# Patient Record
Sex: Female | Born: 1950 | Race: White | Hispanic: No | Marital: Single | Smoking: Never smoker
Health system: Southern US, Community
[De-identification: ages and names within clinical notes are randomized; demographics above are authoritative.]

## PROBLEM LIST (undated history)

## (undated) DIAGNOSIS — K219 Gastro-esophageal reflux disease without esophagitis: Secondary | ICD-10-CM

## (undated) DIAGNOSIS — D369 Benign neoplasm, unspecified site: Secondary | ICD-10-CM

## (undated) DIAGNOSIS — C8400 Mycosis fungoides, unspecified site: Secondary | ICD-10-CM

## (undated) DIAGNOSIS — M8580 Other specified disorders of bone density and structure, unspecified site: Secondary | ICD-10-CM

## (undated) DIAGNOSIS — I10 Essential (primary) hypertension: Secondary | ICD-10-CM

## (undated) DIAGNOSIS — Z8489 Family history of other specified conditions: Secondary | ICD-10-CM

## (undated) DIAGNOSIS — K5730 Diverticulosis of large intestine without perforation or abscess without bleeding: Secondary | ICD-10-CM

## (undated) DIAGNOSIS — C8590 Non-Hodgkin lymphoma, unspecified, unspecified site: Secondary | ICD-10-CM

## (undated) DIAGNOSIS — C801 Malignant (primary) neoplasm, unspecified: Secondary | ICD-10-CM

## (undated) DIAGNOSIS — Z8679 Personal history of other diseases of the circulatory system: Secondary | ICD-10-CM

## (undated) DIAGNOSIS — E785 Hyperlipidemia, unspecified: Secondary | ICD-10-CM

## (undated) DIAGNOSIS — C20 Malignant neoplasm of rectum: Secondary | ICD-10-CM

## (undated) HISTORY — DX: Benign neoplasm, unspecified site: D36.9

## (undated) HISTORY — PX: BREAST BIOPSY: SHX20

## (undated) HISTORY — DX: Personal history of other diseases of the circulatory system: Z86.79

## (undated) HISTORY — DX: Non-Hodgkin lymphoma, unspecified, unspecified site: C85.90

## (undated) HISTORY — DX: Mycosis fungoides, unspecified site: C84.00

## (undated) HISTORY — DX: Malignant neoplasm of rectum: C20

## (undated) HISTORY — DX: Essential (primary) hypertension: I10

## (undated) HISTORY — PX: FOOT SURGERY: SHX648

## (undated) HISTORY — DX: Other specified disorders of bone density and structure, unspecified site: M85.80

---

## 2005-02-09 ENCOUNTER — Emergency Department (HOSPITAL_COMMUNITY): Admission: EM | Admit: 2005-02-09 | Discharge: 2005-02-09 | Payer: Self-pay | Admitting: Family Medicine

## 2005-07-01 ENCOUNTER — Encounter (INDEPENDENT_AMBULATORY_CARE_PROVIDER_SITE_OTHER): Payer: Self-pay | Admitting: Specialist

## 2005-07-01 ENCOUNTER — Ambulatory Visit (HOSPITAL_COMMUNITY): Admission: RE | Admit: 2005-07-01 | Discharge: 2005-07-01 | Payer: Self-pay | Admitting: *Deleted

## 2005-11-13 ENCOUNTER — Emergency Department (HOSPITAL_COMMUNITY): Admission: EM | Admit: 2005-11-13 | Discharge: 2005-11-13 | Payer: Self-pay | Admitting: Emergency Medicine

## 2006-02-11 ENCOUNTER — Emergency Department (HOSPITAL_COMMUNITY): Admission: EM | Admit: 2006-02-11 | Discharge: 2006-02-11 | Payer: Self-pay | Admitting: Family Medicine

## 2006-09-21 ENCOUNTER — Emergency Department (HOSPITAL_COMMUNITY): Admission: EM | Admit: 2006-09-21 | Discharge: 2006-09-21 | Payer: Self-pay | Admitting: Family Medicine

## 2007-06-03 ENCOUNTER — Emergency Department (HOSPITAL_COMMUNITY): Admission: EM | Admit: 2007-06-03 | Discharge: 2007-06-03 | Payer: Self-pay | Admitting: Family Medicine

## 2008-06-23 ENCOUNTER — Ambulatory Visit (HOSPITAL_COMMUNITY): Admission: RE | Admit: 2008-06-23 | Discharge: 2008-06-23 | Payer: Self-pay | Admitting: *Deleted

## 2008-06-23 ENCOUNTER — Encounter (INDEPENDENT_AMBULATORY_CARE_PROVIDER_SITE_OTHER): Payer: Self-pay | Admitting: *Deleted

## 2009-06-13 ENCOUNTER — Emergency Department (HOSPITAL_COMMUNITY): Admission: EM | Admit: 2009-06-13 | Discharge: 2009-06-13 | Payer: Self-pay | Admitting: Emergency Medicine

## 2011-03-22 NOTE — Op Note (Signed)
Alexa [name], Alexa [name]              ACCOUNT NO.:  [id number]   MEDICAL RECORD NO.:  [id number]          PATIENT TYPE:  AMB   LOCATION:  ENDO                         FACILITY:  [facility]   PHYSICIAN:  Georgiana Spinner, M.D.    DATE OF BIRTH:  [birth year]   DATE OF PROCEDURE:  DATE OF DISCHARGE:                               OPERATIVE REPORT   PROCEDURE:  Colonoscopy with polypectomy and biopsy.   INDICATIONS:  Colon polyps.   ANESTHESIA:  Fentanyl 100 mcg, Versed 9 mg.   PROCEDURE:  With the patient mildly sedated in the left lateral  decubitus position the Pentax videoscopic colonoscope was inserted in  the rectum, passed under direct vision to the cecum identified by  ileocecal valve and appendiceal orifice both of which were photographed.  From this point the colonoscope was slowly withdrawn taking  circumferential views of colonic mucosa, stopping at the splenic flexure  where a polyp was seen, photographed and removed using snare cautery  technique setting of 20/150 blended current.  Tissue was retrieved by  suctioning it through the endoscope into a tissue trap.  We next stopped  in the rectum proximally where another polyp was seen.  It too was  photographed and it was removed using hot biopsy forceps technique with  the same setting.  The endoscope was withdrawn to the rectum which  appeared normal and  direct showed hemorrhoids on retroflexed view.  The  endoscope was straightened and withdrawn.  The patient's vital signs and  pulse oximeter remained stable.  The patient tolerated procedure well  without apparent complication.   FINDINGS:  1. Internal hemorrhoids.  2. Polyp of rectum proximally and splenic flexure.  3. Await biopsy report.  4. The patient will call me for results and follow-up with me as an      outpatient.           ______________________________  Georgiana Spinner, M.D.     GMO/MEDQ  D:  06/23/2008  T:  06/23/2008  Job:  [id number]

## 2011-03-25 NOTE — Op Note (Signed)
Alexa [name], Alexa [name]              ACCOUNT NO.:  [id number]   MEDICAL RECORD NO.:  [id number]          PATIENT TYPE:  AMB   LOCATION:  ENDO                         FACILITY:  [facility]   PHYSICIAN:  Georgiana Spinner, M.D.    DATE OF BIRTH:  08/03/1951   DATE OF PROCEDURE:  07/01/2005  DATE OF DISCHARGE:                                 OPERATIVE REPORT   PROCEDURE:  Colonoscopy with biopsy.   INDICATIONS:  Colon polyps.   ANESTHESIA:  Demerol 50, Versed 5 mg.   DESCRIPTION OF PROCEDURE:  With the patient mildly sedated in the left  lateral decubitus position, the Olympus videoscopic colonoscope was inserted  in the rectum and with pressure applied, we reached past the colonoscope  under direct vision to the cecum. The cecum was identified by the ileocecal  valve and appendiceal orifice both of which were photographed. In the cecum  was a polyp that was photographed that was linear and it was removed using  hot biopsy forceps technique in two pieces. The endoscope was then withdrawn  taking circumferential views of the remaining colonic mucosa stopping then  only in the rectum which appeared normal on direct and showed hemorrhoids on  retroflexed view. The endoscope was straightened and withdrawn. The  patient's vital signs and pulse oximeter remained stable. The patient  tolerated the procedure well without apparent complication.   FINDINGS:  Polyp of cecum. Internal hemorrhoids otherwise an unremarkable  examination.   PLAN:  Await biopsy report. The patient will call me for results and follow-  up with me as an outpatient.           ______________________________  Georgiana Spinner, M.D.     GMO/MEDQ  D:  07/01/2005  T:  07/01/2005  Job:  [id number]

## 2011-05-04 ENCOUNTER — Encounter: Payer: Self-pay | Admitting: Gastroenterology

## 2011-06-14 ENCOUNTER — Ambulatory Visit (INDEPENDENT_AMBULATORY_CARE_PROVIDER_SITE_OTHER): Payer: BC Managed Care – PPO | Admitting: Gastroenterology

## 2011-06-14 ENCOUNTER — Encounter: Payer: Self-pay | Admitting: Gastroenterology

## 2011-06-14 VITALS — BP 134/82 | HR 64 | Ht 64.0 in | Wt 149.0 lb

## 2011-06-14 DIAGNOSIS — Z86010 Personal history of colonic polyps: Secondary | ICD-10-CM

## 2011-06-14 NOTE — Progress Notes (Signed)
HPI: This is a  very pleasant, intelligent 60 year old woman who has had 2 previous colonoscopies. These were done by Dr. Virginia Rochester in 2006 he removed a cecal adenoma that was not given measurements on his colonoscopy report. He repeated colonoscopy 3 years later and removed one splenic flexure adenoma with snare cautery. This was also not given measurements on his colonoscopy report but the past biologist size it at less than 1/2 cm. Another small polyp was removed from her colon in 2009 and this was removed by hot biopsy forceps so I expect it was quite small. She was told to have repeat colonoscopy at a 3 year interval. She is here to discuss those recommendations since her previous gastroenterologist has moved from town.Alexa [name]     No FH of colon cancer.  No bowel changes.  Stable weight.      Review of systems: Pertinent positive and negative review of systems were noted in the above HPI section.  All other review of systems was otherwise negative.   Past Medical History  Diagnosis Date  . Adenomatous polyp   . Hypertension     Past Surgical History  Procedure Date  . Foot surgery     Right      reports that she has never smoked. She has never used smokeless tobacco. She reports that she drinks alcohol. She reports that she does not use illicit drugs.  family history is negative for Colon cancer.    Current Medications, Allergies were all reviewed with the patient via [facility] electronic medical record system.    Physical Exam: BP 134/82  Pulse 64  Ht 5\' 4"  (1.626 m)  Wt 149 lb (67.586 kg)  BMI 25.58 kg/m2 Constitutional: generally well-appearing Psychiatric: alert and oriented x3 Eyes: extraocular movements intact Mouth: oral pharynx moist, no lesions Neck: supple no lymphadenopathy Cardiovascular: heart regular rate and rhythm Lungs: clear to auscultation bilaterally Abdomen: soft, nontender, nondistended, no obvious ascites, no peritoneal signs, normal bowel  sounds Extremities: no lower extremity edema bilaterally Skin: no lesions on visible extremities    Assessment and plan: 60 y.o. female wiPersonal history of precancerous, adenomatous polyps  Current national except at polyp surveillance guidelines recommend repeat colonoscopy at 5 year interval for people with 2 or less subcentimeter adenomatous polyps. She falls into this category quite nicely and we discussed those guidelines. She is very bright, inquisitive, and does understand the rationale for the polyp surveillance guidelines and agrees for repeat colonoscopy at 5 year interval instead of 3. She does note at that assumes she is having no GI symptoms, currently that is the case but she will call if she has any new troubles.

## 2011-06-14 NOTE — Patient Instructions (Signed)
You need repeat colonoscopy August 2014 for pre-cancerous polyps (2009, 2006). A copy of this information will be made available to Dr. Ricki Miller. Please call with any new new GI changes or symptoms.

## 2011-09-19 ENCOUNTER — Institutional Professional Consult (permissible substitution): Payer: BC Managed Care – PPO | Admitting: Cardiology

## 2011-09-26 ENCOUNTER — Encounter: Payer: Self-pay | Admitting: Cardiology

## 2011-09-26 ENCOUNTER — Ambulatory Visit (INDEPENDENT_AMBULATORY_CARE_PROVIDER_SITE_OTHER): Payer: BC Managed Care – PPO | Admitting: Cardiology

## 2011-09-26 VITALS — BP 132/80 | HR 57 | Ht 63.5 in | Wt 151.0 lb

## 2011-09-26 DIAGNOSIS — R0789 Other chest pain: Secondary | ICD-10-CM

## 2011-09-26 DIAGNOSIS — R002 Palpitations: Secondary | ICD-10-CM | POA: Insufficient documentation

## 2011-09-26 NOTE — Assessment & Plan Note (Signed)
Symptoms are consistent with occasional PACs or PVCs. No sustained tachycardia. Given her normal stress test these are benign. I would recommend continuing on her low-dose of beta blocker. If she has worsening symptoms of palpitations we could always have her wear an event monitor. Otherwise I'll see her back as needed.

## 2011-09-26 NOTE — Patient Instructions (Signed)
Continue your current therapy.  Avoid caffeine and decongestants.    I will be happy to see you as needed for worsening symptoms.

## 2011-09-26 NOTE — Progress Notes (Signed)
[employer] Date of Birth: 1951/07/31 Medical Record #[id number]  History of Present Illness: Alexa [name] is seen to establish cardiac care. She is a 60 year old white female who in general has been in excellent health. She does have a history of mild hypertension. She also has some family history of coronary disease but both parents lived to be in their mid 31s. She reports a history of intermittent atypical chest pain. She had a stress Myoview study one year ago which was normal. She also has experienced symptoms of palpitations over the past 6 years. These occur less than once every 3 months. It may awaken her from sleep. She describes this as a missed or skipped beat. There is no tachycardia. She's had no dizziness or syncope. She was placed on a low-dose of beta blocker before and this did seem to help some. She avoids caffeine and decongestants.  Current Outpatient Prescriptions on File Prior to Visit  Medication Sig Dispense Refill  . metoprolol tartrate (LOPRESSOR) 25 MG tablet Take 25 mg by mouth daily.        . Multiple Vitamin (MULTIVITAMIN PO) Take 1 tablet by mouth daily.        . Omega-3 Fatty Acids (FISH OIL) 1000 MG CAPS Take 1 capsule by mouth daily.          No Known Allergies  Past Medical History  Diagnosis Date  . Adenomatous polyp   . Hypertension     Past Surgical History  Procedure Date  . Foot surgery     Right   . Colonoscopy      with biopsy    History  Smoking status  . Never Smoker   Smokeless tobacco  . Never Used    History  Alcohol Use  . Yes    rare    Family History  Problem Relation Age of Onset  . Colon cancer Neg Hx   . Anuerysm Father   . Heart disease Father   . Heart attack Mother   . Coronary artery disease Brother   . Hypertension Sister   . Hypertension Sister   . Cancer Brother     Liver  . Heart attack Brother     Review of Systems: As noted in history of present illness..  All other systems were reviewed and are  negative.  Physical Exam: BP 132/80  Pulse 57  Ht 5' 3.5" (1.613 m)  Wt 151 lb (68.493 kg)  BMI 26.33 kg/m2 The patient is alert and oriented x 3.  The mood and affect are normal.  The skin is warm and dry.  Color is normal.  The HEENT exam reveals that the sclera are nonicteric.  The mucous membranes are moist.  The carotids are 2+ without bruits.  There is no thyromegaly.  There is no JVD.  The lungs are clear.  The chest wall is non tender.  The heart exam reveals a regular rate with a normal S1 and S2.  There are no murmurs, gallops, or rubs.  The PMI is not displaced.   Abdominal exam reveals good bowel sounds.  There is no guarding or rebound.  There is no hepatosplenomegaly or tenderness.  There are no masses.  Exam of the legs reveal no clubbing, cyanosis, or edema.  The legs are without rashes.  The distal pulses are intact.  Cranial nerves II - XII are intact.  Motor and sensory functions are intact.  The gait is normal.  LABORATORY DATA: Recent CBC and chemistry panel  were normal. Total cholesterol 196.  Assessment / Plan:

## 2011-09-26 NOTE — Assessment & Plan Note (Signed)
Normal stress Cardiolite study 1 year ago. Her cardiovascular risk is really quite low with only risk factor being mild hypertension which is well-controlled. She really has no family history of premature coronary disease.

## 2012-02-29 ENCOUNTER — Telehealth: Payer: Self-pay | Admitting: Gastroenterology

## 2012-02-29 NOTE — Telephone Encounter (Signed)
For the last 2 months the pt has had a change in bowel movements. Her past 2 colon's she had adenomatous polyps.  They have become small in size and she feels like she can't empty her bowel completely. Her bowels are normally everyday and normal caliber.  Now they are not everyday and she is very gassy.  She has an appointment next Friday and wants to know if she can use Miralax daily until her appt.  I advised her that she could use the miralax and I would send to Dr Christella Hartigan to ok

## 2012-03-01 NOTE — Telephone Encounter (Signed)
That is ok, thanks

## 2012-03-07 HISTORY — PX: COLONOSCOPY: SHX174

## 2012-03-09 ENCOUNTER — Ambulatory Visit (INDEPENDENT_AMBULATORY_CARE_PROVIDER_SITE_OTHER): Payer: BC Managed Care – PPO | Admitting: Gastroenterology

## 2012-03-09 ENCOUNTER — Encounter: Payer: Self-pay | Admitting: Gastroenterology

## 2012-03-09 VITALS — BP 140/72 | HR 60 | Ht 64.0 in | Wt 153.0 lb

## 2012-03-09 DIAGNOSIS — R194 Change in bowel habit: Secondary | ICD-10-CM

## 2012-03-09 DIAGNOSIS — R198 Other specified symptoms and signs involving the digestive system and abdomen: Secondary | ICD-10-CM

## 2012-03-09 MED ORDER — BISACODYL 5 MG PO TBEC: DELAYED_RELEASE_TABLET | ORAL | Status: DC

## 2012-03-09 MED ORDER — POLYETHYLENE GLYCOL 3350 17 GM/SCOOP PO POWD: 17.0000 g | Freq: Every day | ORAL | Status: AC

## 2012-03-09 MED ORDER — METOCLOPRAMIDE HCL 10 MG PO TABS: ORAL_TABLET | ORAL | Status: DC

## 2012-03-09 NOTE — Patient Instructions (Addendum)
You have been scheduled for a colonoscopy. Please follow written instructions given to you at your visit today.  Please pick up your prep ingredients at the pharmacy within the next 1-3 days.

## 2012-03-09 NOTE — Progress Notes (Signed)
Review of pertinent gastrointestinal problems: 1.  adenomatous colon polyps;colonoscopy   Dr. Virginia Rochester in 2006 he removed a cecal adenoma that was not given measurements on his colonoscopy report. He repeated colonoscopy 3 years later and removed one splenic flexure adenoma with snare cautery. This was also not given measurements on his colonoscopy report but the pathologist sized it at less than 1/2 cm. Another small polyp was removed from her colon in 2009 and this was removed by hot biopsy forceps so I expect it was quite small. She was told to have repeat colonoscopy at a 3 years  however in 2011 we discussed this and decided to put her at a 5 year interval based on current, national excepted Polyp surveillance guidelines.    HPI: This is a very pleasant, intelligent 61 year old woman whom I last saw a year or 2 ago.    2 months ago pretty signiifcant, acute change.  She went for a message and during foot message her reflexologist told her she had a blockage and the next day she had looser stools than usual.  Had hemorrhoidal burning.  Stools began to change character, more narrow stool, felt unable to completely evacuate.  She is a bit more back to usual but still has definite CHANGE in her bowel.  She tried align for a week and magnesium trial, but no change in bowel movements. Has felt a bit crampy in abdomen.  No overt GI bleeding.  No change in weights.    No real changed in exercise or diet.    Past Medical History  Diagnosis Date  . Adenomatous polyp   . Hypertension     Past Surgical History  Procedure Date  . Foot surgery     Right   . Colonoscopy      with biopsy    Current Outpatient Prescriptions  Medication Sig Dispense Refill  . metoprolol tartrate (LOPRESSOR) 25 MG tablet Take 25 mg by mouth daily.        . Multiple Vitamin (MULTIVITAMIN PO) Take 1 tablet by mouth daily.        . Omega-3 Fatty Acids (FISH OIL) 1000 MG CAPS Take 1 capsule by mouth daily.           Allergies as of 03/09/2012  . (No Known Allergies)    Family History  Problem Relation Age of Onset  . Colon cancer Neg Hx   . Anuerysm Father   . Heart disease Father   . Heart attack Mother   . Coronary artery disease Brother   . Hypertension Sister   . Hypertension Sister   . Cancer Brother     Liver  . Heart attack Brother     History   Social History  . Marital Status: Single    Spouse Name: N/A    Number of Children: N/A  . Years of Education: N/A   Occupational History  . Not on file.   Social History Main Topics  . Smoking status: Never Smoker   . Smokeless tobacco: Never Used  . Alcohol Use: Yes     rare  . Drug Use: No  . Sexually Active: Not on file   Other Topics Concern  . Not on file   Social History Narrative  . No narrative on file      Physical Exam: BP 140/72  Pulse 60  Ht 5\' 4"  (1.626 m)  Wt 153 lb (69.4 kg)  BMI 26.26 kg/m2 Constitutional: generally well-appearing Psychiatric: alert and oriented x3  Abdomen: soft, nontender, nondistended, no obvious ascites, no peritoneal signs, normal bowel sounds     Assessment and plan: 61 y.o. female with  change in her bowel habits  we should proceed with colonoscopy at her soonest convenience. She has a history of precancerous colon polyps and now a definite change in her bowel habits. Her last colonoscopy was 35 years ago.

## 2012-03-12 ENCOUNTER — Encounter: Payer: Self-pay | Admitting: Gastroenterology

## 2012-03-15 ENCOUNTER — Encounter: Payer: Self-pay | Admitting: Gastroenterology

## 2012-03-15 NOTE — Telephone Encounter (Signed)
Error

## 2012-03-22 ENCOUNTER — Telehealth: Payer: Self-pay | Admitting: Gastroenterology

## 2012-03-22 NOTE — Telephone Encounter (Signed)
The pt is calling because she has heart burn type soreness around her breast bone. She has had this before, no other symptoms or fever.   She was advised to keep her appointment for her colon tomorrow.  I will forward to Dr Christella Hartigan for review.

## 2012-03-22 NOTE — Telephone Encounter (Signed)
I agree, should proceed with colonsocopy.  Take PPI or H2 blocker (otc if she doesn't have one).

## 2012-03-23 ENCOUNTER — Encounter: Payer: Self-pay | Admitting: Gastroenterology

## 2012-03-23 ENCOUNTER — Ambulatory Visit (AMBULATORY_SURGERY_CENTER): Payer: BC Managed Care – PPO | Admitting: Gastroenterology

## 2012-03-23 VITALS — HR 62 | Temp 99.0°F | Resp 18 | Ht 64.0 in | Wt 153.0 lb

## 2012-03-23 DIAGNOSIS — R198 Other specified symptoms and signs involving the digestive system and abdomen: Secondary | ICD-10-CM

## 2012-03-23 DIAGNOSIS — D126 Benign neoplasm of colon, unspecified: Secondary | ICD-10-CM

## 2012-03-23 DIAGNOSIS — R194 Change in bowel habit: Secondary | ICD-10-CM

## 2012-03-23 DIAGNOSIS — Z86010 Personal history of colonic polyps: Secondary | ICD-10-CM

## 2012-03-23 MED ORDER — SODIUM CHLORIDE 0.9 % IV SOLN: 500.0000 mL | INTRAVENOUS | Status: DC

## 2012-03-23 NOTE — Op Note (Signed)
[facility] [address] [place], [place]  [address]  COLONOSCOPY PROCEDURE REPORT  PATIENT:  [name], [name]  MR#:  [id number] BIRTHDATE:  09-11-1951, 61 yrs. old  GENDER:  female ENDOSCOPIST:  Rachael Fee, MD REF. BY: PROCEDURE DATE:  03/23/2012 PROCEDURE:  Colonoscopy with snare polypectomy ASA CLASS:  Class II INDICATIONS:  Dr. Virginia Rochester in 2006 he removed a cecal adenoma. He repeated colonoscopy 3 years later and removed one splenic flexure adenoma with snare cautery,  pathologist sized it at less than 1/2 cm. Another small polyp removed from her colon in 2009 by hot biopsy forceps. She was told to have repeat colonoscopy at a 3 years however in 2011 we discussed this and decided to put her at a 5 year interval MEDICATIONS:   Fentanyl 100 mcg IV, These medications were titrated to patient response per physician's verbal order, Versed 10 mg IV  DESCRIPTION OF PROCEDURE:   After the risks benefits and alternatives of the procedure were thoroughly explained, informed consent was obtained.  Digital rectal exam was performed and revealed no rectal masses.   The LB [id number]-[id number] endoscope was introduced through the anus and advanced to the cecum, which was identified by both the appendix and ileocecal valve, without limitations.  The quality of the prep was good..  The instrument was then slowly withdrawn as the colon was fully examined. <<PROCEDUREIMAGES>> FINDINGS:  A diminutive polyp was found in the transverse colon. This was removed with cold snare, sent to pathology (jar 1) (see image4).  Mild diverticulosis was found in the sigmoid to descending colon segments (see image1).  This was otherwise a normal examination of the colon (see image2, image3, and image5). Retroflexed views in the rectum revealed no abnormalities. COMPLICATIONS:  None  ENDOSCOPIC IMPRESSION: 1) Diminutive polyp in the transverse colon, removed and sent to pathology 2) Mild  diverticulosis in the sigmoid to descending colon segments 3) Otherwise normal examination  RECOMMENDATIONS: 1) Given your personal history of adenomatous (pre-cancerous) polyps, you will need a repeat colonoscopy in 5 years even if the polyp removed today is not pre-cancerous 2) You will receive a letter within 1-2 weeks with the results of your biopsy as well as final recommendations. Please call my office if you have not received a letter after 3 weeks.  ______________________________ Rachael Fee, MD  n. eSIGNED:   Rachael Fee at 03/23/2012 03:30 PM  [place], [place], [id number]

## 2012-03-23 NOTE — Patient Instructions (Signed)
Discharge instructions given with verbal understanding. Handouts on polyps, and diverticulosis given. Resume previous medications.YOU HAD AN ENDOSCOPIC PROCEDURE TODAY AT THE [facility]: Refer to the procedure report that was given to you for any specific questions about what was found during the examination.  If the procedure report does not answer your questions, please call your gastroenterologist to clarify.  If you requested that your care partner not be given the details of your procedure findings, then the procedure report has been included in a sealed envelope for you to review at your convenience later.  YOU SHOULD EXPECT: Some feelings of bloating in the abdomen. Passage of more gas than usual.  Walking can help get rid of the air that was put into your GI tract during the procedure and reduce the bloating. If you had a lower endoscopy (such as a colonoscopy or flexible sigmoidoscopy) you may notice spotting of blood in your stool or on the toilet paper. If you underwent a bowel prep for your procedure, then you may not have a normal bowel movement for a few days.  DIET: Your first meal following the procedure should be a light meal and then it is ok to progress to your normal diet.  A half-sandwich or bowl of soup is an example of a good first meal.  Heavy or fried foods are harder to digest and may make you feel nauseous or bloated.  Likewise meals heavy in dairy and vegetables can cause extra gas to form and this can also increase the bloating.  Drink plenty of fluids but you should avoid alcoholic beverages for 24 hours.  ACTIVITY: Your care partner should take you home directly after the procedure.  You should plan to take it easy, moving slowly for the rest of the day.  You can resume normal activity the day after the procedure however you should NOT DRIVE or use heavy machinery for 24 hours (because of the sedation medicines used during the test).    SYMPTOMS TO REPORT  IMMEDIATELY: A gastroenterologist can be reached at any hour.  During normal business hours, 8:30 AM to 5:00 PM Monday through Friday, call [phone].  After hours and on weekends, please call the GI answering service at [phone] who will take a message and have the physician on call contact you.   Following lower endoscopy (colonoscopy or flexible sigmoidoscopy):  Excessive amounts of blood in the stool  Significant tenderness or worsening of abdominal pains  Swelling of the abdomen that is new, acute  Fever of 100F or higher  FOLLOW UP: If any biopsies were taken you will be contacted by phone or by letter within the next 1-3 weeks.  Call your gastroenterologist if you have not heard about the biopsies in 3 weeks.  Our staff will call the home number listed on your records the next business day following your procedure to check on you and address any questions or concerns that you may have at that time regarding the information given to you following your procedure. This is a courtesy call and so if there is no answer at the home number and we have not heard from you through the emergency physician on call, we will assume that you have returned to your regular daily activities without incident.  SIGNATURES/CONFIDENTIALITY: You and/or your care partner have signed paperwork which will be entered into your electronic medical record.  These signatures attest to the fact that that the information above on your After Visit Summary  has been reviewed and is understood.  Full responsibility of the confidentiality of this discharge information lies with you and/or your care-partner.  

## 2012-03-26 ENCOUNTER — Telehealth: Payer: Self-pay | Admitting: *Deleted

## 2012-03-26 NOTE — Telephone Encounter (Signed)
  Follow up Call-  Call back number 03/23/2012  Post procedure Call Back phone  # [phone]  Permission to leave phone message Yes     Patient questions:  Message left to call us if necessary.

## 2012-04-03 ENCOUNTER — Encounter: Payer: Self-pay | Admitting: Gastroenterology

## 2012-05-08 ENCOUNTER — Ambulatory Visit (INDEPENDENT_AMBULATORY_CARE_PROVIDER_SITE_OTHER): Payer: BC Managed Care – PPO | Admitting: Nurse Practitioner

## 2012-05-08 ENCOUNTER — Encounter: Payer: Self-pay | Admitting: Nurse Practitioner

## 2012-05-08 ENCOUNTER — Telehealth: Payer: Self-pay | Admitting: Gastroenterology

## 2012-05-08 VITALS — BP 150/78 | HR 60 | Ht 64.0 in | Wt 148.2 lb

## 2012-05-08 DIAGNOSIS — R1310 Dysphagia, unspecified: Secondary | ICD-10-CM

## 2012-05-08 NOTE — Progress Notes (Signed)
Alexa [name] [id number] 03-16-1951   HISTORY OR PRESENT ILLNESS :  Patient is a 61 year old female known to Dr. Christella Hartigan for a history of adenomatous colon polyps. She was last seen early May of this year for bowel habit change. Colonoscopy subsequent to that visit pertinent for a small polyp in the transverse colon and mild diverticulosis. Repeat colonoscopy recommended at 5 year mark. Colon pathology compatible with tubular adenoma.   Three weeks ago patient began feeling like something was getting caught in her esophagus when swallowing. In the last couple of days this has progressed to the point where she is having chest discomfort. Fluids go down okay . No heartburn. Breastbone is even tender for her to touch. No history of significant GERD symptoms. In the last month she has been taking ani-inflammatories. No recent antibiotics. She doesn't use inhalers.   Current Medications, Allergies, Past Medical History, Past Surgical History, Family History and Social History were reviewed in [employer] record.   PHYSICAL EXAMINATION : General:  Well developed  female in no acute distress Head: Normocephalic and atraumatic Eyes:  sclerae anicteric,conjunctive pink. Ears: Normal auditory acuity Neck: Supple, no masses.  Lungs: Clear throughout to auscultation Heart: Regular rate and rhythm; no murmurs heard Abdomen: Soft, nondistended, nontender. No masses or hepatomegaly noted. Normal bowel sounds Musculoskeletal: Symmetrical with no gross deformities. Localized tenderness over sternum at between breasts. Skin: No lesions on visible extremities Extremities: No edema or deformities noted Neurological: Oriented x 4, grossly nonfocal Cervical Nodes:  No significant cervical adenopathy Psychological:  Alert and cooperative. Normal mood and affect  ASSESSMENT AND PLAN :  Solid food dysphagia. Rule out esophagitis, dysmotility, stricture. Patient scared to eat solids. For  further evaluation she will have an upper endoscopy. The benefits, risks, and potential complications of EGD with possible biopsies and/or dilation were discussed with the patient and she agrees to proceed. She is going out of town this week. Fortunately Dr. Christella Hartigan has availability to do the EGD tomorrow. In meantime I gave her some samples of a PPI to start.

## 2012-05-08 NOTE — Telephone Encounter (Signed)
Pt is having difficulty swallowing and is leaving for vacation tomorrow and is worried because her mother had similar symptoms and had to go the ER and have a "procedure" done.  Pt was added to [employer] schedule for today at 330 pm

## 2012-05-08 NOTE — Patient Instructions (Addendum)
We have scheduled the Endoscopy with Dr. Christella Hartigan for tomorrow, 05-09-2012. Directions provided. We have given you samples of Prilosec PTC 20 mg, take 1 capsule this evening before bed and 1 in the AM before your clear liquid breakfast.

## 2012-05-09 ENCOUNTER — Ambulatory Visit (AMBULATORY_SURGERY_CENTER): Payer: BC Managed Care – PPO | Admitting: Gastroenterology

## 2012-05-09 ENCOUNTER — Encounter: Payer: Self-pay | Admitting: Gastroenterology

## 2012-05-09 VITALS — BP 123/69 | HR 73 | Temp 99.0°F | Resp 18 | Ht 64.0 in | Wt 148.0 lb

## 2012-05-09 DIAGNOSIS — R1310 Dysphagia, unspecified: Secondary | ICD-10-CM | POA: Insufficient documentation

## 2012-05-09 DIAGNOSIS — K219 Gastro-esophageal reflux disease without esophagitis: Secondary | ICD-10-CM

## 2012-05-09 MED ORDER — SODIUM CHLORIDE 0.9 % IV SOLN: 500.0000 mL | INTRAVENOUS | Status: DC

## 2012-05-09 NOTE — Progress Notes (Signed)
Pt. Has taken two doses of Prilosec since receiving samples yesterday in Dr. Larae Grooms office.   Pt. States that she has already noted signifigant relief.  Did not receive a presciption.

## 2012-05-09 NOTE — Progress Notes (Signed)
Patient did not experience any of the following events: a burn prior to discharge; a fall within the facility; wrong site/side/patient/procedure/implant event; or a hospital transfer or hospital admission upon discharge from the facility. (G8907) Patient did not have preoperative order for IV antibiotic SSI prophylaxis. (G8918)  

## 2012-05-09 NOTE — Patient Instructions (Addendum)
Discharge instructions given with verbal understanding. Handout on esophagitis given. Resume previous medications. YOU HAD AN ENDOSCOPIC PROCEDURE TODAY AT THE [facility]: Refer to the procedure report that was given to you for any specific questions about what was found during the examination.  If the procedure report does not answer your questions, please call your gastroenterologist to clarify.  If you requested that your care partner not be given the details of your procedure findings, then the procedure report has been included in a sealed envelope for you to review at your convenience later.  YOU SHOULD EXPECT: Some feelings of bloating in the abdomen. Passage of more gas than usual.  Walking can help get rid of the air that was put into your GI tract during the procedure and reduce the bloating. If you had a lower endoscopy (such as a colonoscopy or flexible sigmoidoscopy) you may notice spotting of blood in your stool or on the toilet paper. If you underwent a bowel prep for your procedure, then you may not have a normal bowel movement for a few days.  DIET: Your first meal following the procedure should be a light meal and then it is ok to progress to your normal diet.  A half-sandwich or bowl of soup is an example of a good first meal.  Heavy or fried foods are harder to digest and may make you feel nauseous or bloated.  Likewise meals heavy in dairy and vegetables can cause extra gas to form and this can also increase the bloating.  Drink plenty of fluids but you should avoid alcoholic beverages for 24 hours.  ACTIVITY: Your care partner should take you home directly after the procedure.  You should plan to take it easy, moving slowly for the rest of the day.  You can resume normal activity the day after the procedure however you should NOT DRIVE or use heavy machinery for 24 hours (because of the sedation medicines used during the test).    SYMPTOMS TO REPORT IMMEDIATELY: A  gastroenterologist can be reached at any hour.  During normal business hours, 8:30 AM to 5:00 PM Monday through Friday, call [phone].  After hours and on weekends, please call the GI answering service at [phone] who will take a message and have the physician on call contact you.   Following upper endoscopy (EGD)  Vomiting of blood or coffee ground material  New chest pain or pain under the shoulder blades  Painful or persistently difficult swallowing  New shortness of breath  Fever of 100F or higher  Black, tarry-looking stools  FOLLOW UP: If any biopsies were taken you will be contacted by phone or by letter within the next 1-3 weeks.  Call your gastroenterologist if you have not heard about the biopsies in 3 weeks.  Our staff will call the home number listed on your records the next business day following your procedure to check on you and address any questions or concerns that you may have at that time regarding the information given to you following your procedure. This is a courtesy call and so if there is no answer at the home number and we have not heard from you through the emergency physician on call, we will assume that you have returned to your regular daily activities without incident.  SIGNATURES/CONFIDENTIALITY: You and/or your care partner have signed paperwork which will be entered into your electronic medical record.  These signatures attest to the fact that that the information above on   your After Visit Summary has been reviewed and is understood.  Full responsibility of the confidentiality of this discharge information lies with you and/or your care-partner.  

## 2012-05-09 NOTE — Op Note (Signed)
[facility] [address] [place], [place]  [address]  ENDOSCOPY PROCEDURE REPORT  PATIENT:  [name], [name]  MR#:  [id number] BIRTHDATE:  Jan 10, 1951, 61 yrs. old  GENDER:  female ENDOSCOPIST:  Rachael Fee, MD PROCEDURE DATE:  05/09/2012 PROCEDURE:  EGD, diagnostic [phone] ASA CLASS:  Class II INDICATIONS:  dysphagia  MEDICATIONS:   Fentanyl 25 mcg IV, These medications were titrated to patient response per physician's verbal order, Versed 5 mg IV TOPICAL ANESTHETIC:  Cetacaine Spray  DESCRIPTION OF PROCEDURE:   After the risks benefits and alternatives of the procedure were thoroughly explained, informed consent was obtained.  The LB [id number] endoscope was introduced through the mouth and advanced to the second portion of the duodenum, without limitations.  The instrument was slowly withdrawn as the mucosa was fully examined. <<PROCEDUREIMAGES>> The upper, middle, and distal third of the esophagus were carefully inspected and no abnormalities were noted. The z-line was well seen at the GEJ. The endoscope was pushed into the fundus which was normal including a retroflexed view. The antrum,gastric body, first and second part of the duodenum were unremarkable (see image1, image2, image3, image4, image5, and image7). Retroflexed views revealed no abnormalities.    The scope was then withdrawn from the patient and the procedure completed. COMPLICATIONS:  None  ENDOSCOPIC IMPRESSION: 1) Normal EGD 2) No esophageal strictures, esophagitis  RECOMMENDATIONS: Since it seems to be helping your sypmtoms, you should continue once daily prilosec (best taken 20-30 min prior to breakfast meal). Please call to report on your syptoms in 3-4 weeks.  ______________________________ Rachael Fee, MD  n. eSIGNED:   Rachael Fee at 05/09/2012 03:36 PM  [name], Alexa, [phone]

## 2012-05-11 ENCOUNTER — Telehealth: Payer: Self-pay | Admitting: *Deleted

## 2012-05-11 NOTE — Telephone Encounter (Signed)
  Follow up Call-  Call back number 05/09/2012 03/23/2012  Post procedure Call Back phone  # [phone]  Permission to leave phone message Yes Yes     Patient questions:  Do you have a fever, pain , or abdominal swelling? no Pain Score  0 *  Have you tolerated food without any problems? yes  Have you been able to return to your normal activities? yes  Do you have any questions about your discharge instructions: Diet   no Medications  no Follow up visit  no  Do you have questions or concerns about your Care? no  Actions: * If pain score is 4 or above: No action needed, pain <4.   Pt asked a few questions about procedure and continuing prilosec since pt was out of town and had left her procedure report @ home ;therefore , reviewed report with pt.

## 2012-05-14 NOTE — Progress Notes (Signed)
I AGREE WITH ASSESSMENT AND PLANS 

## 2012-05-16 ENCOUNTER — Ambulatory Visit: Payer: BC Managed Care – PPO | Admitting: Gastroenterology

## 2012-06-04 ENCOUNTER — Other Ambulatory Visit: Payer: Self-pay | Admitting: Obstetrics & Gynecology

## 2012-06-04 DIAGNOSIS — Z1231 Encounter for screening mammogram for malignant neoplasm of breast: Secondary | ICD-10-CM

## 2012-06-21 ENCOUNTER — Ambulatory Visit (HOSPITAL_COMMUNITY)
Admission: RE | Admit: 2012-06-21 | Discharge: 2012-06-21 | Disposition: A | Discharge status: Home / Self Care | Payer: BC Managed Care – PPO | Source: Ambulatory Visit | Attending: Obstetrics & Gynecology | Admitting: Obstetrics & Gynecology

## 2012-06-21 DIAGNOSIS — Z1231 Encounter for screening mammogram for malignant neoplasm of breast: Secondary | ICD-10-CM | POA: Insufficient documentation

## 2012-06-25 ENCOUNTER — Telehealth (HOSPITAL_COMMUNITY): Payer: Self-pay | Admitting: *Deleted

## 2012-06-25 NOTE — ED Notes (Signed)
Pt. called on VM @ 1114 and said she was bit by cat Fri. night.  States she was here 3 yrs ago and asked if she got a tetanus shot then. Chart accessed and pt. did get a DT on 06/13/2009.  I called @ 1308 and left her a message that she did get one.  I asked her to call me back and leave me a message, so I know she got the information. She called back @ 1318 and said she got it. Vassie Moselle 06/25/2012

## 2013-05-24 ENCOUNTER — Other Ambulatory Visit: Payer: Self-pay | Admitting: Obstetrics & Gynecology

## 2013-05-24 DIAGNOSIS — Z1231 Encounter for screening mammogram for malignant neoplasm of breast: Secondary | ICD-10-CM

## 2013-06-25 ENCOUNTER — Ambulatory Visit (HOSPITAL_COMMUNITY)
Admission: RE | Admit: 2013-06-25 | Discharge: 2013-06-25 | Disposition: A | Discharge status: Home / Self Care | Payer: BC Managed Care – PPO | Source: Ambulatory Visit | Attending: Obstetrics & Gynecology | Admitting: Obstetrics & Gynecology

## 2013-06-25 DIAGNOSIS — Z1231 Encounter for screening mammogram for malignant neoplasm of breast: Secondary | ICD-10-CM

## 2013-08-06 ENCOUNTER — Telehealth: Payer: Self-pay | Admitting: Gastroenterology

## 2013-08-06 NOTE — Telephone Encounter (Signed)
Pt has been scheduled to see Dr Christella Hartigan tomorrow 345 pm she is aware

## 2013-08-07 ENCOUNTER — Ambulatory Visit: Payer: BC Managed Care – PPO | Admitting: Gastroenterology

## 2013-09-06 ENCOUNTER — Ambulatory Visit: Payer: Self-pay | Admitting: Obstetrics & Gynecology

## 2013-09-12 ENCOUNTER — Ambulatory Visit: Payer: Self-pay | Admitting: Obstetrics & Gynecology

## 2013-09-23 ENCOUNTER — Encounter: Payer: Self-pay | Admitting: Obstetrics & Gynecology

## 2013-09-24 ENCOUNTER — Encounter: Payer: Self-pay | Admitting: Obstetrics & Gynecology

## 2013-09-24 ENCOUNTER — Ambulatory Visit (INDEPENDENT_AMBULATORY_CARE_PROVIDER_SITE_OTHER): Payer: BC Managed Care – PPO | Admitting: Obstetrics & Gynecology

## 2013-09-24 VITALS — BP 124/78 | HR 60 | Resp 16 | Ht 63.0 in | Wt 145.4 lb

## 2013-09-24 DIAGNOSIS — R102 Pelvic and perineal pain: Secondary | ICD-10-CM

## 2013-09-24 DIAGNOSIS — Z01419 Encounter for gynecological examination (general) (routine) without abnormal findings: Secondary | ICD-10-CM

## 2013-09-24 DIAGNOSIS — Z0000 Encounter for general adult medical examination without abnormal findings: Secondary | ICD-10-CM

## 2013-09-24 DIAGNOSIS — N9489 Other specified conditions associated with female genital organs and menstrual cycle: Secondary | ICD-10-CM

## 2013-09-24 DIAGNOSIS — E2839 Other primary ovarian failure: Secondary | ICD-10-CM

## 2013-09-24 LAB — POCT URINALYSIS DIPSTICK
Bilirubin, UA: NEGATIVE
Glucose, UA: NEGATIVE
Ketones, UA: NEGATIVE
Nitrite, UA: NEGATIVE
Protein, UA: NEGATIVE
Urobilinogen, UA: NEGATIVE
pH, UA: 5

## 2013-09-24 LAB — HEMOGLOBIN, FINGERSTICK: Hemoglobin, fingerstick: 14.4 g/dL (ref 12.0–16.0)

## 2013-09-24 NOTE — Progress Notes (Signed)
62 y.o. G1P0 SingleCaucasianF here for annual exam.  Wants to switch from Dr. Ricki [name] for PCP.  Having some abdominal issues for about a year.  Bowel habits changed about a year ago.  Referred to Dr. Christella [name], GI, for colonoscopy.  One polyp was seen.  She felt she shrugged her off.  Endoscopy was done and that was normal.  Bowel movements are some better.  Feeling pressure down low in pelvis.  Doesn't feel like she is emptying completely when she has a bowel movement.  Size of stool is thinner.    No bleeding.  No vaginal discharge.     Patient's last menstrual period was 11/07/2002.          Sexually active: no  The current method of family planning is none.    Exercising: yes  yoga, cardio, and resistance Smoker:  no  Health Maintenance: Pap:  09/05/12 WNL/negative HR HPV History of abnormal Pap:  yes MMG:  06/25/13 normal Colonoscopy:  03/23/12 repeat in 5 years, Dr. Christella [name], F/U 5  yrs BMD:   none TDaP:  2011 Screening Labs: Dr. Ricki [name], Hb today: 14.4, Urine today: WBC-1+, RBC-trace   reports that she has never smoked. She has never used smokeless tobacco. She reports that she drinks alcohol. She reports that she does not use illicit drugs.  Past Medical History  Diagnosis Date  . Adenomatous polyp   . Hypertension     off medication    Past Surgical History  Procedure Laterality Date  . Foot surgery      Right   . Colonoscopy       with biopsy    Current Outpatient Prescriptions  Medication Sig Dispense Refill  . Multiple Vitamin (MULTIVITAMIN PO) Take 1 tablet by mouth daily.        . Omega-3 Fatty Acids (FISH OIL) 1000 MG CAPS Take 1 capsule by mouth daily.         No current facility-administered medications for this visit.    Family History  Problem Relation Age of Onset  . Colon cancer Neg Hx   . Anuerysm Father   . Heart disease Father   . Heart attack Mother   . Heart disease Mother   . Kidney disease Mother   . Coronary artery disease Brother   . Hypertension  Sister   . Hypertension Sister   . Cancer Brother     Liver/mets from melanoma  . Heart attack Brother   . Diabetes Father   . Kidney cancer Mother     contained tumor  . Ovarian cancer Paternal Grandmother   . Cancer Father     unknown type  . Hypertension Brother   . Hypertension Mother   . Hypertension Father     ROS:  Pertinent items are noted in HPI.  Otherwise, a comprehensive ROS was negative.  Exam:   BP 124/78  Pulse 60  Resp 16  Ht 5\' 3"  (1.6 m)  Wt 145 lb 6.4 oz (65.953 kg)  BMI 25.76 kg/m2  LMP 11/07/2002  Weight change: -5lb  Height: 5\' 3"  (160 cm)  Ht Readings from Last 3 Encounters:  09/24/13 5\' 3"  (1.6 m)  05/09/12 5\' 4"  (1.626 m)  05/08/12 5\' 4"  (1.626 m)    General appearance: alert, cooperative and appears stated age Head: Normocephalic, without obvious abnormality, atraumatic Neck: no adenopathy, supple, symmetrical, trachea midline and thyroid normal to inspection and palpation Lungs: clear to auscultation bilaterally Breasts: normal appearance, no masses or tenderness Heart:  regular rate and rhythm Abdomen: soft, non-tender; bowel sounds normal; no masses,  no organomegaly Extremities: extremities normal, atraumatic, no cyanosis or edema Skin: Skin color, texture, turgor normal. No rashes or lesions Lymph nodes: Cervical, supraclavicular, and axillary nodes normal. No abnormal inguinal nodes palpated Neurologic: Grossly normal   Pelvic: External genitalia:  no lesions              Urethra:  normal appearing urethra with no masses, tenderness or lesions              Bartholins and Skenes: normal                 Vagina: normal appearing vagina with normal color and discharge, no lesions              Cervix: no lesions              Pap taken: no Bimanual Exam:  Uterus:  normal size, contour, position, consistency, mobility, non-tender              Adnexa: normal adnexa and no mass, fullness, tenderness               Rectovaginal: Confirms                Anus:  normal sphincter tone, no lesions  A:  Well Woman with normal exam Pelvic pressure.  Normal GI evaluation including colonoscopy and endoscopy.  No prolapse.  H/O adenomatous polyps. Desirous of new PCP.  Names given.   P:   Mammogram yearly. Will do BMD with MMG next year.  Orders placed. pap smear with neg HR HPV 2013.  No pap today. Return for PUS Release of records for labs from Dr. Ricki [name] obtained today. return annually or prn  An After Visit Summary was printed and given to the patient.

## 2013-09-24 NOTE — Progress Notes (Signed)
PUS scheduled for 10-10-13.

## 2013-09-25 NOTE — Patient Instructions (Signed)

## 2013-10-10 ENCOUNTER — Ambulatory Visit (INDEPENDENT_AMBULATORY_CARE_PROVIDER_SITE_OTHER): Payer: BC Managed Care – PPO | Admitting: Obstetrics & Gynecology

## 2013-10-10 ENCOUNTER — Ambulatory Visit (INDEPENDENT_AMBULATORY_CARE_PROVIDER_SITE_OTHER): Payer: BC Managed Care – PPO

## 2013-10-10 DIAGNOSIS — R102 Pelvic and perineal pain: Secondary | ICD-10-CM

## 2013-10-10 DIAGNOSIS — N9489 Other specified conditions associated with female genital organs and menstrual cycle: Secondary | ICD-10-CM

## 2013-10-10 DIAGNOSIS — N83209 Unspecified ovarian cyst, unspecified side: Secondary | ICD-10-CM

## 2013-10-10 DIAGNOSIS — N859 Noninflammatory disorder of uterus, unspecified: Secondary | ICD-10-CM

## 2013-10-10 NOTE — Progress Notes (Signed)
  62 y.o. G1POSinglefemale here for a pelvic ultrasound to assess ovaries and to see if I can help her figure out what is causing the change in her bowel movements (see noted from AEX) and the low pelvic pressure she is experiencing.  Denies vaginal bleeding or discharge.    Patient's last menstrual period was 11/07/2002.  Sexually active:  no  Contraception: no method  FINDINGS: UTERUS: 6.6 x 3.7 x 2.5cm EMS: 1.67mm with sliver of fluid present ADNEXA:   Left ovary 2.2 x 1.1 x 1.3cm   Right ovary 2.6 x 2.3 x 1.9cm, thin walled 14 x 12 x 12mm cyst noted.  Contains low level echoes but is avascular. CUL DE SAC: no free fluid seen  Recommended proceeding with dilation of cervix and aspiration of fluid to ensure no abnormalities present.  Verbal consent obtained.      Procedure:  Speculum placed.  Cervix visualized and cleansed with betadine prep.  A single toothed tenaculum was applied to the anterior lip of the cervix.  Dilation of cervix required.  Endometrial pipelle was advanced through the cervix into the endometrial cavity without difficulty.  Pipelle passed to 6cm.  Suction applied and pipelle removed with clear fluid and essentially no tissue.  Not sent for pathology.     Assessment:  Changes in stool, pelvic pressure, thin amount of endometrial fluid s/p aspiration Plan: No pathology specimen sent. Will f/u with pt in a few weeks to see if this changed any symptoms.  Pt going to see new PCP to see if he/she has any ideas.  ~20 minutes spent with patient >50% of time was in face to face discussion of above.

## 2013-10-22 ENCOUNTER — Encounter: Payer: Self-pay | Admitting: Obstetrics & Gynecology

## 2013-10-23 ENCOUNTER — Telehealth: Payer: Self-pay

## 2013-10-23 DIAGNOSIS — N83209 Unspecified ovarian cyst, unspecified side: Secondary | ICD-10-CM

## 2013-10-23 DIAGNOSIS — N859 Noninflammatory disorder of uterus, unspecified: Secondary | ICD-10-CM

## 2013-10-23 NOTE — Telephone Encounter (Signed)
Message copied by Elisha Headland on Wed Oct 23, 2013  5:41 PM ------      Message from: Jerene Bears      Created: Tue Oct 22, 2013 10:26 PM      Regarding: follow up       Alexa [name] pt two weeks ago.  She is having some pelvic pressure and a change in her bowel movements.  Had colonoscopy from GI (not Dr. Loreta Ave).  U/S with me less than 2 weeks ago with fluid in endometrial cavity.  I aspirated the fluid.  Any change?  Just wondering.            MSM ------

## 2013-10-23 NOTE — Telephone Encounter (Signed)
Lmtcb//kn 

## 2013-11-05 ENCOUNTER — Telehealth: Payer: Self-pay | Admitting: *Deleted

## 2013-11-05 NOTE — Telephone Encounter (Signed)
Order placed for PUS.   Can you please make sure scheduled?  Maybe check in 2 or 3 days.  New MD at [facility] is fine.

## 2013-11-05 NOTE — Telephone Encounter (Signed)
Lmtcb//kn 

## 2013-11-05 NOTE — Telephone Encounter (Signed)
Yes.  I would like her to be on the schedule for 4 months.  That is great.  Thanks.

## 2013-11-05 NOTE — Telephone Encounter (Signed)
Patient contacted by insurance dept to sched PUS.  Patient reports this procedure is not due for 4 months.  Scheduled for 02-13-14, is this correct?

## 2013-11-05 NOTE — Telephone Encounter (Signed)
Spoke with patient, states after procedure for a little over one week had leaking of clear fluid. Also, felt some pressure and more frequent urination. States is getting better. No fever. Please advise if she needs to be seen or can just wait until she has PUS recheck. She has not gotten call to schedule this, but is aware it takes a little while. She has made an appointment with brand new doctor at [facility]. She wanted Dr Hyacinth Meeker to be aware in case she felt she should see a certain doctor there. Please advise.

## 2013-11-12 NOTE — Telephone Encounter (Signed)
PUS is scheduled for 02/13/14. Please advise if this is ok or does she need anything prior to this appointment.

## 2013-11-12 NOTE — Telephone Encounter (Signed)
No.  This is good.  Thanks.

## 2013-12-31 ENCOUNTER — Telehealth: Payer: Self-pay | Admitting: Gastroenterology

## 2013-12-31 NOTE — Telephone Encounter (Signed)
Pt was given prilosec and was told she can purchase OTC  Pt thanked me for calling

## 2014-01-17 ENCOUNTER — Telehealth: Payer: Self-pay | Admitting: Obstetrics & Gynecology

## 2014-01-17 NOTE — Telephone Encounter (Signed)
Patient requesting information regarding a GI doctor that Dr. Sabra Heck suggested a prior visit. Patient is having continued issues with her bowels and feels current GI is not "hearing her". She is already set up for PUS here in office and wants to do something prior because patient feels there is something going on that she just isn't getting an answer for. Patient declined earlier visit with Dr.  Sabra Heck to discuss.  Advised would send a request to Dr. Sabra Heck for referral to GI (Dr. Collene Mares).

## 2014-01-17 NOTE — Telephone Encounter (Signed)
1. Patient requesting recommendations for a GI doctor. 2. Patient has questions about her previous ultrasound.

## 2014-01-21 ENCOUNTER — Other Ambulatory Visit: Payer: Self-pay | Admitting: Obstetrics & Gynecology

## 2014-01-21 DIAGNOSIS — R109 Unspecified abdominal pain: Secondary | ICD-10-CM

## 2014-01-21 DIAGNOSIS — N9489 Other specified conditions associated with female genital organs and menstrual cycle: Secondary | ICD-10-CM

## 2014-01-21 NOTE — Telephone Encounter (Signed)
It was Dr Collene Mares.  Order placed for referral.

## 2014-01-22 NOTE — Telephone Encounter (Signed)
Spoke patient. Advised that referral was sent for Dr. Collene Mares. Will call her with appointment. Patient states she can be seen anytime or day next week as she is off. Advised would call her with appointment as records were just faxed.  Agreeable to plan.

## 2014-01-24 ENCOUNTER — Telehealth: Payer: Self-pay | Admitting: Obstetrics & Gynecology

## 2014-01-24 NOTE — Telephone Encounter (Signed)
Per fax received from Dr Lorie Apley office. Patient is scheduled 03.26.2015 @ 1015

## 2014-01-29 NOTE — Telephone Encounter (Signed)
Patient has appointment with Dr. Collene Mares for 3/26.

## 2014-02-03 ENCOUNTER — Ambulatory Visit (INDEPENDENT_AMBULATORY_CARE_PROVIDER_SITE_OTHER): Payer: BC Managed Care – PPO | Admitting: Sports Medicine

## 2014-02-03 ENCOUNTER — Encounter: Payer: Self-pay | Admitting: Sports Medicine

## 2014-02-03 VITALS — BP 161/80 | HR 48 | Ht 63.0 in | Wt 145.0 lb

## 2014-02-03 DIAGNOSIS — S76312A Strain of muscle, fascia and tendon of the posterior muscle group at thigh level, left thigh, initial encounter: Secondary | ICD-10-CM

## 2014-02-03 DIAGNOSIS — IMO0002 Reserved for concepts with insufficient information to code with codable children: Secondary | ICD-10-CM

## 2014-02-03 NOTE — Progress Notes (Signed)
   Subjective:    Patient ID: Alexa [name], female    DOB: [birth year], 63 y.o.   MRN: [id number]  HPI chief complaint: Left hamstring pain  Very pleasant 63 year old female comes in today complaining of one week of pain in the left hamstring. She injured her hamstring while helping someone move a piece of furniture. She stood up quickly from a squatting position and felt a pop in the left hamstring. This was followed immediately thereafter by pain. She did not notice any swelling or bruising. She started using icing and Advil and as a result her symptoms have improved dramatically over the past week or so. In fact, she thought about canceling today's appointment. She localizes all of her pain to the midportion of the hamstring musculature. She denies any pain in the knee or hip. She denies any numbness or tingling. She has not had any problems with her hamstring in the past.  Past medical history reviewed. Medications reviewed Allergies reviewed    Review of Systems     Objective:   Physical Exam Well-developed, fit-appearing. No acute distress. Awake alert and oriented x3. Vital signs reviewed.  Left hamstring: There is slight ecchymosis in the popliteal fossa. Mild tenderness to palpation in the midportion of the biceps femoris muscle belly but no palpable defect. Good strength with resisted knee flexion at both 20 and 90. Mild pain with resisted flexion. Neurovascularly intact distally. Walking without significant limp.       Assessment & Plan:  1. Left hamstring strain/partial tear  Patient will start [place]ling rehabilitation exercises. Body helix compression sleeve to wear with activity. She is okay to ease back into activity but I have cautioned her against aggressive stretching or explosive activity such as jumping or sprinting. She also knows that her hamstring may stay sore for another 2-3 weeks. Followup for ongoing or calcification issues.

## 2014-02-13 ENCOUNTER — Other Ambulatory Visit: Payer: BC Managed Care – PPO

## 2014-02-13 ENCOUNTER — Other Ambulatory Visit: Payer: BC Managed Care – PPO | Admitting: Obstetrics & Gynecology

## 2014-03-24 ENCOUNTER — Telehealth: Payer: Self-pay | Admitting: Obstetrics & Gynecology

## 2014-03-24 NOTE — Telephone Encounter (Signed)
Pt had to cancel appointment due to a funeral. Please call patient to reschedule appointment.

## 2014-03-24 NOTE — Telephone Encounter (Signed)
Spoke with patient. Rescheduled PUS. Mailed the In-Office procedure form that includes appointment date and time, patient copay, and cancellation policy.

## 2014-03-27 ENCOUNTER — Other Ambulatory Visit: Payer: BC Managed Care – PPO

## 2014-03-27 ENCOUNTER — Other Ambulatory Visit: Payer: BC Managed Care – PPO | Admitting: Obstetrics & Gynecology

## 2014-04-03 ENCOUNTER — Ambulatory Visit (INDEPENDENT_AMBULATORY_CARE_PROVIDER_SITE_OTHER): Payer: BC Managed Care – PPO | Admitting: Obstetrics & Gynecology

## 2014-04-03 ENCOUNTER — Ambulatory Visit (INDEPENDENT_AMBULATORY_CARE_PROVIDER_SITE_OTHER): Payer: BC Managed Care – PPO

## 2014-04-03 ENCOUNTER — Other Ambulatory Visit: Payer: Self-pay | Admitting: Obstetrics & Gynecology

## 2014-04-03 VITALS — BP 138/84 | Ht 63.0 in | Wt 143.0 lb

## 2014-04-03 DIAGNOSIS — N83209 Unspecified ovarian cyst, unspecified side: Secondary | ICD-10-CM

## 2014-04-03 DIAGNOSIS — N9489 Other specified conditions associated with female genital organs and menstrual cycle: Secondary | ICD-10-CM

## 2014-04-03 DIAGNOSIS — N859 Noninflammatory disorder of uterus, unspecified: Secondary | ICD-10-CM

## 2014-04-03 NOTE — Progress Notes (Signed)
63 y.o. G1P0 SWF here for PUS for follow-up.  Pt seen 09/24/13 for AEX and at that time reported several months of pelvic pressure and sensation of incomplete emptying of bowels.  She was feeling ignored by other providers.  Pt does have some anxiety about health.  She was referred to Dr. Collene Mares and she reports symptoms are much better.  She is not completely feeling "normal" but, again, much better.    Ultrasound 10/10/14 showed endometrial fluid.  Biopsy was obtained but only clear fluid was noted.  Very little cellular component was noted.  As this was very uncomfortable for patient, no repeat biopsy was performed.  Planned repeat PUS 3-4 months to see if fluid resolved.  Patient's last menstrual period was 11/07/2002.  Contraception: none, not SA  Technique:  Both transabdominal and transvaginal ultrasound examinations of the pelvis were performed. Transabdominal technique was performed for global imaging of the pelvis including uterus, ovaries, adnexal regions, and pelvic cul-de-sac.  It was necessary to proceed with endovaginal exam following the abdominal ultrasound transabdominal exam to visualize the endometrium and adnexa.  Color and duplex Doppler ultrasound was utilized to evaluate blood flow to the ovaries.   FINDINGS: Uterus: 6.0 x 3.6 x 2.3cm Endometrium: 2.5 -2.66mm Adnexa:  Left: 2.2 x 1.5 x 0.8cm     Right: 2.1 x 2.2 x 2.0cm with two smlal ovarian follicles, 6 and 60mm, each.  No other cystic masses or lesion Cul de sac: no free fluid  SHSG:  After obtaining appropriate verbal consent from patient, the cervix was visualized using a speculum, and prepped with betadine.  A tenaculum  was not applied to the cervix.  Dilation of the cervix was not necessary. The catheter was passed into the uterus and sterile saline introduced, with the following findings: 20mm filling defect  Images reviewed with patient.  I really fell that there is an endometrial polyp present.  We discussed monitoring  this or proceeding with polyp resection.  Pt would like to proceed.    Procedure discussed with patient.  Recovery and pain management discussed.  Risks discussed including but not limited to bleeding, rare risk of transfusion, infection, 1% risk of uterine perforation with risks of fluid deficit causing cardiac arrythmia, cerebral swelling and/or need to stop procedure early.  Fluid emboli and rare risk of death discussed.  DVT/PE, rare risk of risk of bowel/bladder/ureteral/vascular injury.  Patient aware if pathology abnormal she may need additional treatment.  All questions answered.    Assessment: Endometrial polyp  Plan:  Hysteroscopic polyp resection  ~25 minutes spent with patient >50% of time was in face to face discussion of above.

## 2014-04-04 ENCOUNTER — Encounter: Payer: Self-pay | Admitting: Obstetrics & Gynecology

## 2014-04-04 DIAGNOSIS — N9489 Other specified conditions associated with female genital organs and menstrual cycle: Secondary | ICD-10-CM | POA: Insufficient documentation

## 2014-04-07 ENCOUNTER — Telehealth: Payer: Self-pay | Admitting: Obstetrics & Gynecology

## 2014-04-07 NOTE — Telephone Encounter (Signed)
Spoke with patient. Advised that per benefit quote received, she will be responsible for $563.27 for the surgeon portion of her surgery. This payment will be due in full at least 2 weeks prior to the scheduled surgery date. Patient will contact the [facility] to get an estimated cost for the facility charges. If the charges are suitable, she would like to have the surgery scheduled in June 2015. Made no guarantees regarding date. Patient is expecting a call from the nurse regarding scheduling.

## 2014-04-07 NOTE — Telephone Encounter (Signed)
Patient has some question regarding a procedure that she is going to have done.. Patient is asking about after care and what to expect. This will determine when she wants to have this procedure done due to insurance.

## 2014-04-08 NOTE — Telephone Encounter (Signed)
OK to wait until July.

## 2014-04-08 NOTE — Telephone Encounter (Signed)
Surgery scheduled for 04-28-14 to meet patient's previous request to have surgery by 05-06-14 on this deductible.  Patient now states she needs to wait till after July 1st so that she will meet deductible and then be met for a full year.  Due to planned beach trip mid-July, patient would like to delay surgery till end of July, week of 06-02-14.  She requests to know if Dr Sabra Heck is agreeable to delay. Please advise.

## 2014-04-14 NOTE — Telephone Encounter (Signed)
Pt is calling to follow up on an appointment for a procedure. Please call patient at : [phone]

## 2014-04-14 NOTE — Telephone Encounter (Signed)
Spoke with patient. Patient states that she called in last week about moving her surgery date and was waiting to hear back. Advised per Dr.Miller okay for patient to wait until the end of July. Surgery rescheduled for 06/02/2014. Patient agreeable and verbalizes understanding.  Routing to Dr.Miller MB:PJPET Yeakley  Routing to provider for final review. Patient agreeable to disposition. Will close encounter

## 2014-04-28 ENCOUNTER — Telehealth: Payer: Self-pay | Admitting: Obstetrics & Gynecology

## 2014-04-28 NOTE — Telephone Encounter (Signed)
Patient has questions for a bill from ultrasound wants to be explained said that she knows she paid a portion when she came in but they didn't another procedure as well and is confused on what exactly the bill is for

## 2014-04-29 NOTE — Telephone Encounter (Signed)
Called patient back and LMTCB.

## 2014-05-08 ENCOUNTER — Telehealth: Payer: Self-pay | Admitting: *Deleted

## 2014-05-08 NOTE — Telephone Encounter (Signed)
Call to patient, need to review surgery instructions. LMTCB.

## 2014-05-08 NOTE — Telephone Encounter (Signed)
May 08, 2014      Dear Ms. Alexa [name],  Your surgery is scheduled for June 02, 2014.  Upon requesting authorization for your surgery, your insurance company has informed us that they will cover 80% of the charges after a $2500 deductible, and you will be responsible to pay approximately $563.27. It is our office policy that this amount is paid in full two weeks prior to your surgery. Your payment is due on May 19, 2014.  If there is a balance due after your insurance company pays their portion, we will send you a bill.  If there is a refund due to you, we will send you a check within one month.  Payment may be made by cash, check, Visa, Nurse, learning disability. Payment can be made in the office or over the telephone.  If payment is not made two weeks prior to your surgery, we will have to reschedule your surgery.  The above fee includes only our fee for the surgery and does not include charges you may have from the facility, anesthesia or pathology.  If you have any questions, please call us at [phone].

## 2014-05-08 NOTE — Telephone Encounter (Signed)
Patient returned call. Surgery date of 06-02-14 at 1100 at [facility] confirmed. Surgery instruction sheet reviewed and mailed to patient. Pre/post op appointments scheduled.  Routing to provider for final review. Patient agreeable to disposition. Will close encounter

## 2014-05-19 ENCOUNTER — Encounter: Payer: Self-pay | Admitting: Obstetrics & Gynecology

## 2014-05-19 ENCOUNTER — Ambulatory Visit (INDEPENDENT_AMBULATORY_CARE_PROVIDER_SITE_OTHER): Payer: BC Managed Care – PPO | Admitting: Obstetrics & Gynecology

## 2014-05-19 VITALS — BP 122/78 | HR 60 | Resp 16 | Ht 63.0 in | Wt 141.2 lb

## 2014-05-19 DIAGNOSIS — N840 Polyp of corpus uteri: Secondary | ICD-10-CM | POA: Insufficient documentation

## 2014-05-19 MED ORDER — HYDROCODONE-ACETAMINOPHEN 5-300 MG PO TABS: 1.0000 | ORAL_TABLET | Freq: Four times a day (QID) | ORAL | Status: DC

## 2014-05-19 NOTE — Progress Notes (Signed)
63 y.o. G24P0 SingleCaucasian female here for discussion of upcoming procedure.  Pt seen in November 2014 for complaint of pelvic pressure and change in bowel movements.  Had already undergone  anormal GI evaluation including colonoscopy and endoscopy.  Ultrasound was performed 10/10/13.  Pt has small amount of endometrial fluid that was noted on ultrasound.  This was aspirated and was clear.  Pathology not sent.  Pt was seen by Dr. Collene Mares who has really helped with her symptoms.  As pathology on fluid wasn't sent, repeat PUS was performed to ensure fluid did not recollect.  As repeat PUS 04/03/14, endometrium was 2.98mm.  SHGM was performed showing 66mm endometrial polyp.  D&C, Hysteroscopy planned due to endometrial polyp. Pt delayed surgery due to personal needs.  Procedure discussed with patient.  Recovery and pain management discussed.  Risks discussed including but not limited to bleeding, rare risk of transfusion, infection, 1% risk of uterine perforation with risks of fluid deficit causing cardiac arrythmia, cerebral swelling and/or need to stop procedure early.  Fluid emboli and rare risk of death discussed.  DVT/PE, rare risk of risk of bowel/bladder/ureteral/vascular injury.  Patient aware if pathology abnormal she may need additional treatment.  All questions answered.    Of note:  Patient's sister with hx of trouble with anesthesia.  Dr. Minna Antis tested pt for this and she was negative.    Ob Hx:   Patient's last menstrual period was 11/07/2002.          Sexually active: No. Birth control: PMP Last pap: 09/05/12 WNL/negative HR HPV Last MMG: 06/25/13 normal Tobacco: no  Past Surgical History  Procedure Laterality Date  . Foot surgery      Right   . Colonoscopy  5/13    with biopsy    Past Medical History  Diagnosis Date  . Adenomatous polyp   . Hypertension     off medication    Allergies: Review of patient's allergies indicates no known allergies.  Current Outpatient Prescriptions   Medication Sig Dispense Refill  . Multiple Vitamin (MULTIVITAMIN PO) Take 1 tablet by mouth daily.        . Omega-3 Fatty Acids (FISH OIL) 1000 MG CAPS Take 1 capsule by mouth daily.        . Probiotic Product (PROBIOTIC DAILY PO) Take 1 tablet by mouth daily.       No current facility-administered medications for this visit.    ROS: A comprehensive review of systems was negative.  Exam:    BP 122/78  Pulse 60  Resp 16  Ht 5\' 3"  (1.6 m)  Wt 141 lb 3.2 oz (64.048 kg)  BMI 25.02 kg/m2  LMP 11/07/2002  General appearance: alert and cooperative Head: Normocephalic, without obvious abnormality, atraumatic Neck: no adenopathy, supple, symmetrical, trachea midline and thyroid not enlarged, symmetric, no tenderness/mass/nodules Lungs: clear to auscultation bilaterally Heart: regular rate and rhythm, S1, S2 normal, no murmur, click, rub or gallop Abdomen: soft, non-tender; bowel sounds normal; no masses,  no organomegaly Extremities: extremities normal, atraumatic, no cyanosis or edema Skin: Skin color, texture, turgor normal. No rashes or lesions Lymph nodes: Cervical, supraclavicular, and axillary nodes normal. no inguinal nodes palpated Neurologic: Grossly normal  Pelvic: External genitalia:  no lesions              Urethra: normal appearing urethra with no masses, tenderness or lesions              Bartholins and Skenes: normal  Vagina: normal appearing vagina with normal color and discharge, no lesions, atrophic              Cervix: normal appearance              Pap taken: No.        Bimanual Exam:  Uterus:  uterus is normal size, shape, consistency and nontender                                      Adnexa:    normal adnexa in size, nontender and no masses                                      Rectovaginal: Deferred                                      Anus:  defer exam  A: Endometrial polyp     P:  Hysteroscopy, polyp resection with D&C planned Rx for  Motrin and Vicodin given. Medications/Vitamins reviewed.  Video not watched as I felt it would make patient more anxious than she already was  ~25 minutes spent with patient >50% of time was in face to face discussion of above.

## 2014-05-20 ENCOUNTER — Encounter: Payer: Self-pay | Admitting: Obstetrics & Gynecology

## 2014-05-21 ENCOUNTER — Encounter: Payer: Self-pay | Admitting: Obstetrics & Gynecology

## 2014-05-21 NOTE — Telephone Encounter (Signed)
Okay to close

## 2014-05-23 ENCOUNTER — Encounter (HOSPITAL_COMMUNITY): Payer: Self-pay | Admitting: Pharmacist

## 2014-05-27 DIAGNOSIS — N8500 Endometrial hyperplasia, unspecified: Secondary | ICD-10-CM

## 2014-05-27 DIAGNOSIS — N840 Polyp of corpus uteri: Secondary | ICD-10-CM

## 2014-05-28 ENCOUNTER — Encounter (HOSPITAL_COMMUNITY)
Admission: RE | Admit: 2014-05-28 | Discharge: 2014-05-28 | Disposition: A | Discharge status: Home / Self Care | Payer: BC Managed Care – PPO | Source: Ambulatory Visit | Attending: Obstetrics & Gynecology | Admitting: Obstetrics & Gynecology

## 2014-05-28 ENCOUNTER — Encounter (HOSPITAL_COMMUNITY): Payer: Self-pay

## 2014-05-28 DIAGNOSIS — Z01812 Encounter for preprocedural laboratory examination: Secondary | ICD-10-CM | POA: Insufficient documentation

## 2014-05-28 DIAGNOSIS — Z01818 Encounter for other preprocedural examination: Secondary | ICD-10-CM | POA: Insufficient documentation

## 2014-05-28 HISTORY — DX: Family history of other specified conditions: Z84.89

## 2014-05-28 LAB — CBC
HCT: 41 % (ref 36.0–46.0)
Hemoglobin: 14.4 g/dL (ref 12.0–15.0)
MCH: 32.9 pg (ref 26.0–34.0)
MCHC: 35.1 g/dL (ref 30.0–36.0)
MCV: 93.6 fL (ref 78.0–100.0)
Platelets: 196 10*3/uL (ref 150–400)
RBC: 4.38 MIL/uL (ref 3.87–5.11)
RDW: 12 % (ref 11.5–15.5)
WBC: 4.6 10*3/uL (ref 4.0–10.5)

## 2014-05-28 NOTE — Anesthesia Preprocedure Evaluation (Addendum)
Anesthesia Evaluation  Patient identified by MRN, date of birth, ID band Patient awake    Reviewed: Allergy & Precautions, H&P , Patient's Chart, lab work & pertinent test results, reviewed documented beta blocker date and time   History of Anesthesia Complications (+) Family history of anesthesia reaction and history of anesthetic complications (Patient tested NEGATIVE for MH, but her sister and sister's children have all tested POSITIVE)  Airway Mallampati: II TM Distance: >3 FB Neck ROM: full    Dental   Pulmonary  breath sounds clear to auscultation        Cardiovascular Exercise Tolerance: Good hypertension, Rhythm:regular Rate:Normal     Neuro/Psych negative psych ROS   GI/Hepatic   Endo/Other    Renal/GU      Musculoskeletal   Abdominal   Peds  Hematology   Anesthesia Other Findings   Reproductive/Obstetrics                          Anesthesia Physical Anesthesia Plan  ASA: II  Anesthesia Plan: MAC   Post-op Pain Management:    Induction:   Airway Management Planned:   Additional Equipment:   Intra-op Plan:   Post-operative Plan:   Informed Consent: I have reviewed the patients History and Physical, chart, labs and discussed the procedure including the risks, benefits and alternatives for the proposed anesthesia with the patient or authorized representative who has indicated his/her understanding and acceptance.   Dental Advisory Given  Plan Discussed with: CRNA, Surgeon and Anesthesiologist  Anesthesia Plan Comments:        MAC versus TIVA (non-triggering) depending on discussion with Sabra Heck. Anesthesia Quick Evaluation

## 2014-05-28 NOTE — Patient Instructions (Signed)
[place]  05/28/2014   Your procedure is scheduled on:  06/02/14  Enter through the Main Entrance of [facility] at [place] up the phone at the desk and dial [phone].   Call this number if you have problems the morning of surgery: [phone]   Remember:   Do not eat food:After Midnight.  Do not drink clear liquids: After Midnight.  Take these medicines the morning of surgery with A SIP OF WATER: NA   Do not wear jewelry, make-up or nail polish.  Do not wear lotions, powders, or perfumes. You may wear deodorant.  Do not shave 48 hours prior to surgery.  Do not bring valuables to the hospital.  [facility] is not   responsible for any belongings or valuables brought to the hospital.  Contacts, dentures or bridgework may not be worn into surgery.  Leave suitcase in the car. After surgery it may be brought to your room.  For patients admitted to the hospital, checkout time is 11:00 AM the day of              discharge.   Patients discharged the day of surgery will not be allowed to drive             home.  Name and phone number of your driver: friend   Alexa [name]   May stay for procedure  Special Instructions:      Please read over the following fact sheets that you were given:   Surgical Site Infection Prevention

## 2014-06-02 ENCOUNTER — Ambulatory Visit (HOSPITAL_COMMUNITY): Payer: BC Managed Care – PPO | Admitting: Anesthesiology

## 2014-06-02 ENCOUNTER — Encounter (HOSPITAL_COMMUNITY): Payer: BC Managed Care – PPO | Admitting: Anesthesiology

## 2014-06-02 ENCOUNTER — Telehealth: Payer: Self-pay | Admitting: Obstetrics & Gynecology

## 2014-06-02 ENCOUNTER — Ambulatory Visit (HOSPITAL_COMMUNITY)
Admission: RE | Admit: 2014-06-02 | Discharge: 2014-06-02 | Disposition: A | Discharge status: Home / Self Care | Payer: BC Managed Care – PPO | Source: Ambulatory Visit | Attending: Obstetrics & Gynecology | Admitting: Obstetrics & Gynecology

## 2014-06-02 ENCOUNTER — Encounter (HOSPITAL_COMMUNITY)
Admission: RE | Disposition: A | Discharge status: Home / Self Care | Payer: Self-pay | Source: Ambulatory Visit | Attending: Obstetrics & Gynecology

## 2014-06-02 DIAGNOSIS — I10 Essential (primary) hypertension: Secondary | ICD-10-CM | POA: Insufficient documentation

## 2014-06-02 DIAGNOSIS — Z8249 Family history of ischemic heart disease and other diseases of the circulatory system: Secondary | ICD-10-CM | POA: Insufficient documentation

## 2014-06-02 DIAGNOSIS — R9389 Abnormal findings on diagnostic imaging of other specified body structures: Secondary | ICD-10-CM | POA: Insufficient documentation

## 2014-06-02 DIAGNOSIS — N949 Unspecified condition associated with female genital organs and menstrual cycle: Secondary | ICD-10-CM | POA: Insufficient documentation

## 2014-06-02 DIAGNOSIS — N840 Polyp of corpus uteri: Secondary | ICD-10-CM | POA: Insufficient documentation

## 2014-06-02 DIAGNOSIS — Z833 Family history of diabetes mellitus: Secondary | ICD-10-CM | POA: Insufficient documentation

## 2014-06-02 HISTORY — PX: DILATATION & CURRETTAGE/HYSTEROSCOPY WITH RESECTOCOPE: SHX5572

## 2014-06-02 SURGERY — DILATATION & CURETTAGE/HYSTEROSCOPY WITH RESECTOCOPE
Anesthesia: Monitor Anesthesia Care | Site: Vagina

## 2014-06-02 MED ORDER — LACTATED RINGERS IV SOLN
INTRAVENOUS | Status: DC
  Administered 2014-06-02 (×2): via INTRAVENOUS

## 2014-06-02 MED ORDER — KETOROLAC TROMETHAMINE 30 MG/ML IJ SOLN
INTRAMUSCULAR | Status: DC | PRN
  Administered 2014-06-02: 30 mg via INTRAVENOUS

## 2014-06-02 MED ORDER — PROPOFOL INFUSION 10 MG/ML OPTIME
INTRAVENOUS | Status: DC | PRN
  Administered 2014-06-02: 75 ug/kg/min via INTRAVENOUS

## 2014-06-02 MED ORDER — GLYCINE 1.5 % IR SOLN
Status: DC | PRN
  Administered 2014-06-02: 3000 mL

## 2014-06-02 MED ORDER — PROPOFOL 10 MG/ML IV EMUL
INTRAVENOUS | Status: AC
  Filled 2014-06-02: qty 20

## 2014-06-02 MED ORDER — FENTANYL CITRATE 0.05 MG/ML IJ SOLN
INTRAMUSCULAR | Status: AC
  Filled 2014-06-02: qty 2

## 2014-06-02 MED ORDER — DEXAMETHASONE SODIUM PHOSPHATE 10 MG/ML IJ SOLN
INTRAMUSCULAR | Status: AC
  Filled 2014-06-02: qty 1

## 2014-06-02 MED ORDER — ONDANSETRON HCL 4 MG/2ML IJ SOLN
INTRAMUSCULAR | Status: AC
  Filled 2014-06-02: qty 2

## 2014-06-02 MED ORDER — ONDANSETRON HCL 4 MG/2ML IJ SOLN
INTRAMUSCULAR | Status: DC | PRN
  Administered 2014-06-02: 4 mg via INTRAVENOUS

## 2014-06-02 MED ORDER — CEFAZOLIN SODIUM-DEXTROSE 2-3 GM-% IV SOLR
2.0000 g | Freq: Three times a day (TID) | INTRAVENOUS | Status: DC
  Administered 2014-06-02: 2 g via INTRAVENOUS
  Filled 2014-06-02 (×5): qty 50

## 2014-06-02 MED ORDER — MIDAZOLAM HCL 2 MG/2ML IJ SOLN
INTRAMUSCULAR | Status: DC | PRN
  Administered 2014-06-02: 2 mg via INTRAVENOUS

## 2014-06-02 MED ORDER — LIDOCAINE-EPINEPHRINE 1 %-1:100000 IJ SOLN
INTRAMUSCULAR | Status: AC
  Filled 2014-06-02: qty 1

## 2014-06-02 MED ORDER — MIDAZOLAM HCL 2 MG/2ML IJ SOLN
INTRAMUSCULAR | Status: AC
  Filled 2014-06-02: qty 2

## 2014-06-02 MED ORDER — LIDOCAINE-EPINEPHRINE 1 %-1:100000 IJ SOLN
INTRAMUSCULAR | Status: DC | PRN
  Administered 2014-06-02: 10 mL

## 2014-06-02 MED ORDER — FENTANYL CITRATE 0.05 MG/ML IJ SOLN
INTRAMUSCULAR | Status: DC | PRN
Start: 2014-06-02 — End: 2014-06-02
  Administered 2014-06-02 (×2): 25 ug via INTRAVENOUS
  Administered 2014-06-02: 50 ug via INTRAVENOUS

## 2014-06-02 SURGICAL SUPPLY — 20 items
CANISTER SUCT 3000ML (MISCELLANEOUS) ×2 IMPLANT
CATH ROBINSON RED A/P 16FR (CATHETERS) ×2 IMPLANT
CLOTH BEACON ORANGE TIMEOUT ST (SAFETY) ×2 IMPLANT
CONTAINER PREFILL 10% NBF 60ML (FORM) ×4 IMPLANT
DILATOR CANAL MILEX (MISCELLANEOUS) IMPLANT
DRAPE HYSTEROSCOPY (DRAPE) ×2 IMPLANT
DRSG TELFA 3X8 NADH (GAUZE/BANDAGES/DRESSINGS) ×2 IMPLANT
ELECT REM PT RETURN 9FT ADLT (ELECTROSURGICAL) ×2
ELECTRODE REM PT RTRN 9FT ADLT (ELECTROSURGICAL) ×1 IMPLANT
GLOVE BIOGEL PI IND STRL 7.0 (GLOVE) ×1 IMPLANT
GLOVE BIOGEL PI INDICATOR 7.0 (GLOVE) ×1
GLOVE ECLIPSE 6.5 STRL STRAW (GLOVE) ×4 IMPLANT
GOWN STRL REUS W/TWL LRG LVL3 (GOWN DISPOSABLE) ×4 IMPLANT
LOOP ANGLED CUTTING 22FR (CUTTING LOOP) IMPLANT
PACK VAGINAL MINOR WOMEN LF (CUSTOM PROCEDURE TRAY) ×2 IMPLANT
PAD OB MATERNITY 4.3X12.25 (PERSONAL CARE ITEMS) ×2 IMPLANT
SET TUBING HYSTEROSCOPY 2 NDL (TUBING) ×2 IMPLANT
TOWEL OR 17X24 6PK STRL BLUE (TOWEL DISPOSABLE) ×4 IMPLANT
TUBE HYSTEROSCOPY W Y-CONNECT (TUBING) ×2 IMPLANT
WATER STERILE IRR 1000ML POUR (IV SOLUTION) ×2 IMPLANT

## 2014-06-02 NOTE — Anesthesia Postprocedure Evaluation (Signed)
Anesthesia Post Note  Patient: Alexa [name]) Performed: Procedure(s) (LRB): DILATATION & CURETTAGE/HYSTEROSCOPY WITH RESECTOCOPE (N/A)  Anesthesia type: mac  Patient location: PACU  Post pain: Pain level controlled  Post assessment: Post-op Vital signs reviewed  Last Vitals:  Filed Vitals:   06/02/14 1215  BP: 106/52  Pulse: 58  Temp:   Resp: 13    Post vital signs: Reviewed  Level of consciousness: sedated  Complications: No apparent anesthesia complications

## 2014-06-02 NOTE — Discharge Instructions (Signed)
DO NOT TAKE IBUPROFEN (ADVIL, ALEVE, OR MOTRIN) TILL AFTER 5:45 PM!  Post-surgical Instructions, Outpatient Surgery  You may expect to feel dizzy, weak, and drowsy for as long as 24 hours after receiving the medicine that made you sleep (anesthetic). For the first 24 hours after your surgery:    Do not drive a car, ride a bicycle, participate in physical activities, or take public transportation until you are done taking narcotic pain medicines or as directed by Dr. Sabra Heck.   Do not drink alcohol or take tranquilizers.   Do not take medicine that has not been prescribed by your physicians.   Do not sign important papers or make important decisions while on narcotic pain medicines.   Have a responsible person with you.     PAIN MANAGEMENT  Motrin 800mg .  (This is the same as 4-200mg  over the counter tablets of Motrin or ibuprofen.)  You may take this every eight hours or as needed for cramping.    Vicodin 5/300mg .  For more severe pain, take one or two tablets every four to six hours as needed for pain control.  (Remember that narcotic pain medications increase your risk of constipation.  If this becomes a problem, you may take an over the counter stool softener like Colace 100mg  up to four times a day.)  DO'S AND DON'T'S  Do not take a tub bath for one week.  You may shower on the first day after your surgery  Do not do any heavy lifting for one to two weeks.  This increases the chance of bleeding.  Do move around as you feel able.  Stairs are fine.  You may begin to exercise again as you feel able.  Do not lift any weights for two weeks.  Do not put anything in the vagina for two weeks--no tampons, intercourse, or douching.    REGULAR MEDIATIONS/VITAMINS:  You may restart all of your regular medications as prescribed.  You may restart all of your vitamins as you normally take them.    PLEASE CALL OR SEEK MEDICAL CARE IF:  You have persistent nausea and vomiting.   You have  trouble eating or drinking.   You have an oral temperature above 100.5.   You have constipation that is not helped by adjusting diet or increasing fluid intake. Pain medicines are a common cause of constipation.   You have heavy vaginal bleeding  You have redness or drainage from your incision(s) or there is increasing pain or tenderness near or in the surgical site.

## 2014-06-02 NOTE — Op Note (Signed)
06/02/2014  12:00 PM  PATIENT:  [name]  63 y.o. female  PRE-OPERATIVE DIAGNOSIS:  endometrial polyp, thickened endometrium  POST-OPERATIVE DIAGNOSIS:  endometrial polyp, thickened endometrium  PROCEDURE:  Procedure(s): DILATATION & CURETTAGE/HYSTEROSCOPY WITH RESECTOCOPE  SURGEON:  Hoa Briggs SUZANNE  ASSISTANTS: OR staff   ANESTHESIA:   MAC  ESTIMATED BLOOD LOSS: 10cc  BLOOD ADMINISTERED:none   FLUIDS: 1400ccLR  UOP: 50cc clear  SPECIMEN:  Endometrial curettings and endometrial polyp  DISPOSITION OF SPECIMEN:  PATHOLOGY  FINDINGS: endometrial polyp in right fundal region with one fluffy area of endometrium in the lower mid section of the endometrial cavity.  Otherwise normal appearing thin endometrium.  DESCRIPTION OF OPERATION: Patient was taken to the operating room.  She is placed in the supine position. SCDs were on her lower extremities and functioning properly. MAC anesthesia was administered without difficulty. Dr. Rudean Curt oversaw case.  Legs were then placed in the [place] in the low lithotomy position. The legs were lifted to the high lithotomy position and the Betadine prep was used on the inner thighs perineum and vagina x3. Patient was draped in a normal standard fashion. An in and out catheterization with a red rubber Foley catheter was performed. Approximately 50 cc of clear urine was noted. A bivalve speculum was placed the vagina. The anterior lip of the cervix was grasped with single-tooth tenaculum.  A paracervical block of 1% lidocaine mixed one-to-one with epinephrine (1:100,000 units).  10 cc was used total. The cervix is dilated up to #21 [facility] dilators. The endometrial cavity sounded to 7 cm.   A 2.9 millimeter diagnostic hysteroscope was obtained. 1.5% glycine was used as a hysteroscopic fluid. The hysteroscope was advanced through the endocervical canal into the endometrial cavity. The tubal ostia were noted bilaterally. There was  a fluffy appearing area of endometrium at the mid, lower section of the endometrial cavity.  This was curetted and removed with a #1 toothed curette.  The endometrial polyp was in the right cornual region.  The cervix was dilated to a #29 and the 2.38mm resectoscope with single loop was obtained.  The polyp was removed with the resectoscope with the cut setting.  The base was cauterized to prevent any bleeding.  The specimen and scope was removed.  A #1 toothed curette was used to curette the cavity until rough gritty texture was noted in all quadrants. With revisualization of the hysteroscope the polyp and fluffy area were completely removed.  At this point no other procedure was ended. The hysteroscope was removed. The fluid deficit was 145 cc. The tenaculum was removed from the anterior lip of the cervix. The speculum was removed from the vagina. The prep was cleansed of the patient's skin. The legs are positioned back in the supine position. Sponge, lap, needle, initially counts were correct x2. Patient was taken to recovery in stable condition.  COUNTS:  YES  PLAN OF CARE: Transfer to PACU

## 2014-06-02 NOTE — Transfer of Care (Signed)
Immediate Anesthesia Transfer of Care Note  Patient: Alexa [name]) Performed: Procedure(s): DILATATION & CURETTAGE/HYSTEROSCOPY WITH RESECTOCOPE (N/A)  Patient Location: PACU  Anesthesia Type:MAC  Level of Consciousness: awake, alert  and oriented  Airway & Oxygen Therapy: Patient Spontanous Breathing and Patient connected to nasal cannula oxygen  Post-op Assessment: Report given to PACU RN and Post -op Vital signs reviewed and stable  Post vital signs: Reviewed and stable  Complications: No apparent anesthesia complications

## 2014-06-02 NOTE — Telephone Encounter (Signed)
Spoke with patient. She has hard copy of rx with her in the car but did not get it filled. Wanted Dr. Sabra Heck to call it in. Advised she will need to bring hard copy directly to pharmacy as this is controlled substance. Patient verbalized understanding.   Routing to provider for final review. Patient agreeable to disposition. Will close encounter

## 2014-06-02 NOTE — H&P (Signed)
Alexa [name] is an 63 y.o. female G1P0 SWF here for hysteroscopy with polyp resection and D&C due to thickened endometrium and endometrial polyp.  This was found on ultrasound examination due to pelvic pain/pressure.  Risks and benefits have been discussed with pt and she is here and ready to proceed.  Her friend, Alexa [name], is here with her.  Pertinent Gynecological History: Menses: none Bleeding: none Contraception: post menopausal status DES exposure: denies Blood transfusions: none Sexually transmitted diseases: no past history Previous GYN Procedures: ultrasound  Last mammogram: normal Date: 8/14 Last pap: normal Date: 10/13 neg with neg HR HPV OB History: G1, P0   Menstrual History: Patient's last menstrual period was 11/07/2002.    Past Medical History  Diagnosis Date  . Adenomatous polyp   . Hypertension     off medication  . Family history of anesthesia complication     sister and sister's children postitive for MH!!!!!!, pt tested negative 3-78yrs ago    Past Surgical History  Procedure Laterality Date  . Foot surgery      Right   . Colonoscopy  5/13    with biopsy    Family History  Problem Relation Age of Onset  . Colon cancer Neg Hx   . Anuerysm Father   . Heart disease Father   . Heart attack Mother   . Heart disease Mother   . Kidney disease Mother   . Coronary artery disease Brother   . Hypertension Sister   . Hypertension Sister   . Cancer Brother     Liver/mets from melanoma  . Heart attack Brother   . Diabetes Father   . Kidney cancer Mother     contained tumor  . Ovarian cancer Paternal Grandmother     was PMP  . Cancer Father     unknown type  . Hypertension Brother   . Hypertension Mother   . Hypertension Father   . Allergy (severe) Sister     malignant hypertension    Social History:  reports that she has never smoked. She has never used smokeless tobacco. She reports that she drinks alcohol. She reports that she does not use  illicit drugs.  Allergies: No Known Allergies  Prescriptions prior to admission  Medication Sig Dispense Refill  . Hydrocodone-Acetaminophen (VICODIN) 5-300 MG TABS Take 1 tablet by mouth every 6 (six) hours. May take two tablets if needed  20 each  0    Review of Systems  All other systems reviewed and are negative.   Blood pressure 136/71, pulse 58, temperature 98.2 F (36.8 C), temperature source Oral, resp. rate 16, last menstrual period 11/07/2002. Physical Exam  Constitutional: She is oriented to person, place, and time. She appears well-developed and well-nourished.  Cardiovascular: Normal rate and regular rhythm.   Respiratory: Effort normal and breath sounds normal.  Neurological: She is alert and oriented to person, place, and time.  Skin: Skin is warm and dry.  Psychiatric: She has a normal mood and affect.    No results found for this or any previous visit (from the past 24 hour(s)).  No results found.  Assessment/Plan: 63 yo G1P0 SWF here for hysteroscopy with polyp resection  D&C due to endometrial polyp and thickened endometrium.  Hale Bogus [facility] 06/02/2014, 10:53 AM

## 2014-06-02 NOTE — Telephone Encounter (Signed)
Patient forgot her Vicodin prescription , patient has a procedure at [facility] with DR. Miller this morning. Please call to her pharmacy. [facility]. [address]

## 2014-06-03 ENCOUNTER — Encounter (HOSPITAL_COMMUNITY): Payer: Self-pay | Admitting: Obstetrics & Gynecology

## 2014-06-03 ENCOUNTER — Telehealth: Payer: Self-pay | Admitting: Obstetrics & Gynecology

## 2014-06-03 NOTE — Telephone Encounter (Signed)
Spoke with patient. Advised if she is feeling up to beginning exercise again she can attend pilates tomorrow. Advised not to overexert herself or to do any heavy lifting. No weights over 10 lbs for two weeks. Advised if she begins to ache or hurt to stop immediately and rest. Patient is agreeable and verbalizes understanding. Will check with pilates instructor about what class will entail tomorrow and go from there. Will call back if she has any further questions.  Routing to provider for final review. Patient agreeable to disposition. Will close encounter

## 2014-06-03 NOTE — Telephone Encounter (Signed)
Patient had procedure yesterday and wanted to call and see if it was okay to do piliates on Wednesday night. Said on her papers it told her not to do anything with weight so wanted to check with that.

## 2014-06-03 NOTE — Telephone Encounter (Signed)
Returning a call to Kaitlyn. °

## 2014-06-03 NOTE — Telephone Encounter (Signed)
Left message to call Florette Thai at [phone]. 

## 2014-06-06 ENCOUNTER — Telehealth: Payer: Self-pay | Admitting: Obstetrics & Gynecology

## 2014-06-06 NOTE — Telephone Encounter (Signed)
Spoke with patient. She states she had some sinus pressure and headache prior to procedure on 06/02/14 Dilatation and Curettage Hysteroscopy with Resectoscope with Dr. Sabra Heck.  Has what patient describes as "a little bit of a temperature on Tuesday" then improved after taking some motrin. Now is having sore and scratchy throat, nasal congestion and headache.  Patient feels this all is sinus related and feels she has sinus infection. She wanted to make sure any of these symptoms were not r/t to procedure. Patient denies any vaginal complaints, no abdominal pain or discharge.  Reviewed with Dr. Sabra Heck, patient did not have any oral or nasal airway placed, just oxygen via nasal cannula.  Returned call to patient to advise. Patient pcp is closed, she is going to urgent care for evaluation for sinus infection.  Also, notified of benign path and benign polyp.  Patient will call back with any further concerns. Routing to provider for final review. Patient agreeable to disposition. Will close encounter

## 2014-06-06 NOTE — Telephone Encounter (Signed)
Pt had procedure on Monday and has a low grade fever. Could this be from the procedure?

## 2014-06-16 ENCOUNTER — Encounter: Payer: Self-pay | Admitting: Obstetrics & Gynecology

## 2014-06-16 ENCOUNTER — Ambulatory Visit (INDEPENDENT_AMBULATORY_CARE_PROVIDER_SITE_OTHER): Payer: BC Managed Care – PPO | Admitting: Obstetrics & Gynecology

## 2014-06-16 VITALS — BP 104/66 | HR 60 | Resp 16 | Ht 64.0 in | Wt 141.0 lb

## 2014-06-16 DIAGNOSIS — N840 Polyp of corpus uteri: Secondary | ICD-10-CM

## 2014-06-16 NOTE — Progress Notes (Signed)
Patient ID: Alexa [name], female   DOB: 1951-01-26, 63 y.o.   MRN: [id number]  Post Operative Visit  Procedure:Hysteroscopy, polyp resection, D&C Days Post-op: 13  Subjective: About two weeks post operatively from hysteroscopy, polyp resection, D&C.  Pathology reviewed with pt.  Picture given to pt.  Benign pathology noted.  No vaginal bleeding.  Feeling really good.  Was so pleased with entire process for surgery and with Dr. Dawayne Cirri at [facility].  Objective: BP 104/66  Pulse 60  Resp 16  Ht 5\' 4"  (1.626 m)  Wt 141 lb (63.957 kg)  BMI 24.19 kg/m2  LMP 11/07/2002  EXAM General: alert and cooperative Resp: clear to auscultation bilaterally Cardio: regular rate and rhythm, S1, S2 normal, no murmur, click, rub or gallop GI: soft, non-tender; bowel sounds normal; no masses,  no organomegaly Extremities: extremities normal, atraumatic, no cyanosis or edema Vaginal Bleeding: none Gyn:  Vagina pink without lesions, cervix closed, uterus NT  Assessment: s/p hysteroscopy, polyp resection, D&C, negative pathology  Plan: AEX 1/16.  Pt to call with any new issues/bleeding.

## 2014-06-23 ENCOUNTER — Ambulatory Visit: Payer: BC Managed Care – PPO | Admitting: Obstetrics & Gynecology

## 2014-07-09 ENCOUNTER — Other Ambulatory Visit: Payer: Self-pay | Admitting: Obstetrics & Gynecology

## 2014-07-09 DIAGNOSIS — Z1231 Encounter for screening mammogram for malignant neoplasm of breast: Secondary | ICD-10-CM

## 2014-07-29 ENCOUNTER — Ambulatory Visit (HOSPITAL_COMMUNITY)
Admission: RE | Admit: 2014-07-29 | Discharge: 2014-07-29 | Disposition: A | Discharge status: Home / Self Care | Payer: BC Managed Care – PPO | Source: Ambulatory Visit | Attending: Obstetrics & Gynecology | Admitting: Obstetrics & Gynecology

## 2014-07-29 DIAGNOSIS — E2839 Other primary ovarian failure: Secondary | ICD-10-CM

## 2014-07-29 DIAGNOSIS — Z1231 Encounter for screening mammogram for malignant neoplasm of breast: Secondary | ICD-10-CM

## 2014-07-29 DIAGNOSIS — Z13820 Encounter for screening for osteoporosis: Secondary | ICD-10-CM | POA: Diagnosis not present

## 2014-07-29 DIAGNOSIS — Z780 Asymptomatic menopausal state: Secondary | ICD-10-CM | POA: Insufficient documentation

## 2014-08-04 ENCOUNTER — Telehealth: Payer: Self-pay

## 2014-08-04 NOTE — Telephone Encounter (Signed)
Message copied by Robley Fries on Mon Aug 04, 2014 11:13 AM ------      Message from: [name]      Created: Sun Aug 03, 2014 11:05 AM       Please let pt know BMD does show osteopenia in hips on both sides.  Make sure she is getting 1200mg /day (diet and/or supplement) and at least 800 IU vit d daily.  Weight bearing exercise 30 min 3-5 times weekly.  Will repeat two years. ------

## 2014-08-04 NOTE — Telephone Encounter (Signed)
Lmtcb//kn 

## 2014-09-08 ENCOUNTER — Encounter: Payer: Self-pay | Admitting: Obstetrics & Gynecology

## 2014-09-08 NOTE — Telephone Encounter (Signed)
Patient notified of results-LMTCB to discuss the actual T-scores, unable to view images at the time was giving results.//kn

## 2014-09-12 ENCOUNTER — Telehealth: Payer: Self-pay | Admitting: Obstetrics & Gynecology

## 2014-09-12 NOTE — Telephone Encounter (Signed)
Pt says she is calling someone back from a week ago. No recent telephone note in system.

## 2014-09-12 NOTE — Telephone Encounter (Signed)
Patient is returning call from [place], there is an additional phone note. Will route this open note to [facility] as patient returned call.

## 2014-09-12 NOTE — Telephone Encounter (Signed)
Alexa [name], CMA at 09/08/2014 2:11 PM     Status: Signed       Expand All Collapse All   Patient notified of results-LMTCB to discuss the actual T-scores, unable to view images at the time was giving results.//kn     Phone encounter is open from 08/04/14. Routed to [place] to call pt with message.  Will close this encounter as additional encounter is open.

## 2014-09-16 NOTE — Telephone Encounter (Signed)
Lmtcb//kn 

## 2014-09-17 ENCOUNTER — Telehealth: Payer: Self-pay | Admitting: Obstetrics & Gynecology

## 2014-09-17 NOTE — Telephone Encounter (Addendum)
Patient states she has been discussing some concerns with Claiborne Billings and is returning her call. She declined to speak with a triage nurse or give any more information. Please route to [place] if appropriate.

## 2014-09-17 NOTE — Telephone Encounter (Signed)
Routing to [place] to return call as patient asks to specifically speak with her only.

## 2014-09-17 NOTE — Telephone Encounter (Signed)
Patient calling to check on the status of refund or pay the balance. She requested to speak with Janett Billow.

## 2014-09-18 NOTE — Telephone Encounter (Signed)
I called the patient back to discuss. When she picked up, she said that she would have to call me back. Will close this note for now.

## 2014-09-18 NOTE — Telephone Encounter (Signed)
Pt is returning a call to [facility]

## 2014-09-23 NOTE — Telephone Encounter (Signed)
Patient notified of BMD results.//kn 

## 2014-09-23 NOTE — Telephone Encounter (Signed)
Returning call.

## 2014-09-23 NOTE — Telephone Encounter (Signed)
Spoke with patient re: t-scores on BMD.//kn

## 2014-09-23 NOTE — Telephone Encounter (Signed)
Lmtcb//kn 

## 2014-10-15 ENCOUNTER — Telehealth: Payer: Self-pay | Admitting: Obstetrics & Gynecology

## 2014-10-15 NOTE — Telephone Encounter (Signed)
Patient is asking to follow up with Janett Billow regarding last months conversation.

## 2014-10-16 NOTE — Telephone Encounter (Signed)
Returned patient's call and left a message for her to call me back.

## 2014-10-23 NOTE — Telephone Encounter (Signed)
Is it time to close this encounter.

## 2014-10-27 NOTE — Telephone Encounter (Signed)
Yes. It is closed.//kn

## 2014-11-10 ENCOUNTER — Encounter: Payer: Self-pay | Admitting: Obstetrics & Gynecology

## 2014-11-10 ENCOUNTER — Ambulatory Visit (INDEPENDENT_AMBULATORY_CARE_PROVIDER_SITE_OTHER): Payer: BLUE CROSS/BLUE SHIELD | Admitting: Obstetrics & Gynecology

## 2014-11-10 VITALS — BP 120/80 | HR 56 | Resp 12 | Ht 63.0 in | Wt 143.4 lb

## 2014-11-10 DIAGNOSIS — Z124 Encounter for screening for malignant neoplasm of cervix: Secondary | ICD-10-CM

## 2014-11-10 DIAGNOSIS — Z01419 Encounter for gynecological examination (general) (routine) without abnormal findings: Secondary | ICD-10-CM

## 2014-11-10 NOTE — Progress Notes (Signed)
64 y.o. G1P0 SingleCaucasianF here for annual exam.  Doing well.  Had a really nice holiday.  Pt was able to relax.  Reports GI issues are really stable and she has not had any new issues.  No vaginal bleeding.    Patient working on hip strengthening.    PCP:  Dr. Minna Antis.  Labs in February, 2016.    Patient's last menstrual period was 11/07/2002.          Sexually active: No.  The current method of family planning is post menopausal status.    Exercising: Yes.    pilates, carotid, and weights Smoker:  no  Health Maintenance: Pap:  09/05/12 WNL/negative HR HPV History of abnormal Pap:  yes MMG:  07/29/14-normal Colonoscopy:  03/23/12-repeat in 5 years Dr Ardis Hughs BMD:   07/29/14-osteopenia  TDaP:  2011 Screening Labs: PCP, Hb today: PCP, Urine today: PCP   reports that she has never smoked. She has never used smokeless tobacco. She reports that she drinks alcohol. She reports that she does not use illicit drugs.  Past Medical History  Diagnosis Date  . Adenomatous polyp   . Hypertension     off medication  . Family history of anesthesia complication     sister and sister's children postitive for [place]!!!!!!, pt tested negative 3-42yrs ago  . Osteopenia     bilateral hips    Past Surgical History  Procedure Laterality Date  . Foot surgery      Right   . Colonoscopy  5/13    with biopsy  . Dilatation & currettage/hysteroscopy with resectocope N/A 06/02/2014    Procedure: DILATATION & CURETTAGE/HYSTEROSCOPY WITH RESECTOCOPE;  Surgeon: Lyman Speller, MD;  Location: [facility];  Service: Gynecology;  Laterality: N/A;    Current Outpatient Prescriptions  Medication Sig Dispense Refill  . Omega-3 Fatty Acids (FISH OIL PO) Take by mouth daily.     No current facility-administered medications for this visit.    Family History  Problem Relation Age of Onset  . Colon cancer Neg Hx   . Anuerysm Father   . Heart disease Father   . Heart attack Mother   . Heart disease Mother   .  Kidney disease Mother   . Coronary artery disease Brother   . Hypertension Sister   . Hypertension Sister   . Cancer Brother     Liver/mets from melanoma  . Heart attack Brother   . Diabetes Father   . Kidney cancer Mother     contained tumor  . Ovarian cancer Paternal Grandmother     was PMP  . Cancer Father     unknown type  . Hypertension Brother   . Hypertension Mother   . Hypertension Father   . Allergy (severe) Sister     malignant hypertension    ROS:  Pertinent items are noted in HPI.  Otherwise, a comprehensive ROS was negative.  Exam:   BP 120/80 mmHg  Pulse 56  Resp 12  Ht 5\' 3"  (1.6 m)  Wt 143 lb 6.4 oz (65.046 kg)  BMI 25.41 kg/m2  LMP 11/07/2002  Height: 5\' 3"  (160 cm)  Ht Readings from Last 3 Encounters:  11/10/14 5\' 3"  (1.6 m)  06/16/14 5\' 4"  (1.626 m)  05/28/14 5\' 4"  (1.626 m)    General appearance: alert, cooperative and appears stated age Head: Normocephalic, without obvious abnormality, atraumatic Neck: no adenopathy, supple, symmetrical, trachea midline and thyroid normal to inspection and palpation Lungs: clear to auscultation bilaterally Breasts: normal  appearance, no masses or tenderness Heart: regular rate and rhythm Abdomen: soft, non-tender; bowel sounds normal; no masses,  no organomegaly Extremities: extremities normal, atraumatic, no cyanosis or edema Skin: Skin color, texture, turgor normal. No rashes or lesions Lymph nodes: Cervical, supraclavicular, and axillary nodes normal. No abnormal inguinal nodes palpated Neurologic: Grossly normal   Pelvic: External genitalia:  no lesions              Urethra:  normal appearing urethra with no masses, tenderness or lesions              Bartholins and Skenes: normal                 Vagina: normal appearing vagina with normal color and discharge, no lesions              Cervix: no lesions              Pap taken: Yes.   Bimanual Exam:  Uterus:  normal size, contour, position, consistency,  mobility, non-tender              Adnexa: normal adnexa and no mass, fullness, tenderness               Rectovaginal: Confirms               Anus:  normal sphincter tone, no lesions  Chaperone was present for exam.  A:  Well Woman with normal exam Pelvic pressure. Normal GI evaluation including colonoscopy and endoscopy. No prolapse.  H/O adenomatous polyps.  Due every 5 years.  P: Mammogram yearly. BMD due two years. pap smear with neg HR HPV 2013. Pap today. Release of records for labs from Dr. Minna Antis obtained today. return annually or prn

## 2014-11-13 LAB — IPS PAP TEST WITH REFLEX TO HPV

## 2015-06-08 ENCOUNTER — Ambulatory Visit: Payer: Self-pay | Admitting: Sports Medicine

## 2015-06-10 ENCOUNTER — Encounter: Payer: Self-pay | Admitting: Sports Medicine

## 2015-06-10 ENCOUNTER — Ambulatory Visit (INDEPENDENT_AMBULATORY_CARE_PROVIDER_SITE_OTHER): Payer: Managed Care, Other (non HMO) | Admitting: Sports Medicine

## 2015-06-10 VITALS — BP 136/85 | Ht 64.0 in | Wt 140.0 lb

## 2015-06-10 DIAGNOSIS — M7661 Achilles tendinitis, right leg: Secondary | ICD-10-CM | POA: Insufficient documentation

## 2015-06-10 MED ORDER — NITROGLYCERIN 0.2 MG/HR TD PT24: MEDICATED_PATCH | TRANSDERMAL | Status: DC

## 2015-06-10 NOTE — Patient Instructions (Signed)

## 2015-06-10 NOTE — Progress Notes (Signed)
  Alexa [name] - 64 y.o. female MRN [id number]  Date of birth: Jul 23, 1951  CC: No chief complaint on file.   SUBJECTIVE:   HPI Right achilles discomfort: - Minimally if at all sore when active. No significant pain.  - Began Run/walk regimen 3 months.  No explosive movements.  - The knot on her achilles may be getting bigger, but not quickly.  - No injuries in the past - Also does piliates and weight training - She is trying to stretch it with heel lifts.   - no OTC medications - Tried icing without much improvement.  - She also bought OTC fastfeet inserts, which have helped support her arch. She is planning on getting new shoes in the near future.   ROS:     14 point review of systems negative other than that in history of present illness.  HISTORY: Past Medical, Surgical, Social, and Family History Reviewed & Updated per EMR.    OBJECTIVE: BP 136/85 mmHg  Ht 5\' 4"  (1.626 m)  Wt 140 lb (63.504 kg)  BMI 24.02 kg/m2  LMP 11/07/2002  Physical Exam  Gen: Patient, in no acute distress, smiling. Respiratory: Full sentences, non labored breathing. Skin: No rash or other lesions.   Ankle: b/l Visible enlargement 2cm proximal to right achilles insertion Range of motion is minimally restricted with dorsiflexion.   Strength is 5/5 in all directions except eversion, which is 4/5 (chronic following remote right 5th MT surgery).  Stable lateral and medial ligaments. Talar dome nontender; No pain at base of 5th MT; No tenderness over cuboid; No tenderness over N spot or navicular prominence No tenderness on posterior aspects of lateral and medial malleolus No sign of peroneal tendon subluxations; Comfortable, non-altalgic gait.   Imaging: [place] image of the bilateral achilles in both long and short axis obtained. The right Achilles tendon appears as the expected fibrillar pattern measuring 0.40 cm in diameter at insertion into the calcaneus, but 0.80 cm in the mid substance 2 cm proximal to  the insertion. In short axis the right achilles nodule has several hypoechogenic foci suggestive of interstitial tearing. The left Achilles tendon appears as the expected fibrillar pattern measuring 0.51 cm at the thickest portion. In short axis the left achilles has no suggestion of a tear. There is minimal intrasubstance capillary flow with doppler. 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   MEDICATIONS, LABS & OTHER ORDERS: Previous Medications   OMEGA-3 FATTY ACIDS (FISH OIL PO)    Take by mouth daily.   Modified Medications   No medications on file   New Prescriptions   No medications on file   Discontinued Medications   No medications on file  No orders of the defined types were placed in this encounter.   ASSESSMENT & PLAN: See problem based charting & AVS for pt instructions.

## 2015-06-10 NOTE — Assessment & Plan Note (Signed)
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   - Discussed eccentric strengthening and given handout.  - Nitro protocol: 1/4 patch daily  - 5/16" heel lifts placed on insoles - f/u PRN for orthotics (she will be getting new shoes in the next few weeks). - f/u in 1 month

## 2015-06-26 ENCOUNTER — Ambulatory Visit (INDEPENDENT_AMBULATORY_CARE_PROVIDER_SITE_OTHER): Payer: Managed Care, Other (non HMO) | Admitting: Sports Medicine

## 2015-06-26 ENCOUNTER — Encounter: Payer: Self-pay | Admitting: Sports Medicine

## 2015-06-26 VITALS — BP 126/70 | HR 46 | Ht 64.0 in | Wt 140.0 lb

## 2015-06-26 DIAGNOSIS — M7661 Achilles tendinitis, right leg: Secondary | ICD-10-CM | POA: Diagnosis not present

## 2015-06-26 NOTE — Progress Notes (Signed)
   Subjective:    Patient ID: Alexa [name], female    DOB: 01/14/1951, 64 y.o.   MRN: [id number]  HPI   Patient comes in today for follow-up on right Achilles tendinopathy. Overall she is doing better. She has found her heel lifts to be comfortable. The Achilles nodule is decreasing in size. She does admit that she has not been very compliant with her home exercises. Nonetheless she has been able to run. She has purchased a new pair of running shoes.    Review of Systems     Objective:   Physical Exam  Well-developed, well-nourished. No acute distress  Right heel: There is still a palpable nodule along the distal Achilles tendon but it has decreased in size. Slightly tender to palpation. No significant soft tissue swelling. Neurovascular intact distally. Walking without a limp.  Limited MSK ultrasound of the right Achilles was performed. Achilles nodule was measured and compared to the previous scan. It has decreased in size from 0.8 cm to 0.69 cm.      Assessment & Plan:  Improving right Achilles tendinopathy  Continue with current treatment including heel lifts and home exercises. I did inspect her new running shoes. They are a well-designed cushioned running shoe. I've recommended that she try the Superfeet orthotics with the heel lift in her new shoes for the next week or so. If she finds them to be comfortable then she will return to the office in one week for custom orthotics. She will then follow-up 4 weeks after that for a recheck of her Achilles tendinopathy.

## 2015-07-03 ENCOUNTER — Ambulatory Visit (INDEPENDENT_AMBULATORY_CARE_PROVIDER_SITE_OTHER): Payer: Managed Care, Other (non HMO) | Admitting: Sports Medicine

## 2015-07-03 ENCOUNTER — Encounter: Payer: Self-pay | Admitting: Sports Medicine

## 2015-07-03 VITALS — BP 124/72 | HR 62 | Ht 64.0 in | Wt 140.0 lb

## 2015-07-03 DIAGNOSIS — M7661 Achilles tendinitis, right leg: Secondary | ICD-10-CM | POA: Diagnosis not present

## 2015-07-03 NOTE — Progress Notes (Signed)
Patient ID: Alexa [name], female   DOB: 16-Sep-1951, 64 y.o.   MRN: [id number]   Patient comes in today for custom orthotics. Please see the previous office notes for details regarding history and physical exam findings regarding her Achilles tendinopathy.  Patient found the orthotics comfortable prior to leaving the office. 30 minutes was spent with the patient with greater than 50% of the time spent in face-to-face consultation discussing orthotic construction, instruction, and fitting. I did not add heel lifts to her orthotics as this would have pushed her heel out of the back of her running shoe. I instead gave her heel lifts to add to other shoes if she would like. We also went over the proper way to do the [place] heel drop exercises. She appears to be doing these correctly. She will follow-up with me in 4 weeks for reevaluation of her Achilles tendinopathy and repeat ultrasound.  Patient was fitted for a : standard, cushioned, semi-rigid orthotic. The orthotic was heated and afterward the patient stood on the orthotic blank positioned on the orthotic stand. The patient was positioned in subtalar neutral position and 10 degrees of ankle dorsiflexion in a weight bearing stance. After completion of molding, a stable base was applied to the orthotic blank. The blank was ground to a stable position for weight bearing. Size: 8 Base: blue EVA Posting: none Additional orthotic padding: none

## 2015-07-20 ENCOUNTER — Other Ambulatory Visit: Payer: Self-pay | Admitting: Obstetrics & Gynecology

## 2015-07-20 DIAGNOSIS — Z1231 Encounter for screening mammogram for malignant neoplasm of breast: Secondary | ICD-10-CM

## 2015-07-27 ENCOUNTER — Telehealth: Payer: Self-pay | Admitting: Obstetrics & Gynecology

## 2015-07-27 NOTE — Telephone Encounter (Signed)
Spoke with patient. She is planning on mammogram this month. Wants to know if Dr. Sabra Heck would recommend 3D for her. Discussed breast category density C. Advised that Dr. Sabra Heck would recommend 3D for her, gives extra picture of the breast. Patient states [facility] to discuss cost differences. Patient advised typically $50-$150.00 for 3D mammogram however, [facility] will be able to give her exact cost and recommend calling to change appointment to 3D and discuss cost. Patient agreeable.  Routing to provider for final review. Patient agreeable to disposition. Will close encounter.

## 2015-07-27 NOTE — Telephone Encounter (Signed)
Patient calling requesting to speak with the nurse about 3-D mammogram versus regular mammogram.

## 2015-08-04 ENCOUNTER — Other Ambulatory Visit: Payer: Self-pay | Admitting: Obstetrics & Gynecology

## 2015-08-04 ENCOUNTER — Ambulatory Visit (HOSPITAL_COMMUNITY)
Admission: RE | Admit: 2015-08-04 | Discharge: 2015-08-04 | Disposition: A | Discharge status: Home / Self Care | Payer: Managed Care, Other (non HMO) | Source: Ambulatory Visit | Attending: Obstetrics & Gynecology | Admitting: Obstetrics & Gynecology

## 2015-08-04 DIAGNOSIS — Z1231 Encounter for screening mammogram for malignant neoplasm of breast: Secondary | ICD-10-CM | POA: Insufficient documentation

## 2015-08-05 ENCOUNTER — Encounter: Payer: Managed Care, Other (non HMO) | Admitting: Sports Medicine

## 2015-12-23 ENCOUNTER — Emergency Department (HOSPITAL_COMMUNITY)
Admission: EM | Admit: 2015-12-23 | Discharge: 2015-12-23 | Disposition: A | Discharge status: Home / Self Care | Payer: Managed Care, Other (non HMO) | Source: Home / Self Care | Attending: Family Medicine | Admitting: Family Medicine

## 2015-12-23 ENCOUNTER — Encounter (HOSPITAL_COMMUNITY): Payer: Self-pay | Admitting: Emergency Medicine

## 2015-12-23 DIAGNOSIS — J069 Acute upper respiratory infection, unspecified: Secondary | ICD-10-CM

## 2015-12-23 HISTORY — DX: Hyperlipidemia, unspecified: E78.5

## 2015-12-23 NOTE — Discharge Instructions (Signed)
Upper Respiratory Infection, Adult Most upper respiratory infections (URIs) are a viral infection of the air passages leading to the lungs. A URI affects the nose, throat, and upper air passages. The most common type of URI is nasopharyngitis and is typically referred to as "the common cold." URIs run their course and usually go away on their own. Most of the time, a URI does not require medical attention, but sometimes a bacterial infection in the upper airways can follow a viral infection. This is called a secondary infection. Sinus and middle ear infections are common types of secondary upper respiratory infections. Bacterial pneumonia can also complicate a URI. A URI can worsen asthma and chronic obstructive pulmonary disease (COPD). Sometimes, these complications can require emergency medical care and may be life threatening.  CAUSES Almost all URIs are caused by viruses. A virus is a type of germ and can spread from one person to another.  RISKS FACTORS You may be at risk for a URI if:   You smoke.   You have chronic heart or lung disease.  You have a weakened defense (immune) system.   You are very young or very old.   You have nasal allergies or asthma.  You work in crowded or poorly ventilated areas.  You work in health care facilities or schools. SIGNS AND SYMPTOMS  Symptoms typically develop 2-3 days after you come in contact with a cold virus. Most viral URIs last 7-10 days. However, viral URIs from the influenza virus (flu virus) can last 14-18 days and are typically more severe. Symptoms may include:   Runny or stuffy (congested) nose.   Sneezing.   Cough.   Sore throat.   Headache.   Fatigue.   Fever.   Loss of appetite.   Pain in your forehead, behind your eyes, and over your cheekbones (sinus pain).  Muscle aches.  DIAGNOSIS  Your health care provider may diagnose a URI by:  Physical exam.  Tests to check that your symptoms are not due to  another condition such as:  Strep throat.  Sinusitis.  Pneumonia.  Asthma. TREATMENT  A URI goes away on its own with time. It cannot be cured with medicines, but medicines may be prescribed or recommended to relieve symptoms. Medicines may help:  Reduce your fever.  Reduce your cough.  Relieve nasal congestion. HOME CARE INSTRUCTIONS   Take medicines only as directed by your health care provider.   Gargle warm saltwater or take cough drops to comfort your throat as directed by your health care provider.  Use a warm mist humidifier or inhale steam from a shower to increase air moisture. This may make it easier to breathe.  Drink enough fluid to keep your urine clear or pale yellow.   Eat soups and other clear broths and maintain good nutrition.   Rest as needed.   Return to work when your temperature has returned to normal or as your health care provider advises. You may need to stay home longer to avoid infecting others. You can also use a face mask and careful hand washing to prevent spread of the virus.  Increase the usage of your inhaler if you have asthma.   Do not use any tobacco products, including cigarettes, chewing tobacco, or electronic cigarettes. If you need help quitting, ask your health care provider. PREVENTION  The best way to protect yourself from getting a cold is to practice good hygiene.   Avoid oral or hand contact with people with cold  symptoms.   Wash your hands often if contact occurs.  There is no clear evidence that vitamin C, vitamin E, echinacea, or exercise reduces the chance of developing a cold. However, it is always recommended to get plenty of rest, exercise, and practice good nutrition.  SEEK MEDICAL CARE IF:   You are getting worse rather than better.   Your symptoms are not controlled by medicine.   You have chills.  You have worsening shortness of breath.  You have brown or red mucus.  You have yellow or brown nasal  discharge.  You have pain in your face, especially when you bend forward.  You have a fever.  You have swollen neck glands.  You have pain while swallowing.  You have white areas in the back of your throat. SEEK IMMEDIATE MEDICAL CARE IF:   You have severe or persistent:  Headache.  Ear pain.  Sinus pain.  Chest pain.  You have chronic lung disease and any of the following:  Wheezing.  Prolonged cough.  Coughing up blood.  A change in your usual mucus.  You have a stiff neck.  You have changes in your:  Vision.  Hearing.  Thinking.  Mood. MAKE SURE YOU:   Understand these instructions.  Will watch your condition.  Will get help right away if you are not doing well or get worse.   This information is not intended to replace advice given to you by your health care provider. Make sure you discuss any questions you have with your health care provider.   Document Released: 04/19/2001 Document Revised: 03/10/2015 Document Reviewed: 01/29/2014 Elsevier Interactive Patient Education 2016 Elsevier Inc.  Cough, Adult A cough helps to clear your throat and lungs. A cough may last only 2-3 weeks (acute), or it may last longer than 8 weeks (chronic). Many different things can cause a cough. A cough may be a sign of an illness or another medical condition. HOME CARE  Pay attention to any changes in your cough.  Take medicines only as told by your doctor.  If you were prescribed an antibiotic medicine, take it as told by your doctor. Do not stop taking it even if you start to feel better.  Talk with your doctor before you try using a cough medicine.  Drink enough fluid to keep your pee (urine) clear or pale yellow.  If the air is dry, use a cold steam vaporizer or humidifier in your home.  Stay away from things that make you cough at work or at home.  If your cough is worse at night, try using extra pillows to raise your head up higher while you  sleep.  Do not smoke, and try not to be around smoke. If you need help quitting, ask your doctor.  Do not have caffeine.  Do not drink alcohol.  Rest as needed. GET HELP IF:  You have new problems (symptoms).  You cough up yellow fluid (pus).  Your cough does not get better after 2-3 weeks, or your cough gets worse.  Medicine does not help your cough and you are not sleeping well.  You have pain that gets worse or pain that is not helped with medicine.  You have a fever.  You are losing weight and you do not know why.  You have night sweats. GET HELP RIGHT AWAY IF:  You cough up blood.  You have trouble breathing.  Your heartbeat is very fast.   This information is not intended to replace  advice given to you by your health care provider. Make sure you discuss any questions you have with your health care provider. °  °Document Released: 07/07/2011 Document Revised: 07/15/2015 Document Reviewed: 12/31/2014 °Elsevier Interactive Patient Education ©2016 Elsevier Inc. ° °

## 2015-12-23 NOTE — ED Provider Notes (Signed)
CSN: [id number]     Arrival date & time 12/23/15  82 History   First MD Initiated Contact with Patient 12/23/15 1735     Chief Complaint  Patient presents with  . URI   (Consider location/radiation/quality/duration/timing/severity/associated sxs/prior Treatment) HPI Cold symptoms, cough, runny nose, sneezing, no pain  Using OTC meds with some improvement symptoms present since yesterday Past Medical History  Diagnosis Date  . Adenomatous polyp   . Hypertension     off medication  . Family history of anesthesia complication     sister and sister's children postitive for [place]!!!!!!, pt tested negative 3-6yrs ago  . Osteopenia     bilateral hips  . Hyperlipemia    Past Surgical History  Procedure Laterality Date  . Foot surgery      Right   . Colonoscopy  5/13    with biopsy  . Dilatation & currettage/hysteroscopy with resectocope N/A 06/02/2014    Procedure: DILATATION & CURETTAGE/HYSTEROSCOPY WITH RESECTOCOPE;  Surgeon: Lyman Speller, MD;  Location: [facility];  Service: Gynecology;  Laterality: N/A;   Family History  Problem Relation Age of Onset  . Colon cancer Neg Hx   . Anuerysm Father   . Heart disease Father   . Heart attack Mother   . Heart disease Mother   . Kidney disease Mother   . Coronary artery disease Brother   . Hypertension Sister   . Hypertension Sister   . Cancer Brother     Liver/mets from melanoma  . Heart attack Brother   . Diabetes Father   . Kidney cancer Mother     contained tumor  . Ovarian cancer Paternal Grandmother     was PMP  . Cancer Father     unknown type  . Hypertension Brother   . Hypertension Mother   . Hypertension Father   . Allergy (severe) Sister     malignant hypertension   Social History  Substance Use Topics  . Smoking status: Never Smoker   . Smokeless tobacco: Never Used  . Alcohol Use: 0.0 oz/week    0 Standard drinks or equivalent per week     Comment: once a month   OB History    Gravida Para Term  Preterm AB TAB SAB Ectopic Multiple Living   1 0        0     Review of Systems Cold symptoms, sinus pressure Allergies  Review of patient's allergies indicates no known allergies.  Home Medications   Prior to Admission medications   Medication Sig Start Date End Date Taking? Authorizing Provider  simvastatin (ZOCOR) 20 MG tablet Take 20 mg by mouth daily.   Yes Historical Provider, MD  valsartan-hydrochlorothiazide (DIOVAN-HCT) 160-12.5 MG tablet Take 1 tablet by mouth daily.   Yes Historical Provider, MD  cholecalciferol (VITAMIN D) 1000 UNITS tablet Take 1,000 Units by mouth daily.    Historical Provider, MD  nitroGLYCERIN (NITRODUR - DOSED IN MG/24 HR) 0.2 mg/hr patch Place 1/4 patch on affected area daily 06/10/15   Thurman Coyer, DO  Omega-3 Fatty Acids (FISH OIL PO) Take by mouth daily.    Historical Provider, MD   Meds Ordered and Administered this Visit  Medications - No data to display  BP 119/68 mmHg  Pulse 83  Temp(Src) 98.9 F (37.2 C) (Oral)  Resp 16  SpO2 98%  LMP 11/07/2002 No data found.   Physical Exam  Constitutional: She is oriented to person, place, and time. She appears well-developed and well-nourished.  HENT:  Head: Normocephalic and atraumatic.  Right Ear: External ear normal.  Left Ear: External ear normal.  Mouth/Throat: Oropharynx is clear and moist.  Eyes: Conjunctivae are normal.  Neck: Normal range of motion. Neck supple.  Pulmonary/Chest: Effort normal and breath sounds normal. She exhibits tenderness.  Abdominal: Soft.  Musculoskeletal: Normal range of motion.  Neurological: She is alert and oriented to person, place, and time.  Skin: Skin is warm and dry.  Psychiatric: She has a normal mood and affect. Her behavior is normal.  Nursing note and vitals reviewed.   ED Course  Procedures (including critical care time)  Labs Review Labs Reviewed - No data to display  Imaging Review No results found.   Visual Acuity  Review  Right Eye Distance:   Left Eye Distance:   Bilateral Distance:    Right Eye Near:   Left Eye Near:    Bilateral Near:         MDM   1. Acute URI    Patient is advised to continue home symptomatic treatment.  Patient is advised that if there are new or worsening symptoms or attend the emergency department, or contact primary care provider. Instructions of care provided discharged home in stable condition. Return to work/school note provided.  THIS NOTE WAS GENERATED USING A VOICE RECOGNITION SOFTWARE PROGRAM. ALL REASONABLE EFFORTS  WERE MADE TO PROOFREAD THIS DOCUMENT FOR ACCURACY.     Konrad Felix, [place] 12/23/15 [id number]

## 2015-12-23 NOTE — ED Notes (Signed)
Pt has been suffering from nasal congestion since Monday night.  She states she sneezed "probably 150 times yesterday" and started with some mild body aches today.  She does not know if she has had a fever but has had some mild chills today.

## 2016-01-19 ENCOUNTER — Ambulatory Visit (INDEPENDENT_AMBULATORY_CARE_PROVIDER_SITE_OTHER): Payer: Managed Care, Other (non HMO) | Admitting: Obstetrics & Gynecology

## 2016-01-19 ENCOUNTER — Encounter: Payer: Self-pay | Admitting: Obstetrics & Gynecology

## 2016-01-19 VITALS — BP 110/76 | HR 60 | Resp 14 | Ht 62.75 in | Wt 148.0 lb

## 2016-01-19 DIAGNOSIS — Z01419 Encounter for gynecological examination (general) (routine) without abnormal findings: Secondary | ICD-10-CM

## 2016-01-19 DIAGNOSIS — Z205 Contact with and (suspected) exposure to viral hepatitis: Secondary | ICD-10-CM | POA: Diagnosis not present

## 2016-01-19 DIAGNOSIS — Z0000 Encounter for general adult medical examination without abnormal findings: Secondary | ICD-10-CM

## 2016-01-19 NOTE — Progress Notes (Signed)
65 y.o. G1P0 SingleCaucasianF here for annual exam.  Doing well.  Pt is seeing Dr. Andria Meuse PA, Dustin Folks.  Pt has been put on cholesterol and BP meds.  She's working on weight loss.    No vaginal bleeding.    Patient's last menstrual period was 11/07/2002.          Sexually active: Yes.    The current method of family planning is post menopausal status.    Exercising: Yes.    Pilates, walking, weights Smoker:  no  Health Maintenance: Pap:  11/10/14 Neg. 09/05/12 Neg. HR HPV:neg History of abnormal Pap:  yes MMG:  08/06/15 BIRADS1:neg Colonoscopy:  03/23/12 Polyps - Repeat 5 years  BMD:   07/29/14 Osteopenia  TDaP:  2011 Screening Labs: PCP, Urine today: PCP   reports that she has never smoked. She has never used smokeless tobacco. She reports that she drinks about 0.6 oz of alcohol per week. She reports that she does not use illicit drugs.  Past Medical History  Diagnosis Date  . Adenomatous polyp   . Hypertension     off medication  . Family history of anesthesia complication     sister and sister's children postitive for [place]!!!!!!, pt tested negative 3-62yrs ago  . Osteopenia     bilateral hips  . Hyperlipemia     Past Surgical History  Procedure Laterality Date  . Foot surgery      Right   . Colonoscopy  5/13    with biopsy  . Dilatation & currettage/hysteroscopy with resectocope N/A 06/02/2014    Procedure: DILATATION & CURETTAGE/HYSTEROSCOPY WITH RESECTOCOPE;  Surgeon: Lyman Speller, MD;  Location: [facility];  Service: Gynecology;  Laterality: N/A;    Current Outpatient Prescriptions  Medication Sig Dispense Refill  . Lactobacillus (PROBIOTIC ACIDOPHILUS PO) Take by mouth daily.    . simvastatin (ZOCOR) 20 MG tablet Take 20 mg by mouth daily.    . valsartan-hydrochlorothiazide (DIOVAN-HCT) 160-12.5 MG tablet Take 1 tablet by mouth daily.     No current facility-administered medications for this visit.    Family History  Problem Relation Age of Onset  .  Colon cancer Neg Hx   . Anuerysm Father   . Heart disease Father   . Heart attack Mother   . Heart disease Mother   . Kidney disease Mother   . Coronary artery disease Brother   . Hypertension Sister   . Hypertension Sister   . Cancer Brother     Liver/mets from melanoma  . Heart attack Brother   . Diabetes Father   . Kidney cancer Mother     contained tumor  . Ovarian cancer Paternal Grandmother     was PMP  . Cancer Father     unknown type  . Hypertension Brother   . Hypertension Mother   . Hypertension Father   . Allergy (severe) Sister     malignant hypertension    ROS:  Pertinent items are noted in HPI.  Otherwise, a comprehensive ROS was negative.  Exam:   BP 110/76 mmHg  Pulse 60  Resp 14  Ht 5' 2.75" (1.594 m)  Wt 148 lb (67.132 kg)  BMI 26.42 kg/m2  LMP 11/07/2002  Weight change: +5#  Height: 5' 2.75" (159.4 cm)  Ht Readings from Last 3 Encounters:  01/19/16 5' 2.75" (1.594 m)  07/03/15 5\' 4"  (1.626 m)  06/26/15 5\' 4"  (1.626 m)    General appearance: alert, cooperative and appears stated age Head: Normocephalic, without obvious  abnormality, atraumatic Neck: no adenopathy, supple, symmetrical, trachea midline and thyroid normal to inspection and palpation Lungs: clear to auscultation bilaterally Breasts: normal appearance, no masses or tenderness Heart: regular rate and rhythm Abdomen: soft, non-tender; bowel sounds normal; no masses,  no organomegaly Extremities: extremities normal, atraumatic, no cyanosis or edema Skin: Skin color, texture, turgor normal. No rashes or lesions Lymph nodes: Cervical, supraclavicular, and axillary nodes normal. No abnormal inguinal nodes palpated Neurologic: Grossly normal   Pelvic: External genitalia:  no lesions              Urethra:  normal appearing urethra with no masses, tenderness or lesions              Bartholins and Skenes: normal                 Vagina: normal appearing vagina with normal color and  discharge, no lesions              Cervix: no lesions              Pap taken: No. Bimanual Exam:  Uterus:  normal size, contour, position, consistency, mobility, non-tender              Adnexa: normal adnexa and no mass, fullness, tenderness               Rectovaginal: Confirms               Anus:  normal sphincter tone, no lesions  Chaperone was present for exam.  A:  Well Woman with normal exam H/O adenomatous polyps. Due every 5 years.  P: Mammogram yearly BMD due two years pap smear with neg HR HPV 2013. Pap 2016.   Pt seeing Dr. Rachell Cipro as new PCP this year.  Has appt.   D/W pt having Hep C testing today. return annually or prn

## 2016-01-20 LAB — HEPATITIS C ANTIBODY: HCV Ab: NEGATIVE

## 2016-06-20 ENCOUNTER — Ambulatory Visit: Payer: Managed Care, Other (non HMO) | Admitting: Sports Medicine

## 2016-07-28 ENCOUNTER — Other Ambulatory Visit: Payer: Self-pay | Admitting: *Deleted

## 2016-07-28 ENCOUNTER — Encounter: Payer: Self-pay | Admitting: Sports Medicine

## 2016-07-28 ENCOUNTER — Ambulatory Visit (INDEPENDENT_AMBULATORY_CARE_PROVIDER_SITE_OTHER): Payer: Managed Care, Other (non HMO) | Admitting: Sports Medicine

## 2016-07-28 VITALS — BP 134/62 | HR 58 | Ht 64.0 in | Wt 140.0 lb

## 2016-07-28 DIAGNOSIS — M5430 Sciatica, unspecified side: Secondary | ICD-10-CM | POA: Insufficient documentation

## 2016-07-28 DIAGNOSIS — M6248 Contracture of muscle, other site: Secondary | ICD-10-CM | POA: Diagnosis not present

## 2016-07-28 DIAGNOSIS — M62838 Other muscle spasm: Secondary | ICD-10-CM

## 2016-07-28 DIAGNOSIS — M5431 Sciatica, right side: Secondary | ICD-10-CM | POA: Diagnosis not present

## 2016-07-28 MED ORDER — METHYLPREDNISOLONE ACETATE 80 MG/ML IJ SUSP
80.0000 mg | Freq: Once | INTRAMUSCULAR | Status: AC
  Administered 2016-07-28: 80 mg via INTRAMUSCULAR

## 2016-07-28 MED ORDER — PREDNISONE 10 MG PO TABS: ORAL_TABLET | ORAL | 0 refills | Status: DC

## 2016-07-28 NOTE — Assessment & Plan Note (Addendum)
Denies any low back pain and this has been previously resolved with exercises and stretching of the hip abductors. Will treat with a IM steroid injection and steroid Dosepak.  If still having symptoms, would recommend reevaluation and wIll get x-rays at that time.

## 2016-07-28 NOTE — Progress Notes (Signed)
  [employer] - 65 y.o. female MRN [id number]  Date of birth: [birth year]  SUBJECTIVE:  Including CC & ROS.  CC: sciatic nerve pain Presents with pain that radiates from her gluteal region down the lateral aspect of her right leg. Began 2.5 months ago but resolved with piriformis and gluteus stretching and strengthening exercises after about a month. But then 2 weeks ago she went to the beach and sitting on the chair flared it up again. She has intermittent pain that goes down her leg and describes it as a burning pain. She denies any numbness or tingling. She denies any saddle anesthesia, fevers, chills, bowel or bladder incontinence. She had this about 15 years ago and was diagnosed with piriformis syndrome. Her x-rays and MRI were negative at that time. She has not had any new x-rays.  She believe she has been favoring her right side and now it has started on her left side.   ROS: No unexpected weight loss, fever, chills, swelling, instability, muscle pain, numbness/tingling, redness, otherwise see HPI   PMHx - Updated and reviewed.  Contributory factors include: HTN PSHx - Updated and reviewed.  Contributory factors include:  Negative FHx - Updated and reviewed.  Contributory factors include:  Negative Social Hx - Updated and reviewed. Contributory factors include: Negative Medications - reviewed   DATA REVIEWED: Previous office visits  PHYSICAL EXAM:  VS: BP:134/62  HR:(!) 58bpm  TEMP: ( )  RESP:   HT:5\' 4"  (162.6 cm)   WT:140 lb (63.5 kg)  BMI:24.1 PHYSICAL EXAM: Gen: NAD, alert, cooperative with exam, well-appearing HEENT: clear conjunctiva,  CV:  no edema, capillary refill brisk, normal rate Resp: non-labored Skin: no rashes, normal turgor  Neuro: no gross deficits.  Psych:  alert and oriented  Back Exam:  Inspection: Unremarkable  Palpable tenderness: None. Range of Motion:  Flexion 45 deg; Extension 45 deg; Side Bending to 45 deg bilaterally; Rotation to 45 deg  bilaterally  Leg strength: Quad: 5/5 Hamstring: 5/5 Hip flexor: 5/5 Hip abductors: 4/5 on right, 5/5 on left  Strength at foot: Plantar-flexion: 5/5 Dorsi-flexion: 5/5 Eversion: 5/5 Inversion: 5/5  Sensory change: Gross sensation intact to all lumbar and sacral dermatomes.  Reflexes: 2+ at both patellar tendons, 2+ at achilles tendons, Babinski's downgoing.  Gait unremarkable. SLR laying: Negative  XSLR laying: Negative  FABER: negative. Mild tenderness palpation over right piriformis.   ASSESSMENT & PLAN:   Sciatic nerve pain Denies any low back pain and this has been previously resolved with exercises and stretching of the hip abductors. Will treat with a IM steroid injection and steroid Dosepak.  If still having symptoms, would recommend reevaluation and wIll get x-rays at that time.

## 2016-07-28 NOTE — Patient Instructions (Signed)
Dr. Morrison [facility] [address] [place] [address]  Phone: [phone]

## 2016-08-09 ENCOUNTER — Other Ambulatory Visit: Payer: Self-pay | Admitting: Obstetrics and Gynecology

## 2016-08-09 DIAGNOSIS — Z1231 Encounter for screening mammogram for malignant neoplasm of breast: Secondary | ICD-10-CM

## 2016-08-11 DIAGNOSIS — C8409 Mycosis fungoides, extranodal and solid organ sites: Secondary | ICD-10-CM

## 2016-08-11 DIAGNOSIS — C8400 Mycosis fungoides, unspecified site: Secondary | ICD-10-CM | POA: Insufficient documentation

## 2016-08-19 ENCOUNTER — Ambulatory Visit
Admission: RE | Admit: 2016-08-19 | Discharge: 2016-08-19 | Disposition: A | Discharge status: Home / Self Care | Payer: Managed Care, Other (non HMO) | Source: Ambulatory Visit | Attending: Obstetrics and Gynecology | Admitting: Obstetrics and Gynecology

## 2016-08-19 DIAGNOSIS — Z1231 Encounter for screening mammogram for malignant neoplasm of breast: Secondary | ICD-10-CM

## 2016-12-12 ENCOUNTER — Ambulatory Visit
Admission: RE | Admit: 2016-12-12 | Discharge: 2016-12-12 | Disposition: A | Discharge status: Home / Self Care | Payer: Managed Care, Other (non HMO) | Source: Ambulatory Visit | Attending: Internal Medicine | Admitting: Internal Medicine

## 2016-12-12 ENCOUNTER — Other Ambulatory Visit: Payer: Self-pay | Admitting: Internal Medicine

## 2016-12-12 DIAGNOSIS — R490 Dysphonia: Secondary | ICD-10-CM

## 2016-12-23 DIAGNOSIS — K219 Gastro-esophageal reflux disease without esophagitis: Secondary | ICD-10-CM | POA: Insufficient documentation

## 2017-01-24 ENCOUNTER — Other Ambulatory Visit: Payer: Self-pay | Admitting: Internal Medicine

## 2017-01-24 DIAGNOSIS — R55 Syncope and collapse: Secondary | ICD-10-CM

## 2017-01-26 NOTE — Progress Notes (Signed)
66 y.o. G1P0 Single Caucasian F here for annual exam.  Doing well.  No vaginal bleeding.    Having carotid dopplers and stress test in the next three weeks.  Had episode of dizziness and pre-syncopal feeling.  Has strong family hx and seeing Dr. Einar Gip currently.  GI symptoms are much better over the last few months.   PCP:  Dr. Lyndee Leo.  Lab work will be done in May.     Patient's last menstrual period was 11/07/2002.          Sexually active: No.  The current method of family planning is post menopausal status.    Exercising: Yes.    pilates, weight bearing Smoker:  no  Health Maintenance: Pap:  11/10/14 negative, 09/05/12 negative, HR HPV negative  History of abnormal Pap:  yes MMG:  08/19/16 BIRADS 1 negative  Colonoscopy:  03/23/12 polyp- repeat 5 years  BMD:   07/29/14 osteopenia in hips- repeat 2 years  TDaP:  2011 Pneumonia vaccine(s):  never Zostavax:   never Hep C testing: 01/19/16 negative  Screening Labs: PCP, Hb today: PCP   reports that she has never smoked. She has never used smokeless tobacco. She reports that she drinks about 0.6 oz of alcohol per week . She reports that she does not use drugs.  Past Medical History:  Diagnosis Date  . Adenomatous polyp   . Family history of anesthesia complication    sister and sister's children postitive for [place]!!!!!!, pt tested negative 3-26yrs ago  . Hyperlipemia   . Hypertension    off medication  . Osteopenia    bilateral hips    Past Surgical History:  Procedure Laterality Date  . COLONOSCOPY  5/13   with biopsy  . DILATATION & CURRETTAGE/HYSTEROSCOPY WITH RESECTOCOPE N/A 06/02/2014   Procedure: [place];  Surgeon: Lyman Speller, MD;  Location: [facility];  Service: Gynecology;  Laterality: N/A;  . FOOT SURGERY     Right     Current Outpatient Prescriptions  Medication Sig Dispense Refill  . amLODipine (NORVASC) 5 MG tablet     . Cholecalciferol (VITAMIN D PO) Take by  mouth.    . clobetasol cream (TEMOVATE) 0.05 % Apply once daily to rash    . Lactobacillus (PROBIOTIC ACIDOPHILUS PO) Take by mouth daily.    Marland Kitchen omeprazole (PRILOSEC) 40 MG capsule TAKE 1 CAPSULE BY MOUTH ONCE DAILY    . rosuvastatin (CRESTOR) 10 MG tablet Take 10 mg by mouth daily.  0   No current facility-administered medications for this visit.     Family History  Problem Relation Age of Onset  . Anuerysm Father   . Heart disease Father   . Diabetes Father   . Cancer Father     unknown type  . Hypertension Father   . Heart attack Mother   . Heart disease Mother   . Kidney disease Mother   . Kidney cancer Mother     contained tumor  . Hypertension Mother   . Heart attack Brother   . Cancer Brother     liver cancer  . Melanoma Brother   . Other Brother     Bypass surgery   . Hypertension Sister   . Ovarian cancer Paternal Grandmother     was PMP  . Colon cancer Neg Hx     ROS:  Pertinent items are noted in HPI.  Otherwise, a comprehensive ROS was negative.  Exam:   BP (!) 152/84 (BP Location: Right  Arm, Patient Position: Sitting, Cuff Size: Normal)   Pulse 66   Resp 14   Ht 5' 2.5" (1.588 m)   Wt 151 lb (68.5 kg)   LMP 11/07/2002   BMI 27.18 kg/m   Weight change:  +3#  Height: 5' 2.5" (158.8 cm)  Ht Readings from Last 3 Encounters:  01/27/17 5' 2.5" (1.588 m)  07/28/16 5\' 4"  (1.626 m)  01/19/16 5' 2.75" (1.594 m)   General appearance: alert, cooperative and appears stated age Head: Normocephalic, without obvious abnormality, atraumatic Neck: no adenopathy, supple, symmetrical, trachea midline and thyroid normal to inspection and palpation Lungs: clear to auscultation bilaterally Breasts: normal appearance, no masses or tenderness Heart: regular rate and rhythm Abdomen: soft, non-tender; bowel sounds normal; no masses,  no organomegaly Extremities: extremities normal, atraumatic, no cyanosis or edema Skin: Skin color, texture, turgor normal. No rashes or  lesions Lymph nodes: Cervical, supraclavicular, and axillary nodes normal. No abnormal inguinal nodes palpated Neurologic: Grossly normal  Pelvic: External genitalia:  no lesions              Urethra:  normal appearing urethra with no masses, tenderness or lesions              Bartholins and Skenes: normal                 Vagina: normal appearing vagina with normal color and discharge, no lesions              Cervix: no lesions              Pap taken: Yes.   Bimanual Exam:  Uterus:  normal size, contour, position, consistency, mobility, non-tender              Adnexa: normal adnexa and no mass, fullness, tenderness               Rectovaginal: Confirms               Anus:  normal sphincter tone, no lesions  Chaperone was present for exam.  A:  Well Woman with normal exam PMP, no HRT H/O colon polyps GERD Hypertension Elevated lipids  P:   Mammogram guidelines reviewed pap smear with HR HPV obtained today BMD due next year.  Order placed.   Lab work will be done with Dr. Lyndee Leo with physical in May Return annually or prn

## 2017-01-27 ENCOUNTER — Other Ambulatory Visit (HOSPITAL_COMMUNITY)
Admission: RE | Admit: 2017-01-27 | Discharge: 2017-01-27 | Disposition: A | Discharge status: Home / Self Care | Payer: Managed Care, Other (non HMO) | Source: Ambulatory Visit | Attending: Obstetrics & Gynecology | Admitting: Obstetrics & Gynecology

## 2017-01-27 ENCOUNTER — Ambulatory Visit (INDEPENDENT_AMBULATORY_CARE_PROVIDER_SITE_OTHER): Payer: Managed Care, Other (non HMO) | Admitting: Obstetrics & Gynecology

## 2017-01-27 ENCOUNTER — Encounter: Payer: Self-pay | Admitting: Obstetrics & Gynecology

## 2017-01-27 VITALS — BP 152/84 | HR 66 | Resp 14 | Ht 62.5 in | Wt 151.0 lb

## 2017-01-27 DIAGNOSIS — Z124 Encounter for screening for malignant neoplasm of cervix: Secondary | ICD-10-CM

## 2017-01-27 DIAGNOSIS — M8580 Other specified disorders of bone density and structure, unspecified site: Secondary | ICD-10-CM

## 2017-01-27 DIAGNOSIS — Z01419 Encounter for gynecological examination (general) (routine) without abnormal findings: Secondary | ICD-10-CM | POA: Diagnosis not present

## 2017-01-27 NOTE — Patient Instructions (Signed)
Remember to schedule your bone density testing with your next mammogram

## 2017-01-30 ENCOUNTER — Ambulatory Visit
Admission: RE | Admit: 2017-01-30 | Discharge: 2017-01-30 | Disposition: A | Discharge status: Home / Self Care | Payer: Managed Care, Other (non HMO) | Source: Ambulatory Visit | Attending: Internal Medicine | Admitting: Internal Medicine

## 2017-01-30 DIAGNOSIS — R55 Syncope and collapse: Secondary | ICD-10-CM

## 2017-01-31 LAB — CYTOLOGY - PAP
Diagnosis: NEGATIVE
HPV: NOT DETECTED

## 2017-02-02 ENCOUNTER — Encounter: Payer: Self-pay | Admitting: Gastroenterology

## 2017-04-07 DIAGNOSIS — C20 Malignant neoplasm of rectum: Secondary | ICD-10-CM

## 2017-04-07 HISTORY — DX: Malignant neoplasm of rectum: C20

## 2017-05-05 ENCOUNTER — Ambulatory Visit: Payer: Managed Care, Other (non HMO) | Admitting: Obstetrics & Gynecology

## 2017-05-11 ENCOUNTER — Other Ambulatory Visit: Payer: Self-pay | Admitting: Gastroenterology

## 2017-05-11 DIAGNOSIS — R933 Abnormal findings on diagnostic imaging of other parts of digestive tract: Secondary | ICD-10-CM

## 2017-05-11 DIAGNOSIS — C20 Malignant neoplasm of rectum: Secondary | ICD-10-CM

## 2017-05-11 NOTE — Progress Notes (Signed)
Laron Angelini MD 

## 2017-05-19 ENCOUNTER — Ambulatory Visit
Admission: RE | Admit: 2017-05-19 | Discharge: 2017-05-19 | Disposition: A | Discharge status: Home / Self Care | Payer: Managed Care, Other (non HMO) | Source: Ambulatory Visit | Attending: Gastroenterology | Admitting: Gastroenterology

## 2017-05-19 DIAGNOSIS — R933 Abnormal findings on diagnostic imaging of other parts of digestive tract: Secondary | ICD-10-CM

## 2017-05-19 DIAGNOSIS — C20 Malignant neoplasm of rectum: Secondary | ICD-10-CM

## 2017-05-19 MED ORDER — IOPAMIDOL (ISOVUE-300) INJECTION 61%
100.0000 mL | Freq: Once | INTRAVENOUS | Status: AC | PRN
  Administered 2017-05-19: 100 mL via INTRAVENOUS

## 2017-05-23 ENCOUNTER — Other Ambulatory Visit: Payer: Self-pay | Admitting: General Surgery

## 2017-05-23 NOTE — H&P (Signed)
History of Present Illness Alexa Ruff MD; 5/46/2703 10:20 AM) The patient is a 66 year old female who presents with colorectal cancer. 65 year old female who presents to the office for evaluation of a newly diagnosed rectal carcinoma. She underwent a screening colonoscopy with Dr. Collene Mares. An 8 mm distal rectal polyp was removed via hot snare resection. Biopsies show rectal carcinoma with IHC staining more consistent with adenocarcinoma. Margins were involved. She has underwent CT scans which show no signs of metastatic disease. She is currently asymptomatic and denies any pain or rectal bleeding or weight loss.   Past Surgical History Malachy Moan, [place]; 05/23/2017 9:42 AM) Colon Polyp Removal - Colonoscopy Foot Surgery Right. Oral Surgery  Diagnostic Studies History Malachy Moan, [place]; 05/23/2017 9:42 AM) Colonoscopy within last year Mammogram within last year Pap Smear 1-5 years ago  Allergies Malachy Moan, RMA; 05/23/2017 9:44 AM) Ebbie Ridge *NASAL AGENTS - SYSTEMIC AND TOPICAL* palp  Medication History Malachy Moan, RMA; 05/23/2017 9:45 AM) AmLODIPine Besylate (5MG  Tablet, Oral) Active. Clobetasol Propionate (0.05% Cream, External) Active. Rosuvastatin Calcium (10MG  Tablet, Oral) Active. Vitamin D (Cholecalciferol) (Oral) Specific strength unknown - Active. Lactobacillus (Oral) Specific strength unknown - Active. Omeprazole (40MG  Capsule ER, Oral) Active. Medications Reconciled  Social History Malachy Moan, [place]; 05/23/2017 9:42 AM) Alcohol use Occasional alcohol use. Caffeine use Coffee. No drug use Tobacco use Never smoker.  Family History Malachy Moan, [place]; 05/23/2017 9:42 AM) Alcohol Abuse Father. Anesthetic complications Sister. Arthritis Mother. Cancer Mother. Diabetes Mellitus Father. Heart Disease Brother, Father, Mother. Heart disease in female family member before age 60 Heart disease in female family member  before age 73 Hypertension Brother, Father, Mother. Melanoma Brother. Prostate Cancer Father.  Pregnancy / Birth History Malachy Moan, [place]; 05/23/2017 9:43 AM) Age at menarche 24 years. Age of menopause 51-55 Contraceptive History Oral contraceptives. Gravida 0 Para 0  Other Problems Malachy Moan, [place]; 05/23/2017 9:42 AM) Anxiety Disorder Gastroesophageal Reflux Disease Hemorrhoids Hepatitis High blood pressure Hypercholesterolemia Melanoma Other disease, cancer, significant illness     Review of Systems Malachy Moan RMA; 05/23/2017 9:43 AM) HEENT Present- Hoarseness, Seasonal Allergies and Wears glasses/contact lenses. Not Present- Earache, Hearing Loss, Nose Bleed, Oral Ulcers, Ringing in the Ears, Sinus Pain, Sore Throat, Visual Disturbances and Yellow Eyes. Respiratory Not Present- Bloody sputum, Chronic Cough, Difficulty Breathing, Snoring and Wheezing. Breast Not Present- Breast Mass, Breast Pain, Nipple Discharge and Skin Changes. Cardiovascular Present- Palpitations. Not Present- Chest Pain, Difficulty Breathing Lying Down, Leg Cramps, Rapid Heart Rate, Shortness of Breath and Swelling of Extremities. Gastrointestinal Present- Hemorrhoids and Indigestion. Not Present- Abdominal Pain, Bloating, Bloody Stool, Change in Bowel Habits, Chronic diarrhea, Constipation, Difficulty Swallowing, Excessive gas, Gets full quickly at meals, Nausea, Rectal Pain and Vomiting. Female Genitourinary Not Present- Frequency, Nocturia, Painful Urination, Pelvic Pain and Urgency. Musculoskeletal Not Present- Back Pain, Joint Pain, Joint Stiffness, Muscle Pain, Muscle Weakness and Swelling of Extremities. Neurological Not Present- Decreased Memory, Fainting, Headaches, Numbness, Seizures, Tingling, Tremor, Trouble walking and Weakness. Psychiatric Present- Anxiety. Not Present- Bipolar, Change in Sleep Pattern, Depression, Fearful and Frequent crying. Endocrine Not  Present- Cold Intolerance, Excessive Hunger, Hair Changes, Heat Intolerance, Hot flashes and New Diabetes. Hematology Not Present- Blood Thinners, Easy Bruising, Excessive bleeding, Gland problems, HIV and Persistent Infections.  Vitals Malachy Moan RMA; 05/23/2017 9:45 AM) 05/23/2017 9:45 AM Weight: 146.2 lb Height: 63.5in Body Surface Area: 1.7 m Body Mass Index: 25.49 kg/m  Temp.: 98.51F  Pulse: 60 (Regular)  BP: 156/90 (Sitting, Left Arm, Standard)  Physical Exam Alexa Ruff MD; 01/05/3142 10:21 AM)  General Mental Status-Alert. General Appearance-Not in acute distress. Build & Nutrition-Well nourished. Posture-Normal posture. Gait-Normal.  Head and Neck Head-normocephalic, atraumatic with no lesions or palpable masses. Trachea-midline.  Chest and Lung Exam Chest and lung exam reveals -on auscultation, normal breath sounds, no adventitious sounds and normal vocal resonance.  Cardiovascular Cardiovascular examination reveals -normal heart sounds, regular rate and rhythm with no murmurs and no digital clubbing, cyanosis, edema, increased warmth or tenderness.  Abdomen Inspection Inspection of the abdomen reveals - No Hernias. Palpation/Percussion Palpation and Percussion of the abdomen reveal - Soft, Non Tender, No Rigidity (guarding), No hepatosplenomegaly and No Palpable abdominal masses.  Rectal Anorectal Exam External - normal external exam. Internal - normal internal exam.  Neurologic Neurologic evaluation reveals -alert and oriented x 3 with no impairment of recent or remote memory, normal attention span and ability to concentrate, normal sensation and normal coordination.  Musculoskeletal Normal Exam - Bilateral-Upper Extremity Strength Normal and Lower Extremity Strength Normal.   Results Alexa Ruff MD; 8/88/7579 10:19 AM) Procedures  Name Value Date ANOSCOPY, DIAGNOSTIC (72820) [ Hemorrhoids  ] Procedure Other: Procedure: Anoscopy Surgeon: Marcello Moores After the risks and benefits were explained, verbal consent was obtained for above procedure. A medical assistant chaperone was present thoroughout the entire procedure. Anesthesia: none Diagnosis: rectal carcinoma Findings: There is a ulceration noted approximately 3-4 mm from the dentate line just to the right of the anterior midline that is presumably the site of endoscopic resection.  Performed: 05/23/2017 10:17 AM    Assessment & Plan Alexa Ruff MD; 04/07/5614 10:23 AM)  RECTAL CARCINOMA (C20) Impression: 66 year old female with a history of distal rectal polyp resection during colonoscopy approximately 3 weeks ago. This was noted to be a poorly differentiated carcinoma on pathology. Immunohistochemistry staining shows signs more consistent with adenocarcinoma. On exam today the patient has an ulcer at the anterior midline approximately 3-4 mm from the dentate line. This appears to be the source of polypectomy. CT scan showed no signs of metastatic disease. I have recommended a transanal excision of the surgical scar for a more adequate biopsy and clear margins. We will then review the specimen to determine her staging. She will also need a rectal MRI and CEA level at some point in the future.

## 2017-05-26 ENCOUNTER — Encounter (HOSPITAL_BASED_OUTPATIENT_CLINIC_OR_DEPARTMENT_OTHER): Payer: Self-pay | Admitting: *Deleted

## 2017-05-26 NOTE — Progress Notes (Signed)
Pt instructed npo p mn x clear liquids until 0800.  thenabsolutely nothing by mouth x norvasc, prilosec w sip of water.  To [facility] 8/3 @ 1245.  Needs istat ,? ekg on arrival.

## 2017-06-08 NOTE — Progress Notes (Addendum)
PT CASE MOVED FROM 1415 TO 1400.  CALLED AND LM VIA PHONE TO ARRIVE AT 1200 INSTEAD OF 1245 AND STILL CAN HAVE CLEAR LIQUIDS FROM MN TO 0800 AS PREVIOUSLY INSTRUCTED.  PT ASKED TO CALL BACK [phone] TO CONFIRM SHE RECEIVED MESSAGE.  ADDENDUM:  PT CALLED BACK VIA PHONE , STATED WILL BE HERE AT 1200 AND UNDERSTOOD NPO AFTER MN W/ EXCEPTION CLEAR LIQUIDS UNTIL 0800.

## 2017-06-09 ENCOUNTER — Encounter (HOSPITAL_BASED_OUTPATIENT_CLINIC_OR_DEPARTMENT_OTHER)
Admission: RE | Disposition: A | Discharge status: Home / Self Care | Payer: Self-pay | Source: Ambulatory Visit | Attending: General Surgery

## 2017-06-09 ENCOUNTER — Ambulatory Visit (HOSPITAL_BASED_OUTPATIENT_CLINIC_OR_DEPARTMENT_OTHER): Payer: Managed Care, Other (non HMO) | Admitting: Anesthesiology

## 2017-06-09 ENCOUNTER — Encounter (HOSPITAL_BASED_OUTPATIENT_CLINIC_OR_DEPARTMENT_OTHER): Payer: Self-pay

## 2017-06-09 ENCOUNTER — Ambulatory Visit (HOSPITAL_BASED_OUTPATIENT_CLINIC_OR_DEPARTMENT_OTHER)
Admission: RE | Admit: 2017-06-09 | Discharge: 2017-06-09 | Disposition: A | Discharge status: Home / Self Care | Payer: Managed Care, Other (non HMO) | Source: Ambulatory Visit | Attending: General Surgery | Admitting: General Surgery

## 2017-06-09 DIAGNOSIS — Z79899 Other long term (current) drug therapy: Secondary | ICD-10-CM | POA: Diagnosis not present

## 2017-06-09 DIAGNOSIS — K219 Gastro-esophageal reflux disease without esophagitis: Secondary | ICD-10-CM | POA: Insufficient documentation

## 2017-06-09 DIAGNOSIS — I10 Essential (primary) hypertension: Secondary | ICD-10-CM | POA: Insufficient documentation

## 2017-06-09 DIAGNOSIS — C20 Malignant neoplasm of rectum: Secondary | ICD-10-CM | POA: Diagnosis not present

## 2017-06-09 HISTORY — DX: Gastro-esophageal reflux disease without esophagitis: K21.9

## 2017-06-09 HISTORY — DX: Malignant (primary) neoplasm, unspecified: C80.1

## 2017-06-09 HISTORY — PX: TRANSANAL EXCISION OF RECTAL MASS: SHX6134

## 2017-06-09 LAB — POCT I-STAT 4, (NA,K, GLUC, HGB,HCT)
Glucose, Bld: 101 mg/dL — ABNORMAL HIGH (ref 65–99)
HCT: 41 % (ref 36.0–46.0)
Hemoglobin: 13.9 g/dL (ref 12.0–15.0)
Potassium: 3.8 mmol/L (ref 3.5–5.1)
Sodium: 144 mmol/L (ref 135–145)

## 2017-06-09 SURGERY — EXCISION, MASS, RECTUM, ANAL APPROACH
Anesthesia: Monitor Anesthesia Care | Site: Rectum

## 2017-06-09 MED ORDER — BUPIVACAINE-EPINEPHRINE 0.5% -1:200000 IJ SOLN
INTRAMUSCULAR | Status: DC | PRN
  Administered 2017-06-09: 30 mL

## 2017-06-09 MED ORDER — SODIUM CHLORIDE 0.9% FLUSH
3.0000 mL | Freq: Two times a day (BID) | INTRAVENOUS | Status: DC
  Filled 2017-06-09: qty 3

## 2017-06-09 MED ORDER — SODIUM CHLORIDE 0.9 % IV SOLN
250.0000 mL | INTRAVENOUS | Status: DC | PRN
  Filled 2017-06-09: qty 250

## 2017-06-09 MED ORDER — DEXAMETHASONE SODIUM PHOSPHATE 10 MG/ML IJ SOLN
INTRAMUSCULAR | Status: AC
  Filled 2017-06-09: qty 1

## 2017-06-09 MED ORDER — OXYCODONE HCL 5 MG PO TABS
5.0000 mg | ORAL_TABLET | ORAL | Status: DC | PRN
  Filled 2017-06-09: qty 2

## 2017-06-09 MED ORDER — MIDAZOLAM HCL 5 MG/5ML IJ SOLN
INTRAMUSCULAR | Status: DC | PRN
  Administered 2017-06-09: 2 mg via INTRAVENOUS

## 2017-06-09 MED ORDER — FENTANYL CITRATE (PF) 250 MCG/5ML IJ SOLN
INTRAMUSCULAR | Status: DC | PRN
  Administered 2017-06-09: 25 ug via INTRAVENOUS

## 2017-06-09 MED ORDER — SODIUM CHLORIDE 0.9% FLUSH
3.0000 mL | INTRAVENOUS | Status: DC | PRN
  Filled 2017-06-09: qty 3

## 2017-06-09 MED ORDER — MIDAZOLAM HCL 2 MG/2ML IJ SOLN
INTRAMUSCULAR | Status: AC
  Filled 2017-06-09: qty 2

## 2017-06-09 MED ORDER — LIDOCAINE 5 % EX OINT
TOPICAL_OINTMENT | CUTANEOUS | Status: DC | PRN
  Administered 2017-06-09: 1

## 2017-06-09 MED ORDER — PROPOFOL 500 MG/50ML IV EMUL
INTRAVENOUS | Status: DC | PRN
  Administered 2017-06-09: 200 ug/kg/min via INTRAVENOUS

## 2017-06-09 MED ORDER — LACTATED RINGERS IV SOLN
INTRAVENOUS | Status: DC
  Administered 2017-06-09: 13:00:00 via INTRAVENOUS
  Filled 2017-06-09: qty 1000

## 2017-06-09 MED ORDER — FENTANYL CITRATE (PF) 100 MCG/2ML IJ SOLN
INTRAMUSCULAR | Status: AC
  Filled 2017-06-09: qty 2

## 2017-06-09 MED ORDER — FENTANYL CITRATE (PF) 100 MCG/2ML IJ SOLN
25.0000 ug | INTRAMUSCULAR | Status: DC | PRN
  Filled 2017-06-09: qty 1

## 2017-06-09 MED ORDER — ACETAMINOPHEN 650 MG RE SUPP
650.0000 mg | RECTAL | Status: DC | PRN
  Filled 2017-06-09: qty 1

## 2017-06-09 MED ORDER — LIDOCAINE 2% (20 MG/ML) 5 ML SYRINGE
INTRAMUSCULAR | Status: DC | PRN
  Administered 2017-06-09: 50 mg via INTRAVENOUS

## 2017-06-09 MED ORDER — PROMETHAZINE HCL 25 MG/ML IJ SOLN
6.2500 mg | INTRAMUSCULAR | Status: DC | PRN
  Filled 2017-06-09: qty 1

## 2017-06-09 MED ORDER — LACTATED RINGERS IV SOLN
INTRAVENOUS | Status: DC | PRN
  Administered 2017-06-09: 12:00:00 via INTRAVENOUS

## 2017-06-09 MED ORDER — PROPOFOL 500 MG/50ML IV EMUL
INTRAVENOUS | Status: AC
  Filled 2017-06-09: qty 50

## 2017-06-09 MED ORDER — BUPIVACAINE LIPOSOME 1.3 % IJ SUSP
INTRAMUSCULAR | Status: DC | PRN
  Administered 2017-06-09: 20 mL

## 2017-06-09 MED ORDER — ACETAMINOPHEN 325 MG PO TABS
650.0000 mg | ORAL_TABLET | ORAL | Status: DC | PRN
  Filled 2017-06-09: qty 2

## 2017-06-09 MED ORDER — OXYCODONE HCL 5 MG PO TABS: 5.0000 mg | ORAL_TABLET | Freq: Four times a day (QID) | ORAL | 0 refills | Status: DC | PRN

## 2017-06-09 SURGICAL SUPPLY — 47 items
BENZOIN TINCTURE PRP APPL 2/3 (GAUZE/BANDAGES/DRESSINGS) ×2 IMPLANT
BLADE HEX COATED 2.75 (ELECTRODE) ×2 IMPLANT
BLADE SURG 10 STRL SS (BLADE) ×2 IMPLANT
BRIEF STRETCH FOR OB PAD LRG (UNDERPADS AND DIAPERS) ×4 IMPLANT
CANISTER SUCT 3000ML PPV (MISCELLANEOUS) ×2 IMPLANT
COVER BACK TABLE 60X90IN (DRAPES) ×2 IMPLANT
COVER MAYO STAND STRL (DRAPES) ×2 IMPLANT
DECANTER SPIKE VIAL GLASS SM (MISCELLANEOUS) ×2 IMPLANT
DRAPE LAPAROTOMY 100X72 PEDS (DRAPES) ×2 IMPLANT
DRAPE LG THREE QUARTER DISP (DRAPES) IMPLANT
DRAPE UNDERBUTTOCKS STRL (DRAPE) IMPLANT
DRAPE UTILITY XL STRL (DRAPES) ×2 IMPLANT
ELECT BLADE 6.5 .24CM SHAFT (ELECTRODE) IMPLANT
ELECT REM PT RETURN 9FT ADLT (ELECTROSURGICAL) ×2
ELECTRODE REM PT RTRN 9FT ADLT (ELECTROSURGICAL) ×1 IMPLANT
GAUZE SPONGE 4X4 12PLY STRL LF (GAUZE/BANDAGES/DRESSINGS) ×2 IMPLANT
GAUZE SPONGE 4X4 16PLY XRAY LF (GAUZE/BANDAGES/DRESSINGS) IMPLANT
GAUZE VASELINE 3X9 (GAUZE/BANDAGES/DRESSINGS) IMPLANT
GLOVE BIO SURGEON STRL SZ 6.5 (GLOVE) ×4 IMPLANT
GOWN STRL REUS W/ TWL LRG LVL3 (GOWN DISPOSABLE) ×1 IMPLANT
GOWN STRL REUS W/ TWL XL LVL3 (GOWN DISPOSABLE) ×2 IMPLANT
GOWN STRL REUS W/TWL LRG LVL3 (GOWN DISPOSABLE) ×1
GOWN STRL REUS W/TWL XL LVL3 (GOWN DISPOSABLE) ×2
KIT RM TURNOVER CYSTO AR (KITS) ×2 IMPLANT
LEGGING LITHOTOMY PAIR STRL (DRAPES) IMPLANT
MANIFOLD NEPTUNE II (INSTRUMENTS) IMPLANT
NDL SAFETY ECLIPSE 18X1.5 (NEEDLE) IMPLANT
NEEDLE HYPO 18GX1.5 SHARP (NEEDLE)
NEEDLE HYPO 22GX1.5 SAFETY (NEEDLE) ×2 IMPLANT
NS IRRIG 500ML POUR BTL (IV SOLUTION) ×2 IMPLANT
PACK BASIN DAY SURGERY FS (CUSTOM PROCEDURE TRAY) ×2 IMPLANT
PAD ABD 8X10 STRL (GAUZE/BANDAGES/DRESSINGS) ×2 IMPLANT
PAD ARMBOARD 7.5X6 YLW CONV (MISCELLANEOUS) IMPLANT
PENCIL BUTTON HOLSTER BLD 10FT (ELECTRODE) ×2 IMPLANT
SPONGE GAUZE 4X4 12PLY (GAUZE/BANDAGES/DRESSINGS) IMPLANT
SPONGE SURGIFOAM ABS GEL 12-7 (HEMOSTASIS) IMPLANT
SUT CHROMIC 2 0 SH (SUTURE) ×2 IMPLANT
SUT CHROMIC 3 0 SH 27 (SUTURE) IMPLANT
SUT MON AB 3-0 SH 27 (SUTURE) ×1
SUT MON AB 3-0 SH27 (SUTURE) ×1 IMPLANT
SUT VIC AB 4-0 P-3 18XBRD (SUTURE) IMPLANT
SUT VIC AB 4-0 P3 18 (SUTURE)
SYR CONTROL 10ML LL (SYRINGE) ×2 IMPLANT
TOWEL NATURAL 6PK STERILE (DISPOSABLE) ×2 IMPLANT
TRAY DSU PREP LF (CUSTOM PROCEDURE TRAY) ×2 IMPLANT
TUBE CONNECTING 12X1/4 (SUCTIONS) ×2 IMPLANT
YANKAUER SUCT BULB TIP NO VENT (SUCTIONS) ×2 IMPLANT

## 2017-06-09 NOTE — Op Note (Signed)
06/09/2017  1:46 PM  PATIENT:  Alexa [name]  66 y.o. female  Patient Care Team: Lanice Shirts, MD as PCP - General (Internal Medicine)  PRE-OPERATIVE DIAGNOSIS:  rectal carcinoma  POST-OPERATIVE DIAGNOSIS:  rectal carcinoma  PROCEDURE:  Procedure(s): TRANSANAL EXCISION OF RECTAL POLYPECTOMY SITE   Surgeon(s): [name], MD  ASSISTANT: none   ANESTHESIA:   local and MAC  SPECIMEN:  Source of Specimen:  distal anterior rectal polypectomy site  DISPOSITION OF SPECIMEN:  PATHOLOGY  COUNTS:  YES  PLAN OF CARE: Discharge to home after PACU  PATIENT DISPOSITION:  PACU - hemodynamically stable.  INDICATION: 65 year old female who presents to the office for evaluation of a polypectomy that had rectal cancer noted within the specimen. She is here today for a more definitive resection for staging.   OR FINDINGS: Polypectomy scar at anterior midline 3-4 mm proximal to the dentate line.  DESCRIPTION: the patient was identified in the preoperative holding area and taken to the OR where they were laid on the operating room table.  MAC anesthesia was induced without difficulty. The patient was then positioned in prone jackknife position with buttocks gently taped apart.  The patient was then prepped and draped in usual sterile fashion.  SCDs were noted to be in place prior to the initiation of anesthesia. A surgical timeout was performed indicating the correct patient, procedure, positioning and need for preoperative antibiotics.  A rectal block was performed using Marcaine with epinephrine and Exparel.    I began with a digital rectal exam.  No masses were palpated.  I then placed a Hill-Ferguson anoscope into the anal canal and evaluated this completely.  I identified the scar in the anterior distal rectum. I marked out approximately 5 mm border around the scar with electrocautery. I then divided distally into the anal mucosa using electrocautery. I then used Metzenbaum  scissors to dissect the mucosa away from the underlying sphincter complex. I divided the remaining mucosa using Metzenbaum scissors and the previously marked edges. This was then sent to pathology on a corkboard with right and left lateral margins marked as well as distal and proximal margins. The mucosa was then reapproximated to the dentate line using interrupted 20 and 3-0 chromic sutures. The patient tolerated the procedure well and sent to the postanesthesia care unit in stable condition. All counts were correct per operating room staff.

## 2017-06-09 NOTE — H&P (View-Only) (Signed)
History of Present Illness [name] MD; 1/69/6789 10:20 AM) The patient is a 66 year old female who presents with colorectal cancer. 66 year old female who presents to the office for evaluation of a newly diagnosed rectal carcinoma. She underwent a screening colonoscopy with Dr. Collene Mares. An 8 mm distal rectal polyp was removed via hot snare resection. Biopsies show rectal carcinoma with IHC staining more consistent with adenocarcinoma. Margins were involved. She has underwent CT scans which show no signs of metastatic disease. She is currently asymptomatic and denies any pain or rectal bleeding or weight loss.   Past Surgical History Malachy Moan, [place]; 05/23/2017 9:42 AM) Colon Polyp Removal - Colonoscopy Foot Surgery Right. Oral Surgery  Diagnostic Studies History Malachy Moan, [place]; 05/23/2017 9:42 AM) Colonoscopy within last year Mammogram within last year Pap Smear 1-5 years ago  Allergies Malachy Moan, RMA; 05/23/2017 9:44 AM) Ebbie Ridge *NASAL AGENTS - SYSTEMIC AND TOPICAL* palp  Medication History Malachy Moan, RMA; 05/23/2017 9:45 AM) AmLODIPine Besylate (5MG  Tablet, Oral) Active. Clobetasol Propionate (0.05% Cream, External) Active. Rosuvastatin Calcium (10MG  Tablet, Oral) Active. Vitamin D (Cholecalciferol) (Oral) Specific strength unknown - Active. Lactobacillus (Oral) Specific strength unknown - Active. Omeprazole (40MG  Capsule ER, Oral) Active. Medications Reconciled  Social History Malachy Moan, [place]; 05/23/2017 9:42 AM) Alcohol use Occasional alcohol use. Caffeine use Coffee. No drug use Tobacco use Never smoker.  Family History Malachy Moan, [place]; 05/23/2017 9:42 AM) Alcohol Abuse Father. Anesthetic complications Sister. Arthritis Mother. Cancer Mother. Diabetes Mellitus Father. Heart Disease Brother, Father, Mother. Heart disease in female family member before age 53 Heart disease in female family member  before age 64 Hypertension Brother, Father, Mother. Melanoma Brother. Prostate Cancer Father.  Pregnancy / Birth History Malachy Moan, [place]; 05/23/2017 9:43 AM) Age at menarche 47 years. Age of menopause 51-55 Contraceptive History Oral contraceptives. Gravida 0 Para 0  Other Problems Malachy Moan, [place]; 05/23/2017 9:42 AM) Anxiety Disorder Gastroesophageal Reflux Disease Hemorrhoids Hepatitis High blood pressure Hypercholesterolemia Melanoma Other disease, cancer, significant illness     Review of Systems Malachy Moan RMA; 05/23/2017 9:43 AM) HEENT Present- Hoarseness, Seasonal Allergies and Wears glasses/contact lenses. Not Present- Earache, Hearing Loss, Nose Bleed, Oral Ulcers, Ringing in the Ears, Sinus Pain, Sore Throat, Visual Disturbances and Yellow Eyes. Respiratory Not Present- Bloody sputum, Chronic Cough, Difficulty Breathing, Snoring and Wheezing. Breast Not Present- Breast Mass, Breast Pain, Nipple Discharge and Skin Changes. Cardiovascular Present- Palpitations. Not Present- Chest Pain, Difficulty Breathing Lying Down, Leg Cramps, Rapid Heart Rate, Shortness of Breath and Swelling of Extremities. Gastrointestinal Present- Hemorrhoids and Indigestion. Not Present- Abdominal Pain, Bloating, Bloody Stool, Change in Bowel Habits, Chronic diarrhea, Constipation, Difficulty Swallowing, Excessive gas, Gets full quickly at meals, Nausea, Rectal Pain and Vomiting. Female Genitourinary Not Present- Frequency, Nocturia, Painful Urination, Pelvic Pain and Urgency. Musculoskeletal Not Present- Back Pain, Joint Pain, Joint Stiffness, Muscle Pain, Muscle Weakness and Swelling of Extremities. Neurological Not Present- Decreased Memory, Fainting, Headaches, Numbness, Seizures, Tingling, Tremor, Trouble walking and Weakness. Psychiatric Present- Anxiety. Not Present- Bipolar, Change in Sleep Pattern, Depression, Fearful and Frequent crying. Endocrine Not  Present- Cold Intolerance, Excessive Hunger, Hair Changes, Heat Intolerance, Hot flashes and New Diabetes. Hematology Not Present- Blood Thinners, Easy Bruising, Excessive bleeding, Gland problems, HIV and Persistent Infections.  Vitals Malachy Moan RMA; 05/23/2017 9:45 AM) 05/23/2017 9:45 AM Weight: 146.2 lb Height: 63.5in Body Surface Area: 1.7 m Body Mass Index: 25.49 kg/m  Temp.: 98.78F  Pulse: 60 (Regular)  BP: 156/90 (Sitting, Left Arm, Standard)  Physical Exam [name] MD; 2/62/0355 10:21 AM)  General Mental Status-Alert. General Appearance-Not in acute distress. Build & Nutrition-Well nourished. Posture-Normal posture. Gait-Normal.  Head and Neck Head-normocephalic, atraumatic with no lesions or palpable masses. Trachea-midline.  Chest and Lung Exam Chest and lung exam reveals -on auscultation, normal breath sounds, no adventitious sounds and normal vocal resonance.  Cardiovascular Cardiovascular examination reveals -normal heart sounds, regular rate and rhythm with no murmurs and no digital clubbing, cyanosis, edema, increased warmth or tenderness.  Abdomen Inspection Inspection of the abdomen reveals - No Hernias. Palpation/Percussion Palpation and Percussion of the abdomen reveal - Soft, Non Tender, No Rigidity (guarding), No hepatosplenomegaly and No Palpable abdominal masses.  Rectal Anorectal Exam External - normal external exam. Internal - normal internal exam.  Neurologic Neurologic evaluation reveals -alert and oriented x 3 with no impairment of recent or remote memory, normal attention span and ability to concentrate, normal sensation and normal coordination.  Musculoskeletal Normal Exam - Bilateral-Upper Extremity Strength Normal and Lower Extremity Strength Normal.   Results [name] MD; 9/74/1638 10:19 AM) Procedures  Name Value Date ANOSCOPY, DIAGNOSTIC (45364) [ Hemorrhoids  ] Procedure Other: Procedure: Anoscopy Surgeon: Marcello Moores After the risks and benefits were explained, verbal consent was obtained for above procedure. A medical assistant chaperone was present thoroughout the entire procedure. Anesthesia: none Diagnosis: rectal carcinoma Findings: There is a ulceration noted approximately 3-4 mm from the dentate line just to the right of the anterior midline that is presumably the site of endoscopic resection.  Performed: 05/23/2017 10:17 AM    Assessment & Plan [name] MD; 6/80/3212 10:23 AM)  RECTAL CARCINOMA (C20) Impression: 66 year old female with a history of distal rectal polyp resection during colonoscopy approximately 3 weeks ago. This was noted to be a poorly differentiated carcinoma on pathology. Immunohistochemistry staining shows signs more consistent with adenocarcinoma. On exam today the patient has an ulcer at the anterior midline approximately 3-4 mm from the dentate line. This appears to be the source of polypectomy. CT scan showed no signs of metastatic disease. I have recommended a transanal excision of the surgical scar for a more adequate biopsy and clear margins. We will then review the specimen to determine her staging. She will also need a rectal MRI and CEA level at some point in the future.

## 2017-06-09 NOTE — Anesthesia Postprocedure Evaluation (Signed)
Anesthesia Post Note  Patient: Alexa [name]) Performed: Procedure(s) (LRB): TRANSANAL EXCISION OF RECTAL MASS (N/A)     Patient location during evaluation: PACU Anesthesia Type: MAC Level of consciousness: awake and alert Pain management: pain level controlled Vital Signs Assessment: post-procedure vital signs reviewed and stable Respiratory status: spontaneous breathing, nonlabored ventilation, respiratory function stable and patient connected to nasal cannula oxygen Cardiovascular status: stable and blood pressure returned to baseline Anesthetic complications: no    Last Vitals:  Vitals:   06/09/17 1430 06/09/17 1517  BP: (!) 134/50 (!) 143/69  Pulse: 66 75  Resp: 10 16  Temp:  (!) 36.4 C    Last Pain:  Vitals:   06/09/17 1205  TempSrc: Oral                 Kaleya Douse S

## 2017-06-09 NOTE — Anesthesia Preprocedure Evaluation (Addendum)
Anesthesia Evaluation  Patient identified by MRN, date of birth, ID band Patient awake    Reviewed: Allergy & Precautions, NPO status , Patient's Chart, lab work & pertinent test results  History of Anesthesia Complications (+) Family history of anesthesia reaction and history of anesthetic complications  Airway Mallampati: II  TM Distance: >3 FB Neck ROM: full    Dental no notable dental hx.    Pulmonary neg pulmonary ROS,    Pulmonary exam normal breath sounds clear to auscultation       Cardiovascular hypertension, Normal cardiovascular exam Rhythm:regular Rate:Normal     Neuro/Psych  Neuromuscular disease negative neurological ROS  negative psych ROS   GI/Hepatic Neg liver ROS, GERD  ,  Endo/Other  negative endocrine ROS  Renal/GU negative Renal ROS  negative genitourinary   Musculoskeletal negative musculoskeletal ROS (+)   Abdominal   Peds negative pediatric ROS (+)  Hematology negative hematology ROS (+)   Anesthesia Other Findings Patient tested negative for MH.  Her sister and her sister's children are positive for MH.  Reproductive/Obstetrics negative OB ROS                            Anesthesia Physical Anesthesia Plan  ASA: II  Anesthesia Plan: MAC   Post-op Pain Management:    Induction: Intravenous  PONV Risk Score and Plan: 2 and Ondansetron, Dexamethasone, Propofol infusion and Treatment may vary due to age or medical condition  Airway Management Planned: Simple Face Mask  Additional Equipment:   Intra-op Plan:   Post-operative Plan:   Informed Consent: I have reviewed the patients History and Physical, chart, labs and discussed the procedure including the risks, benefits and alternatives for the proposed anesthesia with the patient or authorized representative who has indicated his/her understanding and acceptance.     Plan Discussed with: CRNA,  Anesthesiologist and Surgeon  Anesthesia Plan Comments:         Anesthesia Quick Evaluation

## 2017-06-09 NOTE — Anesthesia Procedure Notes (Signed)
Procedure Name: MAC Date/Time: 06/09/2017 1:11 PM Performed by: Wanita Chamberlain Pre-anesthesia Checklist: Patient identified, Timeout performed, Emergency Drugs available, Suction available and Patient being monitored Patient Re-evaluated:Patient Re-evaluated prior to induction Oxygen Delivery Method: Nasal cannula Preoxygenation: Pre-oxygenation with 100% oxygen Induction Type: IV induction

## 2017-06-09 NOTE — Interval H&P Note (Signed)
History and Physical Interval Note:  06/09/2017 12:54 PM  Alexa [name]  has presented today for surgery, with the diagnosis of rectal carcinoma  The various methods of treatment have been discussed with the patient and family. After consideration of risks, benefits and other options for treatment, the patient has consented to  Procedure(s): TRANSANAL EXCISION OF RECTAL MASS (N/A) as a surgical intervention .  The patient's history has been reviewed, patient examined, no change in status, stable for surgery.  I have reviewed the patient's chart and labs.  Questions were answered to the patient's satisfaction.     Rosario Adie, MD  [facility]

## 2017-06-09 NOTE — Transfer of Care (Signed)
Immediate Anesthesia Transfer of Care Note  Patient: Alexa [name]) Performed: Procedure(s): TRANSANAL EXCISION OF RECTAL MASS (N/A)  Patient Location: PACU  Anesthesia Type:MAC  Level of Consciousness: awake, alert  and oriented  Airway & Oxygen Therapy: Patient Spontanous Breathing and Patient connected to nasal cannula oxygen  Post-op Assessment: Report given to RN  Post vital signs: Reviewed and stable  Last Vitals: 86, 16, 100%, 97.7 Vitals:   06/09/17 1205 06/09/17 1357  BP: 139/62 (P) 118/64  Pulse: 62   Resp: 18   Temp: 36.8 C     Last Pain:  Vitals:   06/09/17 1205  TempSrc: Oral      Patients Stated Pain Goal: 5 (24/26/83 4196)  Complications: No apparent anesthesia complications

## 2017-06-09 NOTE — Discharge Instructions (Addendum)
ANORECTAL SURGERY: POST OP INSTRUCTIONS °1. Take your usually prescribed home medications unless otherwise directed. °2. DIET: During the first few hours after surgery sip on some liquids until you are able to urinate.  It is normal to not urinate for several hours after this surgery.  If you feel uncomfortable, please contact the office for instructions.  After you are able to urinate,you may eat, if you feel like it.  Follow a light bland diet the first 24 hours after arrival home, such as soup, liquids, crackers, etc.  Be sure to include lots of fluids daily (6-8 glasses).  Avoid fast food or heavy meals, as your are more likely to get nauseated.  Eat a low fat diet the next few days after surgery.  Limit caffeine intake to 1-2 servings a day. °3. PAIN CONTROL: °a. Pain is best controlled by a usual combination of several different methods TOGETHER: °i. Muscle relaxation °1.  Soak in a warm bath (or Sitz bath) three times a day and after bowel movements.  Continue to do this until all pain is resolved. °ii. Over the counter pain medication °iii. Prescription pain medication °b. Most patients will experience some swelling and discomfort in the anus/rectal area and incisions.  Heat such as warm towels, sitz baths, warm baths, etc to help relax tight/sore spots and speed recovery.  Some people prefer to use ice, especially in the first couple days after surgery, as it may decrease the pain and swelling, or alternate between ice & heat.  Experiment to what works for you.  Swelling and bruising can take several weeks to resolve.  Pain can take even longer to completely resolve. °c. It is helpful to take an over-the-counter pain medication regularly for the first few weeks.  Choose one of the following that works best for you: °i. Naproxen (Aleve, etc)  Two 220mg tabs twice a day °ii. Ibuprofen (Advil, etc) Three 200mg tabs four times a day (every meal & bedtime) °d. A  prescription for pain medication (such as  percocet, oxycodone, hydrocodone, etc) should be given to you upon discharge.  Take your pain medication as prescribed.  °i. If you are having problems/concerns with the prescription medicine (does not control pain, nausea, vomiting, rash, itching, etc), please call us [phone] to see if we need to switch you to a different pain medicine that will work better for you and/or control your side effect better. °ii. If you need a refill on your pain medication, please contact your pharmacy.  They will contact our office to request authorization. Prescriptions will not be filled after 5 pm or on week-ends. °4. KEEP YOUR BOWELS REGULAR and AVOID CONSTIPATION °a. The goal is one to two soft bowel movements a day.  You should at least have a bowel movement every other day. °b. Avoid getting constipated.  Between the surgery and the pain medications, it is common to experience some constipation. This can be very painful after rectal surgery.  Increasing fluid intake and taking a fiber supplement (such as Metamucil, Citrucel, FiberCon, etc) 1-2 times a day regularly will usually help prevent this problem from occurring.  A stool softener like colace is also recommended.  This can be purchased over the counter at your pharmacy.  You can take it up to 3 times a day.  If you do not have a bowel movement after 24 hrs since your surgery, take one does of milk of magnesia.  If you still haven't had a bowel movement 8-12   hours after that dose, take another dose.  If you don't have a bowel movement 48 hrs after surgery, purchase a Fleets enema from the drug store and administer gently per package instructions.  If you still are having trouble with your bowel movements after that, please call the office for further instructions. °c. If you develop diarrhea or have many loose bowel movements, simplify your diet to bland foods & liquids for a few days.  Stop any stool softeners and decrease your fiber supplement.  Switching to mild  anti-diarrheal medications (Kayopectate, Pepto Bismol) can help.  If this worsens or does not improve, please call us. ° °5. Wound Care °a. Remove your bandages before your first bowel movement or 8 hours after surgery.     °b. Remove any wound packing material at this tim,e as well.  You do not need to repack the wound unless instructed otherwise.  Wear an absorbent pad or soft cotton gauze in your underwear to catch any drainage and help keep the area clean. You should change this every 2-3 hours while awake. °c. Keep the area clean and dry.  Bathe / shower every day, especially after bowel movements.  Keep the area clean by showering / bathing over the incision / wound.   It is okay to soak an open wound to help wash it.  Wet wipes or showers / gentle washing after bowel movements is often less traumatic than regular toilet paper. °d. You may have some styrofoam-like soft packing in the rectum which will come out with the first bowel movement.  °e. You will often notice bleeding with bowel movements.  This should slow down by the end of the first week of surgery °f. Expect some drainage.  This should slow down, too, by the end of the first week of surgery.  Wear an absorbent pad or soft cotton gauze in your underwear until the drainage stops. °g. Do Not sit on a rubber or pillow ring.  This can make you symptoms worse.  You may sit on a soft pillow if needed.  °6. ACTIVITIES as tolerated:   °a. You may resume regular (light) daily activities beginning the next day--such as daily self-care, walking, climbing stairs--gradually increasing activities as tolerated.  If you can walk 30 minutes without difficulty, it is safe to try more intense activity such as jogging, treadmill, bicycling, low-impact aerobics, swimming, etc. °b. Save the most intensive and strenuous activity for last such as sit-ups, heavy lifting, contact sports, etc  Refrain from any heavy lifting or straining until you are off narcotics for pain  control.   °c. You may drive when you are no longer taking prescription pain medication, you can comfortably sit for long periods of time, and you can safely maneuver your car and apply brakes. °d. You may have sexual intercourse when it is comfortable.  °7. FOLLOW UP in our office °a. Please call [facility] at [phone] to set up an appointment to see your surgeon in the office for a follow-up appointment approximately 3-4 weeks after your surgery. °b. Make sure that you call for this appointment the day you arrive home to insure a convenient appointment time. °10. IF YOU HAVE DISABILITY OR FAMILY LEAVE FORMS, BRING THEM TO THE OFFICE FOR PROCESSING.  DO NOT GIVE THEM TO YOUR DOCTOR. ° ° ° ° °WHEN TO CALL US [phone]: °1. Poor pain control °2. Reactions / problems with new medications (rash/itching, nausea, etc)  °3. Fever over 101.5 F (38.5   C) 4. Inability to urinate 5. Nausea and/or vomiting 6. Worsening swelling or bruising 7. Continued bleeding from incision. 8. Increased pain, redness, or drainage from the incision  The clinic staff is available to answer your questions during regular business hours (8:30am-5pm).  Please dont hesitate to call and ask to speak to one of our nurses for clinical concerns.   A surgeon from [facility] is always on call at the hospitals   If you have a medical emergency, go to the nearest emergency room or call 911.    [facility], [place], [place], [place], [place]  [address] ? MAIN: [phone] ? TOLL FREE: [phone] ? FAX [phone] [email or web address]   Post Anesthesia Home Care Instructions  Activity: Get plenty of rest for the remainder of the day. A responsible individual must stay with you for 24 hours following the procedure.  For the next 24 hours, DO NOT: -Drive a car -Paediatric nurse -Drink alcoholic beverages -Take any medication unless instructed by your physician -Make  any legal decisions or sign important papers.  Meals: Start with liquid foods such as gelatin or soup. Progress to regular foods as tolerated. Avoid greasy, spicy, heavy foods. If nausea and/or vomiting occur, drink only clear liquids until the nausea and/or vomiting subsides. Call your physician if vomiting continues.  Special Instructions/Symptoms: Your throat may feel dry or sore from the anesthesia or the breathing tube placed in your throat during surgery. If this causes discomfort, gargle with warm salt water. The discomfort should disappear within 24 hours.  If you had a scopolamine patch placed behind your ear for the management of post- operative nausea and/or vomiting:  1. The medication in the patch is effective for 72 hours, after which it should be removed.  Wrap patch in a tissue and discard in the trash. Wash hands thoroughly with soap and water. 2. You may remove the patch earlier than 72 hours if you experience unpleasant side effects which may include dry mouth, dizziness or visual disturbances. 3. Avoid touching the patch. Wash your hands with soap and water after contact with the patch.   Information for Discharge Teaching: EXPAREL (bupivacaine liposome injectable suspension)   Your surgeon gave you EXPAREL(bupivacaine) in your surgical incision to help control your pain after surgery.   EXPAREL is a local anesthetic that provides pain relief by numbing the tissue around the surgical site.  EXPAREL is designed to release pain medication over time and can control pain for up to 72 hours.  Depending on how you respond to EXPAREL, you may require less pain medication during your recovery.  Possible side effects:  Temporary loss of sensation or ability to move in the area where bupivacaine was injected.  Nausea, vomiting, constipation  Rarely, numbness and tingling in your mouth or lips, lightheadedness, or anxiety may occur.  Call your doctor right away if you think  you may be experiencing any of these sensations, or if you have other questions regarding possible side effects.  Follow all other discharge instructions given to you by your surgeon or nurse. Eat a healthy diet and drink plenty of water or other fluids.  If you return to the hospital for any reason within 96 hours following the administration of EXPAREL, please inform your health care providers.

## 2017-06-09 NOTE — Anesthesia Preprocedure Evaluation (Addendum)
Anesthesia Evaluation  Patient identified by MRN, date of birth, ID band Patient awake    Reviewed: Allergy & Precautions, NPO status , Patient's Chart, lab work & pertinent test results  History of Anesthesia Complications (+) Family history of anesthesia reaction  Airway Mallampati: II  TM Distance: >3 FB Neck ROM: Full    Dental no notable dental hx.    Pulmonary neg pulmonary ROS,    Pulmonary exam normal breath sounds clear to auscultation       Cardiovascular hypertension, Pt. on medications Normal cardiovascular exam Rhythm:Regular Rate:Normal     Neuro/Psych negative neurological ROS  negative psych ROS   GI/Hepatic Neg liver ROS, GERD  Controlled,  Endo/Other  negative endocrine ROS  Renal/GU negative Renal ROS  negative genitourinary   Musculoskeletal negative musculoskeletal ROS (+)   Abdominal   Peds negative pediatric ROS (+)  Hematology negative hematology ROS (+)   Anesthesia Other Findings Pt tested negative for MH. Sister and her offspring tested pos. fo MH.  Reproductive/Obstetrics negative OB ROS                            Anesthesia Physical Anesthesia Plan  ASA: II  Anesthesia Plan: MAC   Post-op Pain Management:    Induction: Intravenous  PONV Risk Score and Plan: 1 and Ondansetron and Dexamethasone  Airway Management Planned: Simple Face Mask  Additional Equipment:   Intra-op Plan:   Post-operative Plan:   Informed Consent: I have reviewed the patients History and Physical, chart, labs and discussed the procedure including the risks, benefits and alternatives for the proposed anesthesia with the patient or authorized representative who has indicated his/her understanding and acceptance.   Dental advisory given  Plan Discussed with: CRNA and Surgeon  Anesthesia Plan Comments:         Anesthesia Quick Evaluation

## 2017-06-12 ENCOUNTER — Encounter (HOSPITAL_BASED_OUTPATIENT_CLINIC_OR_DEPARTMENT_OTHER): Payer: Self-pay | Admitting: General Surgery

## 2017-06-14 ENCOUNTER — Other Ambulatory Visit: Payer: Self-pay | Admitting: General Surgery

## 2017-06-14 DIAGNOSIS — C20 Malignant neoplasm of rectum: Secondary | ICD-10-CM

## 2017-06-27 ENCOUNTER — Ambulatory Visit
Admission: RE | Admit: 2017-06-27 | Discharge: 2017-06-27 | Disposition: A | Discharge status: Home / Self Care | Payer: Managed Care, Other (non HMO) | Source: Ambulatory Visit | Attending: General Surgery | Admitting: General Surgery

## 2017-06-27 DIAGNOSIS — C20 Malignant neoplasm of rectum: Secondary | ICD-10-CM

## 2017-06-27 MED ORDER — GADOBENATE DIMEGLUMINE 529 MG/ML IV SOLN
15.0000 mL | Freq: Once | INTRAVENOUS | Status: AC | PRN
  Administered 2017-06-27: 13 mL via INTRAVENOUS

## 2017-08-04 ENCOUNTER — Other Ambulatory Visit: Payer: Self-pay | Admitting: Obstetrics & Gynecology

## 2017-08-04 DIAGNOSIS — Z1231 Encounter for screening mammogram for malignant neoplasm of breast: Secondary | ICD-10-CM

## 2017-08-21 ENCOUNTER — Ambulatory Visit: Payer: Managed Care, Other (non HMO)

## 2017-09-05 ENCOUNTER — Ambulatory Visit: Payer: Managed Care, Other (non HMO) | Admitting: Skilled Nursing Facility1

## 2017-09-14 ENCOUNTER — Ambulatory Visit
Admission: RE | Admit: 2017-09-14 | Discharge: 2017-09-14 | Disposition: A | Discharge status: Home / Self Care | Payer: Managed Care, Other (non HMO) | Source: Ambulatory Visit | Attending: Obstetrics & Gynecology | Admitting: Obstetrics & Gynecology

## 2017-09-14 DIAGNOSIS — M8580 Other specified disorders of bone density and structure, unspecified site: Secondary | ICD-10-CM

## 2017-09-14 DIAGNOSIS — Z1231 Encounter for screening mammogram for malignant neoplasm of breast: Secondary | ICD-10-CM

## 2017-09-15 ENCOUNTER — Other Ambulatory Visit: Payer: Self-pay | Admitting: Obstetrics & Gynecology

## 2017-09-15 ENCOUNTER — Telehealth: Payer: Self-pay | Admitting: Obstetrics & Gynecology

## 2017-09-15 DIAGNOSIS — R928 Other abnormal and inconclusive findings on diagnostic imaging of breast: Secondary | ICD-10-CM

## 2017-09-15 NOTE — Telephone Encounter (Signed)
Patient had mammogram and would like to speak with someone about the results.

## 2017-09-15 NOTE — Telephone Encounter (Signed)
Spoke with patient. Patient would like to discuss results from her screening mammogram as she has been called back to have a left diagnostic mammogram and ultrasound. Reviewed results with patient as seen below. Advised patient asymmetry in left breast needs further evaluation to be sure that there is nothing further seen or needed. Advised results will be given at the time of her additional imaging. Patient verbalizes understanding.  FINDINGS: In the left breast, a possible asymmetry warrants further evaluation. In the right breast, no findings suspicious for malignancy. Images were processed with CAD.  IMPRESSION: Further evaluation is suggested for possible asymmetry in the left breast.  RECOMMENDATION: Diagnostic mammogram and possibly ultrasound of the left breast. (Code:FI-L-49M)  Routing to provider for final review. Patient agreeable to disposition. Will close encounter.

## 2017-09-18 ENCOUNTER — Telehealth: Payer: Self-pay | Admitting: Obstetrics & Gynecology

## 2017-09-18 NOTE — Telephone Encounter (Signed)
Patient has a spot on her left breast she would like Dr Sabra Heck to check.

## 2017-09-18 NOTE — Telephone Encounter (Signed)
Patient states that she has a spot on her left breast that has been present for 1 week. Started off the size of a quarter. Red in color with a white center. Thought it was a sweat bump, but has now turned purple in color. Denies any lump underneath area. Patient was recalled from her screening mammogram and is to have a left diagnostic mammogram and ultrasound on 09/21/2017. Appointment for breast check scheduled for 09/19/2017 wt 11:30 am with Dr.Miller. Aware this is a work in appointment.  Routing to provider for final review. Patient agreeable to disposition. Will close encounter.

## 2017-09-19 ENCOUNTER — Encounter: Payer: Self-pay | Admitting: Obstetrics & Gynecology

## 2017-09-19 ENCOUNTER — Ambulatory Visit (INDEPENDENT_AMBULATORY_CARE_PROVIDER_SITE_OTHER): Payer: Managed Care, Other (non HMO) | Admitting: Obstetrics & Gynecology

## 2017-09-19 VITALS — BP 130/68 | HR 72 | Resp 14 | Ht 62.5 in | Wt 142.0 lb

## 2017-09-19 DIAGNOSIS — C19 Malignant neoplasm of rectosigmoid junction: Secondary | ICD-10-CM | POA: Insufficient documentation

## 2017-09-19 DIAGNOSIS — R234 Changes in skin texture: Secondary | ICD-10-CM

## 2017-09-19 NOTE — Progress Notes (Signed)
GYNECOLOGY  VISIT  CC:   Breast check  HPI: 66 y.o. G1P0 Single Caucasian female here for left breast check.  Noted area on the left breast that looked to her like a "sweat bump" but became more red and tender.  Had MMG 09/14/17 and was called back for possible right breast asymmetry.  Had Grade 1 colorectal cancer this year with a rectal polyp that was removed.  Just felt really anxious about the area on the left breast and wanted another opinion about it.  It is now much smaller in size, skin is minimally red and is non tender.  Denies trauma.  GYNECOLOGIC HISTORY: Patient's last menstrual period was 11/07/2002. Contraception: PMP  Menopausal hormone therapy: none  Patient Active Problem List   Diagnosis Date Noted  . Sciatic nerve pain 07/28/2016  . Achilles tendinitis of right lower extremity 06/10/2015  . Dysphagia 05/09/2012  . Atypical chest pain 09/26/2011  . Palpitations 09/26/2011    Past Medical History:  Diagnosis Date  . Adenomatous polyp   . 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 Family history of anesthesia complication    sister and sister's children postitive for [place]!!!!!!, pt tested negative 3-41yrs ago  . GERD (gastroesophageal reflux disease)   . Hyperlipemia   . Hypertension    off medication  . Osteopenia    bilateral hips    Past Surgical History:  Procedure Laterality Date  . COLONOSCOPY  5/13   with biopsy  . FOOT SURGERY     Right     MEDS:   Current Outpatient Medications on File Prior to Visit  Medication Sig Dispense Refill  . amLODipine (NORVASC) 5 MG tablet     . Cholecalciferol (VITAMIN D PO) Take by mouth.    . clobetasol cream (TEMOVATE) 0.05 % Apply once daily to rash    . folic acid (FOLVITE) 497 MCG tablet Take 400 mcg daily by mouth.    . Lactobacillus (PROBIOTIC ACIDOPHILUS PO) Take by mouth daily.    . methotrexate (RHEUMATREX) 2.5 MG tablet as directed.    . Omega-3 Fatty Acids (FISH OIL  PO) Take 1,000 mg by mouth every morning.    Marland Kitchen omeprazole (PRILOSEC) 40 MG capsule TAKE 1 CAPSULE BY MOUTH ONCE DAILY PRN    . rosuvastatin (CRESTOR) 10 MG tablet Take 10 mg by mouth daily.  0   No current facility-administered medications on file prior to visit.     ALLERGIES: Sudafed [pseudoephedrine hcl]  Family History  Problem Relation Age of Onset  . Anuerysm Father   . Heart disease Father   . Diabetes Father   . Cancer Father        unknown type  . Hypertension Father   . Heart attack Mother   . Heart disease Mother   . Kidney disease Mother   . Kidney cancer Mother        contained tumor  . Hypertension Mother   . Heart attack Brother   . Cancer Brother        liver cancer  . Melanoma Brother   . Other Brother        Bypass surgery   . Hypertension Sister   . Ovarian cancer Paternal Grandmother        was PMP  . Colon cancer Neg Hx     SH: Single, non smoker  Review of Systems  Constitutional: Negative.   Respiratory: Negative.   Cardiovascular: Negative.  Skin:       Area on left breast that she is here today for evaluation.    PHYSICAL EXAMINATION:    BP 130/68 (BP Location: Right Arm, Patient Position: Sitting, Cuff Size: Normal)   Pulse 72   Resp 14   Ht 5' 2.5" (1.588 m)   Wt 142 lb (64.4 kg)   LMP 11/07/2002   BMI 25.56 kg/m     Physical Exam  Constitutional: She appears well-developed and well-nourished.  Neck: Normal range of motion. Neck supple. No thyromegaly present.  Cardiovascular: Normal rate and regular rhythm.  Respiratory: Right breast exhibits no inverted nipple, no mass, no nipple discharge, no skin change and no tenderness. Left breast exhibits skin change. Left breast exhibits no inverted nipple, no mass, no nipple discharge and no tenderness. Breasts are symmetrical.    Lymphadenopathy:    She has no cervical adenopathy.    Also, reviewed BMD with pt today.  Pt is not desirous of any new medications.  Has started Vit D.   Would like to recheck in two years and continue to follow conservatively.  Assessment: Left breast skin change, not a mass.  Do not feel this needs dermatology or imaging as much improved.  Feel will resolve fully over next two to three weeks.  If does not, pt knows to call back Osteoporosis  Plan: Plan to repeat BMD 2 years.  She is going to add some foods to diet to increase calcium intake.   ~15 minutes spent with patient >50% of time was in face to face discussion of above.

## 2017-09-21 ENCOUNTER — Ambulatory Visit
Admission: RE | Admit: 2017-09-21 | Discharge: 2017-09-21 | Disposition: A | Discharge status: Home / Self Care | Payer: Managed Care, Other (non HMO) | Source: Ambulatory Visit | Attending: Obstetrics & Gynecology | Admitting: Obstetrics & Gynecology

## 2017-09-21 DIAGNOSIS — R928 Other abnormal and inconclusive findings on diagnostic imaging of breast: Secondary | ICD-10-CM

## 2018-02-06 DIAGNOSIS — I10 Essential (primary) hypertension: Secondary | ICD-10-CM | POA: Insufficient documentation

## 2018-02-06 DIAGNOSIS — E7849 Other hyperlipidemia: Secondary | ICD-10-CM | POA: Insufficient documentation

## 2018-02-12 ENCOUNTER — Ambulatory Visit: Payer: Managed Care, Other (non HMO) | Admitting: Obstetrics & Gynecology

## 2018-02-19 ENCOUNTER — Other Ambulatory Visit (HOSPITAL_COMMUNITY)
Admission: RE | Admit: 2018-02-19 | Discharge: 2018-02-19 | Disposition: A | Discharge status: Home / Self Care | Payer: Managed Care, Other (non HMO) | Source: Ambulatory Visit | Attending: Obstetrics & Gynecology | Admitting: Obstetrics & Gynecology

## 2018-02-19 ENCOUNTER — Other Ambulatory Visit: Payer: Self-pay

## 2018-02-19 ENCOUNTER — Ambulatory Visit (INDEPENDENT_AMBULATORY_CARE_PROVIDER_SITE_OTHER): Payer: Managed Care, Other (non HMO) | Admitting: Obstetrics & Gynecology

## 2018-02-19 ENCOUNTER — Encounter: Payer: Self-pay | Admitting: Obstetrics & Gynecology

## 2018-02-19 VITALS — BP 140/80 | HR 60 | Resp 16 | Ht 62.75 in | Wt 141.0 lb

## 2018-02-19 DIAGNOSIS — Z01419 Encounter for gynecological examination (general) (routine) without abnormal findings: Secondary | ICD-10-CM | POA: Diagnosis not present

## 2018-02-19 DIAGNOSIS — Z124 Encounter for screening for malignant neoplasm of cervix: Secondary | ICD-10-CM | POA: Insufficient documentation

## 2018-02-19 NOTE — Progress Notes (Signed)
67 y.o. G1P0 SingleCaucasianF here for annual exam.  Doing well.  Has colonoscopy scheduled 05/04/18.  Did have follow-up exam with Dr. Marcello Moores.  Everything was good exam.  Has noticed that she is having some mild constipation.      Patient's last menstrual period was 11/07/2002.          Sexually active: No.  The current method of family planning is post menopausal status.    Exercising: Yes.    pilates, walking, weights Smoker:  no  Health Maintenance: Pap:  01/27/17 Neg. HR HPV:neg   11/10/14 Neg  History of abnormal Pap:  yes MMG:  09/21/17 BIRADS1:Neg  Colonoscopy:  05/03/17 polyp. Rectal cancer. Dr. Collene Mares  BMD:   09/14/17 Osteoporosis.  Does not want medications.  Planning on rechecking 2 year.    TDaP:  2011 Pneumonia vaccine(s):  Never Shingrix:   Never Hep C testing: 01/19/16 neg  Screening Labs: PCP   reports that she has never smoked. She has never used smokeless tobacco. She reports that she drinks about 0.6 oz of alcohol per week. She reports that she does not use drugs.  Past Medical History:  Diagnosis Date  . Adenomatous polyp   . 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 Family history of anesthesia complication    sister and sister's children postitive for [place]!!!!!!, pt tested negative 3-29yrs ago  . GERD (gastroesophageal reflux disease)   . Hyperlipemia   . Hypertension    off medication  . Osteopenia    bilateral hips  . Rectal cancer ([place]) 04/2017    Past Surgical History:  Procedure Laterality Date  . COLONOSCOPY  5/13   with biopsy  . DILATATION & CURRETTAGE/HYSTEROSCOPY WITH RESECTOCOPE N/A 06/02/2014   Procedure: [place];  Surgeon: Lyman Speller, MD;  Location: [facility];  Service: Gynecology;  Laterality: N/A;  . FOOT SURGERY     Right   . TRANSANAL EXCISION OF RECTAL MASS N/A 06/09/2017   Procedure: TRANSANAL EXCISION OF RECTAL MASS;  Surgeon: [name], MD;   Location: [place];  Service: General;  Laterality: N/A;    Current Outpatient Medications  Medication Sig Dispense Refill  . amLODipine (NORVASC) 5 MG tablet     . Cholecalciferol (VITAMIN D3) 400 units CAPS Take 800 Units by mouth daily.    . clobetasol cream (TEMOVATE) 0.05 % Apply once daily to rash    . folic acid (FOLVITE) 188 MCG tablet Take by mouth daily.    . methotrexate (RHEUMATREX) 2.5 MG tablet as directed.    Marland Kitchen omeprazole (PRILOSEC) 40 MG capsule TAKE 1 CAPSULE BY MOUTH ONCE DAILY PRN     No current facility-administered medications for this visit.     Family History  Problem Relation Age of Onset  . Anuerysm Father   . Heart disease Father   . Diabetes Father   . Cancer Father        unknown type  . Hypertension Father   . Heart attack Mother   . Heart disease Mother   . Kidney disease Mother   . Kidney cancer Mother        contained tumor  . Hypertension Mother   . Heart attack Brother   . Cancer Brother        liver cancer  . Melanoma Brother   . Other Brother        Bypass surgery   . Hypertension  Sister   . Ovarian cancer Paternal Grandmother        was PMP  . Colon cancer Neg Hx     Review of Systems  All other systems reviewed and are negative.   Exam:   BP 140/80 (BP Location: Left Arm, Patient Position: Sitting, Cuff Size: Normal)   Pulse 60   Resp 16   Ht 5' 2.75" (1.594 m)   Wt 141 lb (64 kg)   LMP 11/07/2002   BMI 25.18 kg/m   Height: 5' 2.75" (159.4 cm)  Ht Readings from Last 3 Encounters:  02/19/18 5' 2.75" (1.594 m)  09/19/17 5' 2.5" (1.588 m)  06/09/17 5' 3.5" (1.613 m)    General appearance: alert, cooperative and appears stated age Head: Normocephalic, without obvious abnormality, atraumatic Neck: no adenopathy, supple, symmetrical, trachea midline and thyroid normal to inspection and palpation Lungs: clear to auscultation bilaterally Breasts: normal appearance, no masses or tenderness Heart: regular  rate and rhythm Abdomen: soft, non-tender; bowel sounds normal; no masses,  no organomegaly Extremities: extremities normal, atraumatic, no cyanosis or edema Skin: Skin color, texture, turgor normal. No rashes or lesions Lymph nodes: Cervical, supraclavicular, and axillary nodes normal. No abnormal inguinal nodes palpated Neurologic: Grossly normal   Pelvic: External genitalia:  no lesions              Urethra:  normal appearing urethra with no masses, tenderness or lesions              Bartholins and Skenes: normal                 Vagina: normal appearing vagina with normal color and discharge, no lesions              Cervix: no lesions              Pap taken: Yes.   Bimanual Exam:  Uterus:  normal size, contour, position, consistency, mobility, non-tender              Adnexa: normal adnexa and no mass, fullness, tenderness               Rectovaginal: Confirms               Anus:  normal sphincter tone, no lesions  Chaperone was present for exam.  A:  Well Woman with normal exam PMP, on HRT H/O rectal cancer GERD Hypertension Elevated lipids Osteoporosis   P:   Mammogram guidelines reviewed pap smear obtained today Seeing new dermatologist Will repeat BMD next year.  Not interested in medication at this point. Aware she will start pneumonia vaccines this year Will look into Shingrix vaccination return annually or prn

## 2018-02-20 ENCOUNTER — Encounter: Payer: Self-pay | Admitting: Obstetrics & Gynecology

## 2018-02-20 LAB — CYTOLOGY - PAP: Diagnosis: NEGATIVE

## 2018-02-20 NOTE — Telephone Encounter (Signed)
Patient sent the following message through [place]. Routing to triage to assist patient with request.   ----- Message from [name], Generic sent at 02/20/2018 10:57 AM EDT -----    Hi there - some questions on my bone density. I looked at the at the lab results for the last bone density test in 2018. I see where it says one hip is -2.6 and spine is -0.6. Confirm if you will which hip is -2.6. And what is the score for the other hip? I think Dr. Sabra Heck said one hip was bad and the other was OK.    And I tried to look at my bone density results from the one before this - in 2015 - but there's no results. Would you let me know what were the results of the 2015 test? I'd like to see the comparison between 2015 and 2018.    Thank you!  Alexa [name]

## 2018-02-22 IMAGING — MR MR PELVIS WO/W CM
4 of 8 series · 19 of 48 positions shown · IV contrast (multihance)
Comparison: CT scan 05/19/2017.

CLINICAL DATA: Patient had recent rectal polyp ectomy which
revealed cancer within the specimen. Subsequent transanal excision
of the polypectomy site was performed 06/09/2017. Polypectomy scar
identified at the anterior midline about 3-4 mm proximal to the
dentate line. Negative margins at the time of mucosal resection.

EXAM:
MRI PELVIS WITHOUT AND WITH CONTRAST
TECHNIQUE: Multiplanar multisequence MR imaging of the pelvis was performed
both before and after administration of intravenous contrast.
CONTRAST:  13mL MULTIHANCE GADOBENATE DIMEGLUMINE 529 MG/ML IV SOLN

[Series 2: T2 · sagittal · 3.0mm · 0.62mm/px · 6 of 36 slices shown (1 of 3)]
[im 1/36]
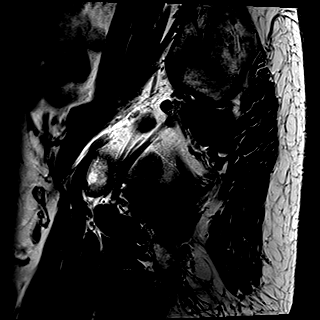
[im 8/36]
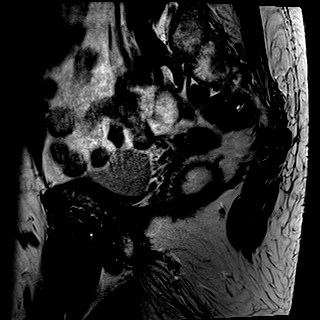
[im 15/36]
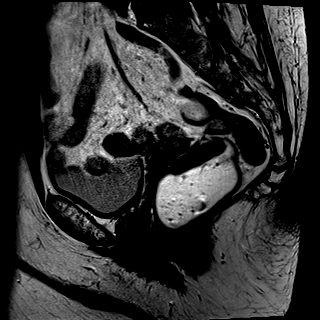
[im 22/36]
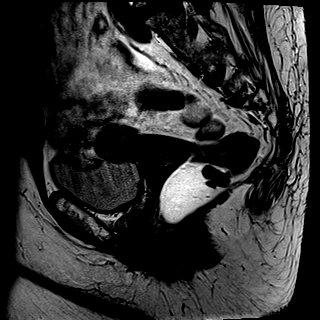
[im 29/36]
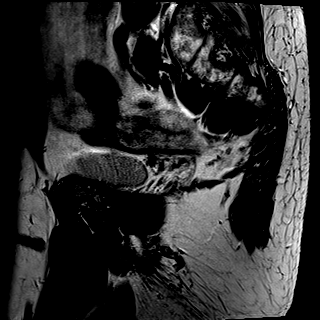
[im 36/36]
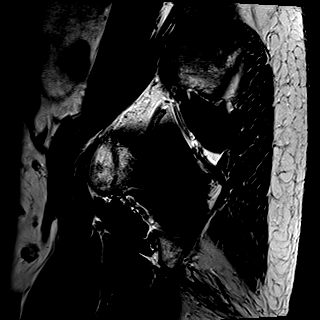

[Series 3: T2 · coronal · 3.0mm · 0.38mm/px · 6 of 35 slices shown (2 of 3)]
[im 1/35]
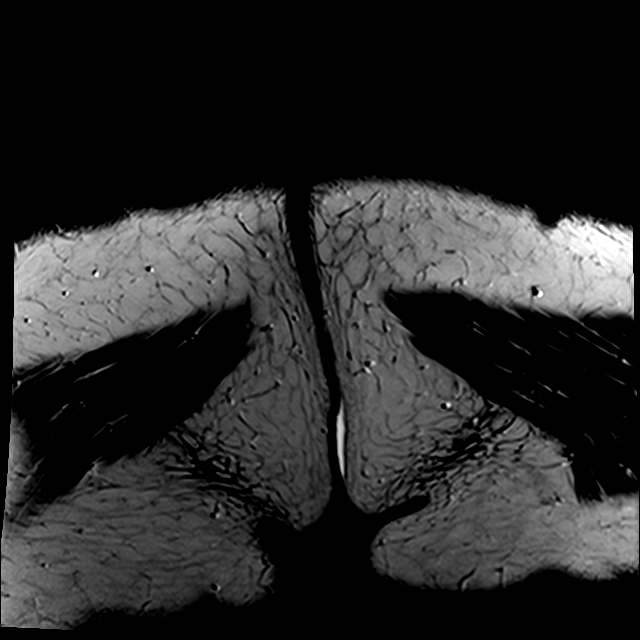
[im 7/35]
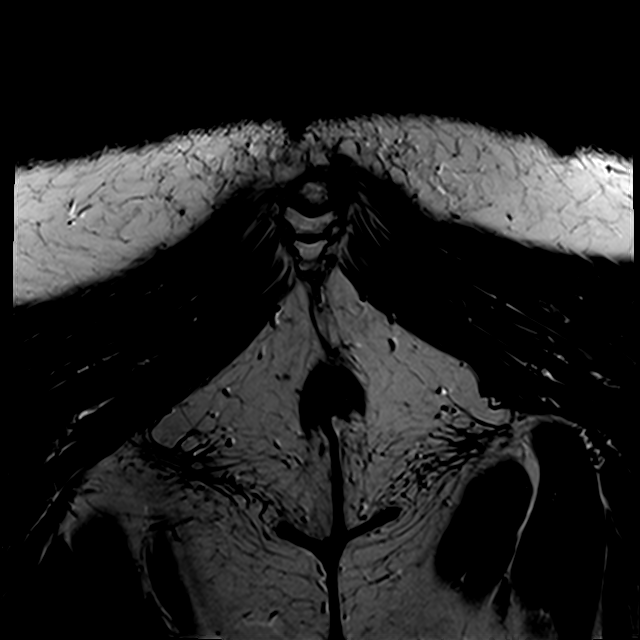
[im 14/35]
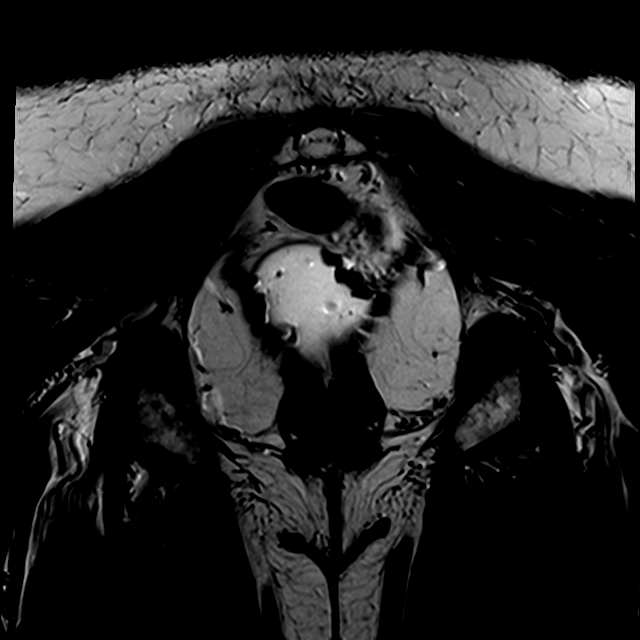
[im 21/35]
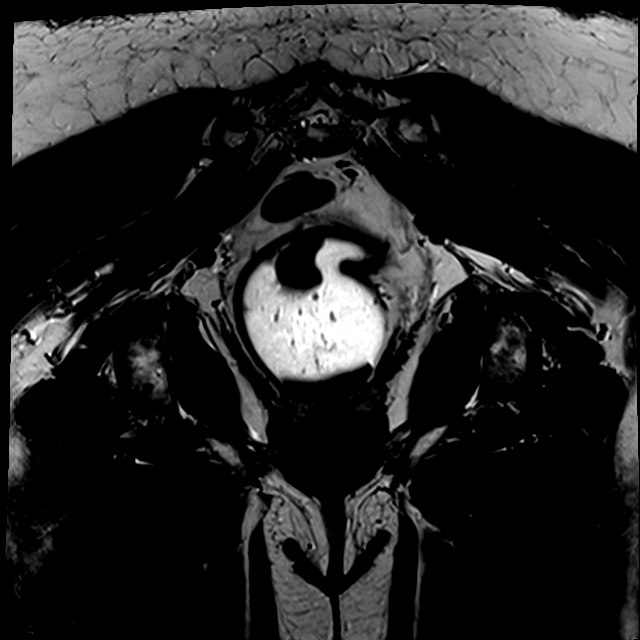
[im 28/35]
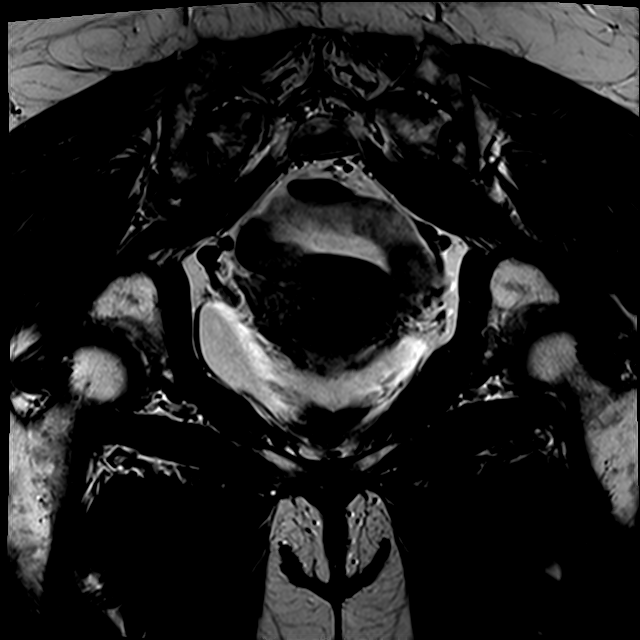
[im 35/35]
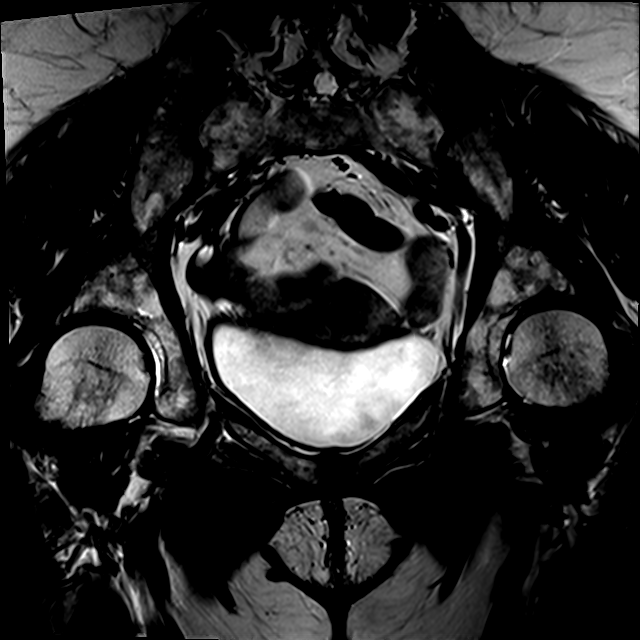

[Series 4: T2 · axial · 3.0mm · 0.38mm/px · z∈[-33,+57]mm · 4 of 35 slices shown (3 of 3)]
[im 1/35]
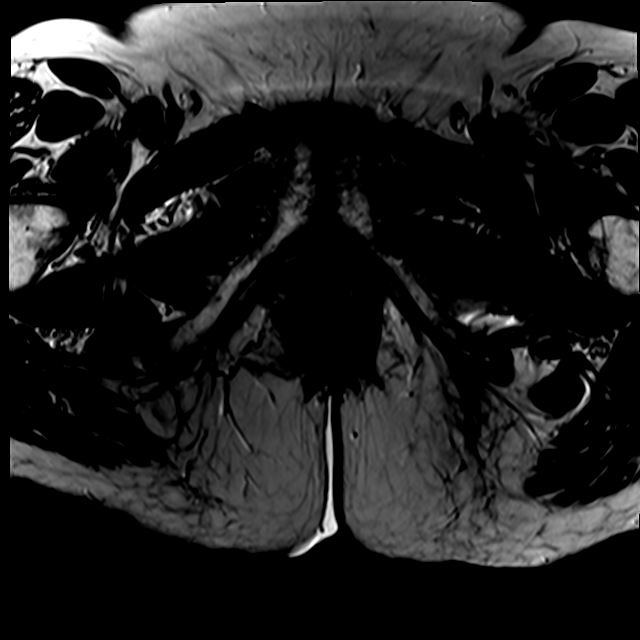
[im 7/35]
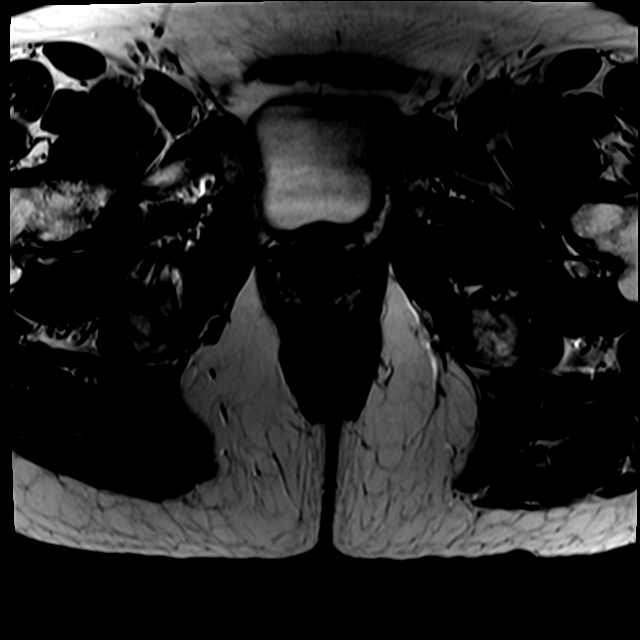
[im 21/35]
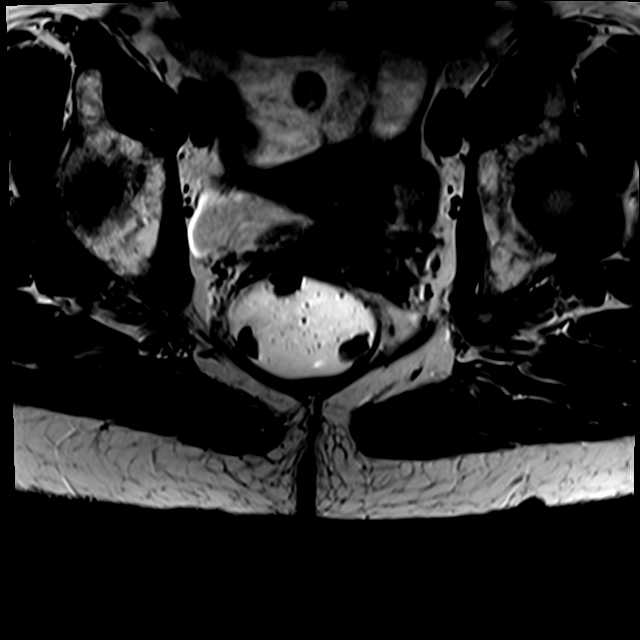
[im 35/35]
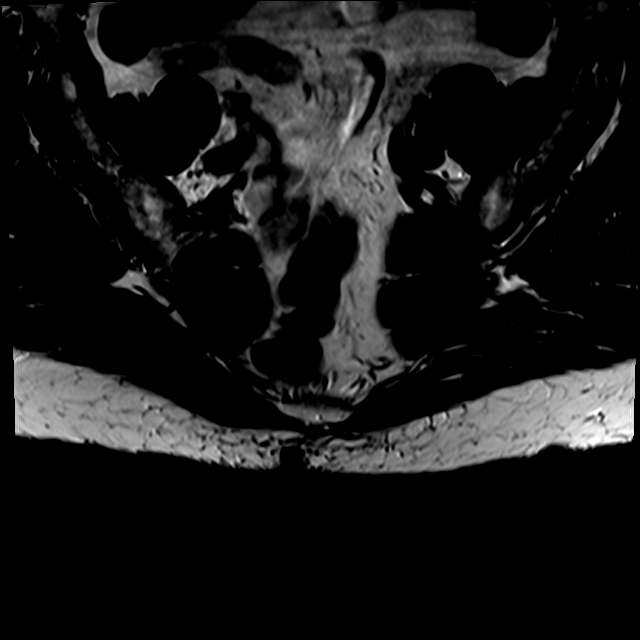

[Series 5: T1 fat-sat · axial · 5.0mm · 1.04mm/px · z∈[-73,+119]mm · 3 of 40 slices shown]
[im 8/40]
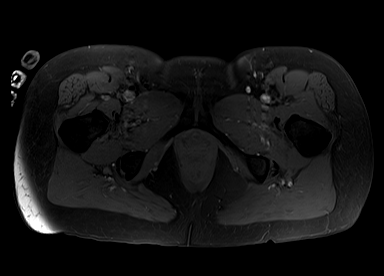
[im 24/40]
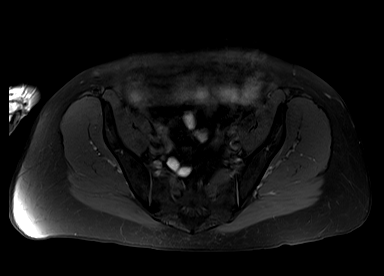
[im 40/40]
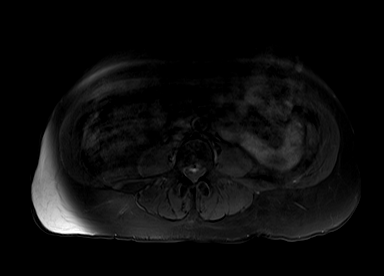

[19 of 48 positions shown; findings below may reference images not displayed]

FINDINGS: Urinary Tract:  Unremarkable.  No urethral diverticulum.

Bowel: Visualized large and small bowel in the lower pelvis is
unremarkable. Distal rectum has been filled with gel. Rectal wall is
unremarkable. No irregularity or thickening of the anterior wall.
There is no perirectal edema or fluid evident.

Vascular/Lymphatic: Major arterial and venous anatomy in the pelvic
sidewall appears patent. No suspicious perirectal lymph nodes are
evident. No evidence for pelvic sidewall lymphadenopathy along the
internal iliac chains.

Reproductive:  Uterus unremarkable.  No evidence for adnexal mass.

Other:  No intraperitoneal free fluid.

Musculoskeletal: No abnormal marrow enhancement within the
visualized bony anatomy.
IMPRESSION: Unremarkable exam. Specifically, no abnormality identified in the
wall the rectum. No suspicious perirectal or pelvic sidewall lymph
nodes.

## 2018-03-02 ENCOUNTER — Telehealth: Payer: Self-pay

## 2018-03-02 NOTE — Telephone Encounter (Signed)
BMD 2018 - L hip -2.6, R hip -2.5, spine -0.6.  BMD 2015 - L hip -2.2, R hip -2.4, spine -0.3.

## 2018-03-02 NOTE — Telephone Encounter (Signed)
See telephone encounter.

## 2018-03-02 NOTE — Telephone Encounter (Signed)
Non-Urgent Medical Question  Message [id number]  From [name], [name] To [name], MD Sent 02/20/2018 10:57 AM  Hi there - some questions on my bone density. I looked at the at the lab results for the last bone density test in 2018. I see where it says one hip is -2.6 and spine is -0.6. Confirm if you will which hip is -2.6. And what is the score for the other hip? I think Dr. Sabra Heck said one hip was bad and the other was OK.   And I tried to look at my bone density results from the one before this - in 2015 - but there's no results. Would you let me know what were the results of the 2015 test? I'd like to see the comparison between 2015 and 2018.   Thank you!  Alexa [name]   Pool - [facility] Triage Pool No one has taken responsibility for this message.  [address]   User Action Time  Jana Half     DualFemur Neck Left  09/14/2017    64.4         -2.6    0.683 g/cm2  AP Spine  L1-L4 (L3) 09/14/2017    64.4         -0.6    1.111 g/cm2  Right femur neck was -2.4 in 2015 Spine was -0.1 in 2015.  Routing to Dr.Silva to advise as Dr.Miller is out of the office.

## 2018-03-09 ENCOUNTER — Encounter: Payer: Self-pay | Admitting: Obstetrics & Gynecology

## 2018-04-04 ENCOUNTER — Other Ambulatory Visit (HOSPITAL_COMMUNITY): Payer: Self-pay | Admitting: Dermatology

## 2018-04-04 DIAGNOSIS — C8590 Non-Hodgkin lymphoma, unspecified, unspecified site: Secondary | ICD-10-CM

## 2018-04-11 ENCOUNTER — Ambulatory Visit (HOSPITAL_COMMUNITY): Payer: Managed Care, Other (non HMO)

## 2018-04-20 ENCOUNTER — Encounter (HOSPITAL_COMMUNITY)
Admission: RE | Admit: 2018-04-20 | Discharge: 2018-04-20 | Disposition: A | Discharge status: Home / Self Care | Payer: Managed Care, Other (non HMO) | Source: Ambulatory Visit | Attending: Dermatology | Admitting: Dermatology

## 2018-04-20 DIAGNOSIS — C8590 Non-Hodgkin lymphoma, unspecified, unspecified site: Secondary | ICD-10-CM | POA: Diagnosis not present

## 2018-04-20 LAB — GLUCOSE, CAPILLARY: Glucose-Capillary: 102 mg/dL — ABNORMAL HIGH (ref 65–99)

## 2018-04-20 MED ORDER — FLUDEOXYGLUCOSE F - 18 (FDG) INJECTION
6.9000 | Freq: Once | INTRAVENOUS | Status: AC | PRN
  Administered 2018-04-20: 6.9 via INTRAVENOUS

## 2018-04-27 ENCOUNTER — Ambulatory Visit: Payer: Managed Care, Other (non HMO) | Admitting: Obstetrics & Gynecology

## 2018-05-04 ENCOUNTER — Encounter: Payer: Self-pay | Admitting: Obstetrics & Gynecology

## 2018-09-21 ENCOUNTER — Telehealth: Payer: Self-pay | Admitting: Obstetrics & Gynecology

## 2018-09-21 NOTE — Telephone Encounter (Signed)
Return call to patient.  Left message to call back. Can speak with any nurse. Patient with hx of Cat C breast Density.  Needs 3D if possible for patient.  Last mammogram at [facility] on 09/21/17.

## 2018-09-21 NOTE — Telephone Encounter (Signed)
Spoke with patient. Patient is due for screening MMG asking if she needs to start with diagnostic MMG or 3D because of abnormal MMG in 2018?  Patient denies any changes in breast, no nipple d/c, lumps, tenderness or pain. Reviewed Dx MMG dated 09/21/17, advised patient to continue with scheduling 3D screening MMG. If additional imaging is needed after screening completed, will be notified by [facility] and scheduled. Patient verbalizes understanding.   Routing to provider for final review. Patient is agreeable to disposition. Will close encounter.

## 2018-09-21 NOTE — Telephone Encounter (Signed)
Patient is due for mammogram and has questions for the nurse before scheduling.

## 2018-11-02 ENCOUNTER — Other Ambulatory Visit: Payer: Self-pay | Admitting: Obstetrics & Gynecology

## 2018-11-02 DIAGNOSIS — Z1231 Encounter for screening mammogram for malignant neoplasm of breast: Secondary | ICD-10-CM

## 2018-11-09 ENCOUNTER — Ambulatory Visit
Admission: RE | Admit: 2018-11-09 | Discharge: 2018-11-09 | Disposition: A | Discharge status: Home / Self Care | Payer: Managed Care, Other (non HMO) | Source: Ambulatory Visit

## 2018-11-09 DIAGNOSIS — Z1231 Encounter for screening mammogram for malignant neoplasm of breast: Secondary | ICD-10-CM

## 2018-11-27 ENCOUNTER — Ambulatory Visit
Admission: RE | Admit: 2018-11-27 | Discharge: 2018-11-27 | Disposition: A | Discharge status: Home / Self Care | Payer: Managed Care, Other (non HMO) | Source: Ambulatory Visit | Attending: Internal Medicine | Admitting: Internal Medicine

## 2018-11-27 ENCOUNTER — Other Ambulatory Visit: Payer: Self-pay | Admitting: Internal Medicine

## 2018-11-27 DIAGNOSIS — R059 Cough, unspecified: Secondary | ICD-10-CM

## 2018-11-27 DIAGNOSIS — R05 Cough: Secondary | ICD-10-CM

## 2018-12-11 DIAGNOSIS — C8400 Mycosis fungoides, unspecified site: Secondary | ICD-10-CM | POA: Diagnosis not present

## 2019-01-21 DIAGNOSIS — Z79899 Other long term (current) drug therapy: Secondary | ICD-10-CM | POA: Diagnosis not present

## 2019-04-16 DIAGNOSIS — C8400 Mycosis fungoides, unspecified site: Secondary | ICD-10-CM | POA: Diagnosis not present

## 2019-04-29 ENCOUNTER — Encounter: Payer: Self-pay | Admitting: Obstetrics & Gynecology

## 2019-04-30 ENCOUNTER — Telehealth: Payer: Self-pay | Admitting: Obstetrics & Gynecology

## 2019-04-30 ENCOUNTER — Other Ambulatory Visit: Payer: Self-pay

## 2019-04-30 NOTE — Telephone Encounter (Signed)
Call reviewed with Dr. Sabra Heck, call returned to patient. Questions answered, no additional appt needed at this time, will see Dr. Sabra Heck 6/26. Patient will discuss labs at [place], is aware she will need to establish care with PCP for Medicare Wellness physical. Patient thankful for return call and verbalizes understanding.   Routing to provider for final review. Patient is agreeable to disposition. Will close encounter.

## 2019-04-30 NOTE — Telephone Encounter (Signed)
Patient sent the following correspondence through [place]. Routing to triage to assist patient with request.  Hi Dr. Sabra Heck - I hope you are doing well - staying safe and well - you and your family.    I have an appt to see you this Friday for my annual visit. Little nervous but figure your office wouldn't be seeing people unless you were very comfortable.    I have a question - I have once again lost my PCP. Dr. Coralyn Mark retired in May. One week before my physical with her. So I'm searching again for a PCP. And it may be harder as I am now on Medicare.    When I visit you Friday, can we talk about more primary care stuff than we usually do?   Can you do bloodwork that my PCP would normally do for a physical?   Can you do a Medicare welcome physical?   Can we talk about immunizations - my doctor at [facility] - Dr. Irish Elders - for my cutaneous Tcell lymphoma - is OK with me getting a pneumonia vaccine and shingles. Both dead virus vaccines. But I'd like to talk to a doctor before I get the vaccines.    Please let me know if we can discuss these topics at my Friday appt.    Thank you Dr. Sabra Heck -  Alexa [name]

## 2019-05-03 ENCOUNTER — Other Ambulatory Visit: Payer: Self-pay

## 2019-05-03 ENCOUNTER — Other Ambulatory Visit (HOSPITAL_COMMUNITY)
Admission: RE | Admit: 2019-05-03 | Discharge: 2019-05-03 | Disposition: A | Discharge status: Home / Self Care | Payer: Medicare Other | Source: Ambulatory Visit | Attending: Obstetrics & Gynecology | Admitting: Obstetrics & Gynecology

## 2019-05-03 ENCOUNTER — Ambulatory Visit (INDEPENDENT_AMBULATORY_CARE_PROVIDER_SITE_OTHER): Payer: Medicare Other | Admitting: Obstetrics & Gynecology

## 2019-05-03 ENCOUNTER — Encounter: Payer: Self-pay | Admitting: Obstetrics & Gynecology

## 2019-05-03 VITALS — BP 132/68 | HR 80 | Temp 97.5°F | Ht 62.75 in | Wt 125.0 lb

## 2019-05-03 DIAGNOSIS — F320 Major depressive disorder, single episode, mild: Secondary | ICD-10-CM | POA: Diagnosis not present

## 2019-05-03 DIAGNOSIS — Z124 Encounter for screening for malignant neoplasm of cervix: Secondary | ICD-10-CM | POA: Insufficient documentation

## 2019-05-03 DIAGNOSIS — M810 Age-related osteoporosis without current pathological fracture: Secondary | ICD-10-CM | POA: Diagnosis not present

## 2019-05-03 DIAGNOSIS — Z01419 Encounter for gynecological examination (general) (routine) without abnormal findings: Secondary | ICD-10-CM | POA: Diagnosis not present

## 2019-05-03 MED ORDER — ESCITALOPRAM OXALATE 10 MG PO TABS: ORAL_TABLET | ORAL | 1 refills | Status: DC

## 2019-05-03 NOTE — Patient Instructions (Addendum)
[facility] at [facility]: [place], [place], [place] [address]   Phone: [phone]  [address] [place], [place] Main Line: [phone] Grier Mitts, MD

## 2019-05-03 NOTE — Progress Notes (Signed)
68 y.o. G1P0 Single White or Caucasian female here for annual exam.  She's lost about 16 pounds over the past year.  She purposefully lost about 10 pounds.  She didn't really didn't try to lose the additional 6 pounds.  She is having some skin issues.  Has done a phone call with her dermatologist.  On inferfon and methotrexate.  Retired in January.  Is having some situation depression.  Has a friend who is a therapist and thinks she needs treatment.  Has not been able to travel as she thought she would.  Does have some friend support.  Appetite is definitively decreased.       Office Visit from 05/03/2019 in [facility]  PHQ-9 Total Score  9     Last appt with Dr. Collene Mares was 6/19.  Has follow up with [facility] later this year.    Patient's last menstrual period was 11/07/2002.          Sexually active: No.  The current method of family planning is post menopausal status.    Exercising: Yes.    walking, videos  Smoker:  no  Health Maintenance: Pap:  02/19/18 Neg   01/27/17 neg. HR HPV:neg  History of abnormal Pap:  yes MMG:  11/09/18 BIRADS1:neg  Colonoscopy:  05/04/18 normal. F/u 3 years  BMD:   09/14/17 Osteoporosis  TDaP:  2011 Pneumonia vaccine(s):  Needs to have this done Shingrix:   Desires to start this Hep C testing: 01/19/16 neg  Screening Labs: PCP   reports that she has never smoked. She has never used smokeless tobacco. She reports current alcohol use. She reports that she does not use drugs.  Past Medical History:  Diagnosis Date  . Adenomatous polyp   . Cancer ([place])    cutaneous T-cell lymphoma; followed by Dr. Clovis Riley @ [facility]  . Family history of anesthesia complication    sister and sister's children postitive for [place]!!!!!!, pt tested negative 3-52yrs ago  . GERD (gastroesophageal reflux disease)   . Hyperlipemia   . Hypertension    off medication  . 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 Osteopenia    bilateral hips  . Rectal cancer ([place])  04/2017    Past Surgical History:  Procedure Laterality Date  . COLONOSCOPY  5/13   with biopsy  . DILATATION & CURRETTAGE/HYSTEROSCOPY WITH RESECTOCOPE N/A 06/02/2014   Procedure: [place];  Surgeon: Lyman Speller, MD;  Location: [facility];  Service: Gynecology;  Laterality: N/A;  . FOOT SURGERY     Right   . TRANSANAL EXCISION OF RECTAL MASS N/A 06/09/2017   Procedure: TRANSANAL EXCISION OF RECTAL MASS;  Surgeon: [name], MD;  Location: [facility];  Service: General;  Laterality: N/A;    Current Outpatient Medications  Medication Sig Dispense Refill  . Cod Liver Oil 1000 MG CAPS Take by mouth.    . ELDERBERRY PO Take by mouth daily.    . folic acid (FOLVITE) 315 MCG tablet Take by mouth daily.    . interferon alfa-2b (INTRON A) 6000000 UNIT/ML injection Inject 0.5 cc (3 Mu) three times a week subcutaneously    . methotrexate (RHEUMATREX) 2.5 MG tablet as directed.    . triamcinolone ointment (KENALOG) 0.1 % Apply to affected areas as needed     No current facility-administered medications for this visit.     Family History  Problem Relation Age of Onset  . Anuerysm Father   . Heart  disease Father   . Diabetes Father   . Cancer Father        unknown type  . Hypertension Father   . Heart attack Mother   . Heart disease Mother   . Kidney disease Mother   . Kidney cancer Mother        contained tumor  . Hypertension Mother   . Heart attack Brother   . Cancer Brother        liver cancer  . Melanoma Brother   . Other Brother        Bypass surgery   . Hypertension Sister   . Ovarian cancer Paternal Grandmother        was PMP  . Colon cancer Neg Hx     Review of Systems  All other systems reviewed and are negative.   Exam:   BP 132/68   Pulse 80   Temp (!) 97.5 F (36.4 C) (Temporal)   Ht 5' 2.75" (1.594 m)   Wt 125 lb (56.7 kg)   LMP 11/07/2002   BMI 22.32 kg/m     Height: 5' 2.75" (159.4  cm)  Ht Readings from Last 3 Encounters:  05/03/19 5' 2.75" (1.594 m)  02/19/18 5' 2.75" (1.594 m)  09/19/17 5' 2.5" (1.588 m)    General appearance: alert, cooperative and appears stated age Head: Normocephalic, without obvious abnormality, atraumatic Neck: no adenopathy, supple, symmetrical, trachea midline and thyroid normal to inspection and palpation Lungs: clear to auscultation bilaterally Breasts: normal appearance, no masses or tenderness Heart: regular rate and rhythm Abdomen: soft, non-tender; bowel sounds normal; no masses,  no organomegaly Extremities: extremities normal, atraumatic, no cyanosis or edema Skin: Skin color, texture, turgor normal. No rashes or lesions Lymph nodes: Cervical, supraclavicular, and axillary nodes normal. No abnormal inguinal nodes palpated Neurologic: Grossly normal   Pelvic: External genitalia:  no lesions              Urethra:  normal appearing urethra with no masses, tenderness or lesions              Bartholins and Skenes: normal                 Vagina: normal appearing vagina with normal color and discharge, no lesions              Cervix: no lesions              Pap taken: Yes.   Bimanual Exam:  Uterus:  normal size, contour, position, consistency, mobility, non-tender              Adnexa: normal adnexa and no mass, fullness, tenderness               Rectovaginal: Confirms               Anus:  normal sphincter tone, no lesions  Chaperone was present for exam.  A:  Well Woman with normal exam PMP, no HRT H/o rectal cancer 2018, followed by Dr. Marcello Moores GERD Osteoporosis, decided to monitor conservatively H/O elevated lipids Weight loss over last year (mostly intentional) Depression Mycosis fungoides, followed at [facility] Decreased WBC ct  P:   Mammogram guidelines reviewed.  Doing yearly. pap smear obtained BMD order placed to do this MMG in January Lexapro 10mg  daily.  Needs follow up web visit in 3 weeks.  #30/1RF Colonoscopy  due in two years, followed by Dr. Collene Mares Having CBC and CMP every 3 months with [facility] Information about  shingrix vaccination and pneumovax given to pt return annually or prn  ~15 minutes spent with patient >50% of time was in face to face discussion with pt of depression symptoms, treatment, and follow-up in additional routine gyn care.

## 2019-05-06 LAB — CYTOLOGY - PAP: Diagnosis: NEGATIVE

## 2019-05-07 ENCOUNTER — Encounter: Payer: Self-pay | Admitting: Obstetrics & Gynecology

## 2019-05-08 ENCOUNTER — Telehealth: Payer: Self-pay | Admitting: Obstetrics & Gynecology

## 2019-05-08 NOTE — Telephone Encounter (Signed)
Patient sent the following correspondence through [place]. Routing to triage to assist patient with request.  Thank you Dr. Sabra Heck for the test results info! One less thing to be concerned about - until next year!    I'm starting the new prescription Wednesday, July 1. Fingers crossed all goes well.     Thank you again for all your advice and counsel during my appt last week.    Stay safe and well Alexa [name]   Cc: Dr. Sabra Heck for [facility]

## 2019-05-09 DIAGNOSIS — Z79899 Other long term (current) drug therapy: Secondary | ICD-10-CM | POA: Diagnosis not present

## 2019-05-16 ENCOUNTER — Telehealth: Payer: Self-pay | Admitting: Obstetrics & Gynecology

## 2019-05-16 NOTE — Telephone Encounter (Signed)
Patient was prescribed lexapro on 05/03/19 and has some questions about side effects. States Dr. Sabra Heck let her know that nausea was expected but she has also been experiencing some "light-headedness that may be dizziness". Patient would not like to continue taking medication.

## 2019-05-16 NOTE — Telephone Encounter (Signed)
Patient began Lexapro 10mg  (1/2 tablet) daily on 05-08-19. She has had some nausea which Dr.Miller warned her about. Patient got up this morning with slight dizziness--took Lexapro really late last PM. She's not sure if there is a better time to take medication--the 5mg  has already made a difference in her symptoms.   Advised patient to try taking at night with food. That way maybe she will avoid some of the nausea and dizziness. Patient would like to continue with medication. Hopefully after a few weeks, her body will adjust and she won't have any nausea or dizziness.  Will route to provider for any further recommendations.

## 2019-05-17 MED ORDER — PAROXETINE HCL 10 MG PO TABS: ORAL_TABLET | ORAL | 1 refills | Status: DC

## 2019-05-17 NOTE — Telephone Encounter (Signed)
Ok to switch to paxil 10mg , take 1/2 tab for next 6 days to see if still having same symptoms.  Ok to send in 10mg  daily rx to pharmacy.  Thanks.

## 2019-05-17 NOTE — Telephone Encounter (Signed)
Spoke with patient and notified of Dr.Miller's recommendations below. Patient states nausea is constant and doesn't think she can tolerate any longer. Patient states she is not eating much due to nausea. She is willing to switch to another SSRI if it won't cause nausea. She is going out of town this weekend for 4-5 days and doesn't want the nausea.  Routed to provider for recommendations.

## 2019-05-17 NOTE — Telephone Encounter (Signed)
I think your suggests are good.  If it continues, please have her call.  We can switch directly from one SSRI to another without titration down or off between medications so a switch it easy.

## 2019-05-17 NOTE — Telephone Encounter (Signed)
Patient notified will switch from Lexapro 10mg  to Paxil 10mg . Take 1/2 tab for next 6 days and see if still having same symptoms. Rx sent [facility] Patient voiced understanding.

## 2019-05-29 ENCOUNTER — Other Ambulatory Visit: Payer: Self-pay

## 2019-05-31 ENCOUNTER — Encounter: Payer: Self-pay | Admitting: Obstetrics & Gynecology

## 2019-05-31 ENCOUNTER — Telehealth (INDEPENDENT_AMBULATORY_CARE_PROVIDER_SITE_OTHER): Payer: Medicare Other | Admitting: Obstetrics & Gynecology

## 2019-05-31 DIAGNOSIS — F4321 Adjustment disorder with depressed mood: Secondary | ICD-10-CM | POA: Diagnosis not present

## 2019-05-31 NOTE — Progress Notes (Signed)
Virtual Visit via Video Note I connected with Kermit on 05/31/19 at  4:00 PM EDT by a video enabled telemedicine application and verified that I am speaking with the correct person using two identifiers.  Location: Patient: home Provider: office   I discussed the limitations of evaluation and management by telemedicine and the availability of in person appointments. The patient expressed understanding and agreed to proceed.  History of Present Illness: 68 yo G1P0 SWF for discussion She was started on Lexapro but experienced nausea and lightheadedness.  She was switched to Paxil to Paxil 5mg  daily for about 12 days.  She had minimal nausea and had minimal lightheadedness.  These have resolved at this point.  She had some insomnia when she started the Paxil but this seems to have resolved as well.     Observations/Objective: PHQ-9:  4 Prior was a score of 9  Assessment and Plan: Depression with some anxiety, much improved  Follow Up Instructions: Continue 5mg  Paxil daily.  She is going to give me an update in 3 weeks.  Does not need rx at this time.     I discussed the assessment and treatment plan with the patient. The patient was provided an opportunity to ask questions and all were answered. The patient agreed with the plan and demonstrated an understanding of the instructions.   The patient was advised to call back or seek an in-person evaluation if the symptoms worsen or if the condition fails to improve as anticipated.  I provided 15 minutes of non-face-to-face time during this encounter.   [name], MD

## 2019-06-13 ENCOUNTER — Ambulatory Visit: Payer: Self-pay | Admitting: Family Medicine

## 2019-07-02 ENCOUNTER — Other Ambulatory Visit: Payer: Self-pay | Admitting: *Deleted

## 2019-07-02 NOTE — Telephone Encounter (Signed)
Patient states she is doing ok. She still having depression, motivation issues but states anxiety is better and everything else is going really great.   Patient states she is currently taking 5mg  every day. She asks if she can alternate dose, 5mg  one day and 10mg  the following day and so on.  Please advise.

## 2019-07-02 NOTE — Telephone Encounter (Signed)
Medication refill request: paxil 10mg  Last AEX:  05/03/19 SM Next AEX: not scheduled  Last MMG (if hormonal medication request): 11/09/18 BIRADS1:Neg  Refill authorized: 05/17/19 #[id number]. Today please advise.

## 2019-07-02 NOTE — Telephone Encounter (Signed)
Could you please call her get an update.  We were going to touch base in 3 weeks after last visit.  Also, confirm she wants the prescription to mail order for 90 days and for the 10mg  doage.  Thanks.

## 2019-07-02 NOTE — Telephone Encounter (Signed)
Yes, she can alternate 5mg  and 10mg  every other day.  She can also go up to the 10mg  daily is she desires.

## 2019-07-04 MED ORDER — PAROXETINE HCL 10 MG PO TABS: 10.0000 mg | ORAL_TABLET | Freq: Every day | ORAL | 1 refills | Status: DC

## 2019-07-04 NOTE — Telephone Encounter (Signed)
Left voice mail to call back 

## 2019-07-04 NOTE — Telephone Encounter (Signed)
Patient notified.  Rx sent to pharmacy confirmed by patient.

## 2019-07-04 NOTE — Telephone Encounter (Signed)
Patient is returning call to [place]. °

## 2019-07-16 ENCOUNTER — Encounter: Payer: Self-pay | Admitting: Obstetrics & Gynecology

## 2019-07-16 DIAGNOSIS — C8400 Mycosis fungoides, unspecified site: Secondary | ICD-10-CM | POA: Diagnosis not present

## 2019-07-26 DIAGNOSIS — C8400 Mycosis fungoides, unspecified site: Secondary | ICD-10-CM | POA: Diagnosis not present

## 2019-08-05 DIAGNOSIS — C8400 Mycosis fungoides, unspecified site: Secondary | ICD-10-CM | POA: Diagnosis not present

## 2019-08-08 DIAGNOSIS — C8400 Mycosis fungoides, unspecified site: Secondary | ICD-10-CM | POA: Diagnosis not present

## 2019-08-12 DIAGNOSIS — C8400 Mycosis fungoides, unspecified site: Secondary | ICD-10-CM | POA: Diagnosis not present

## 2019-08-13 DIAGNOSIS — Z0000 Encounter for general adult medical examination without abnormal findings: Secondary | ICD-10-CM | POA: Diagnosis not present

## 2019-08-13 DIAGNOSIS — F418 Other specified anxiety disorders: Secondary | ICD-10-CM | POA: Insufficient documentation

## 2019-08-13 DIAGNOSIS — C19 Malignant neoplasm of rectosigmoid junction: Secondary | ICD-10-CM | POA: Diagnosis not present

## 2019-08-13 DIAGNOSIS — Z1329 Encounter for screening for other suspected endocrine disorder: Secondary | ICD-10-CM | POA: Diagnosis not present

## 2019-08-13 DIAGNOSIS — C8590 Non-Hodgkin lymphoma, unspecified, unspecified site: Secondary | ICD-10-CM | POA: Insufficient documentation

## 2019-08-13 DIAGNOSIS — Z23 Encounter for immunization: Secondary | ICD-10-CM | POA: Diagnosis not present

## 2019-08-13 DIAGNOSIS — Z13220 Encounter for screening for lipoid disorders: Secondary | ICD-10-CM | POA: Diagnosis not present

## 2019-08-15 DIAGNOSIS — C8400 Mycosis fungoides, unspecified site: Secondary | ICD-10-CM | POA: Diagnosis not present

## 2019-08-19 DIAGNOSIS — C8400 Mycosis fungoides, unspecified site: Secondary | ICD-10-CM | POA: Diagnosis not present

## 2019-08-19 DIAGNOSIS — Z23 Encounter for immunization: Secondary | ICD-10-CM | POA: Diagnosis not present

## 2019-08-22 DIAGNOSIS — C8400 Mycosis fungoides, unspecified site: Secondary | ICD-10-CM | POA: Diagnosis not present

## 2019-08-26 DIAGNOSIS — C8590 Non-Hodgkin lymphoma, unspecified, unspecified site: Secondary | ICD-10-CM | POA: Diagnosis not present

## 2019-08-26 DIAGNOSIS — E785 Hyperlipidemia, unspecified: Secondary | ICD-10-CM | POA: Diagnosis not present

## 2019-08-26 DIAGNOSIS — C19 Malignant neoplasm of rectosigmoid junction: Secondary | ICD-10-CM | POA: Diagnosis not present

## 2019-08-29 DIAGNOSIS — C8400 Mycosis fungoides, unspecified site: Secondary | ICD-10-CM | POA: Diagnosis not present

## 2019-09-02 DIAGNOSIS — C8400 Mycosis fungoides, unspecified site: Secondary | ICD-10-CM | POA: Diagnosis not present

## 2019-09-03 DIAGNOSIS — Z85048 Personal history of other malignant neoplasm of rectum, rectosigmoid junction, and anus: Secondary | ICD-10-CM | POA: Diagnosis not present

## 2019-09-05 DIAGNOSIS — C8400 Mycosis fungoides, unspecified site: Secondary | ICD-10-CM | POA: Diagnosis not present

## 2019-09-06 ENCOUNTER — Other Ambulatory Visit: Payer: Self-pay | Admitting: General Surgery

## 2019-09-06 DIAGNOSIS — Z85048 Personal history of other malignant neoplasm of rectum, rectosigmoid junction, and anus: Secondary | ICD-10-CM

## 2019-09-06 DIAGNOSIS — K6289 Other specified diseases of anus and rectum: Secondary | ICD-10-CM

## 2019-09-09 DIAGNOSIS — C8400 Mycosis fungoides, unspecified site: Secondary | ICD-10-CM | POA: Diagnosis not present

## 2019-09-12 DIAGNOSIS — C8400 Mycosis fungoides, unspecified site: Secondary | ICD-10-CM | POA: Diagnosis not present

## 2019-09-18 DIAGNOSIS — H40013 Open angle with borderline findings, low risk, bilateral: Secondary | ICD-10-CM | POA: Diagnosis not present

## 2019-09-18 DIAGNOSIS — H5203 Hypermetropia, bilateral: Secondary | ICD-10-CM | POA: Diagnosis not present

## 2019-09-18 DIAGNOSIS — H2513 Age-related nuclear cataract, bilateral: Secondary | ICD-10-CM | POA: Diagnosis not present

## 2019-09-18 DIAGNOSIS — H52203 Unspecified astigmatism, bilateral: Secondary | ICD-10-CM | POA: Diagnosis not present

## 2019-09-19 DIAGNOSIS — C8400 Mycosis fungoides, unspecified site: Secondary | ICD-10-CM | POA: Diagnosis not present

## 2019-09-20 ENCOUNTER — Ambulatory Visit
Admission: RE | Admit: 2019-09-20 | Discharge: 2019-09-20 | Disposition: A | Discharge status: Home / Self Care | Payer: Medicare Other | Source: Ambulatory Visit | Attending: General Surgery | Admitting: General Surgery

## 2019-09-20 ENCOUNTER — Other Ambulatory Visit: Payer: Self-pay

## 2019-09-20 DIAGNOSIS — Z85048 Personal history of other malignant neoplasm of rectum, rectosigmoid junction, and anus: Secondary | ICD-10-CM

## 2019-09-20 DIAGNOSIS — K6289 Other specified diseases of anus and rectum: Secondary | ICD-10-CM

## 2019-09-20 DIAGNOSIS — C20 Malignant neoplasm of rectum: Secondary | ICD-10-CM | POA: Diagnosis not present

## 2019-09-20 MED ORDER — IOPAMIDOL (ISOVUE-300) INJECTION 61%
100.0000 mL | Freq: Once | INTRAVENOUS | Status: AC | PRN
  Administered 2019-09-20: 100 mL via INTRAVENOUS

## 2019-09-23 DIAGNOSIS — C8400 Mycosis fungoides, unspecified site: Secondary | ICD-10-CM | POA: Diagnosis not present

## 2019-09-25 DIAGNOSIS — Z85048 Personal history of other malignant neoplasm of rectum, rectosigmoid junction, and anus: Secondary | ICD-10-CM | POA: Diagnosis not present

## 2019-09-25 DIAGNOSIS — Z79899 Other long term (current) drug therapy: Secondary | ICD-10-CM | POA: Diagnosis not present

## 2019-09-26 DIAGNOSIS — C8400 Mycosis fungoides, unspecified site: Secondary | ICD-10-CM | POA: Diagnosis not present

## 2019-10-10 DIAGNOSIS — C8400 Mycosis fungoides, unspecified site: Secondary | ICD-10-CM | POA: Diagnosis not present

## 2019-10-14 DIAGNOSIS — C8400 Mycosis fungoides, unspecified site: Secondary | ICD-10-CM | POA: Diagnosis not present

## 2019-10-17 DIAGNOSIS — C8400 Mycosis fungoides, unspecified site: Secondary | ICD-10-CM | POA: Diagnosis not present

## 2019-10-21 DIAGNOSIS — C8400 Mycosis fungoides, unspecified site: Secondary | ICD-10-CM | POA: Diagnosis not present

## 2019-11-14 DIAGNOSIS — C8400 Mycosis fungoides, unspecified site: Secondary | ICD-10-CM | POA: Diagnosis not present

## 2019-11-18 DIAGNOSIS — C8400 Mycosis fungoides, unspecified site: Secondary | ICD-10-CM | POA: Diagnosis not present

## 2019-11-19 DIAGNOSIS — M5431 Sciatica, right side: Secondary | ICD-10-CM | POA: Diagnosis not present

## 2019-11-19 DIAGNOSIS — G8929 Other chronic pain: Secondary | ICD-10-CM | POA: Diagnosis not present

## 2019-11-19 DIAGNOSIS — C19 Malignant neoplasm of rectosigmoid junction: Secondary | ICD-10-CM | POA: Diagnosis not present

## 2019-11-19 DIAGNOSIS — F418 Other specified anxiety disorders: Secondary | ICD-10-CM | POA: Diagnosis not present

## 2019-11-19 DIAGNOSIS — C8590 Non-Hodgkin lymphoma, unspecified, unspecified site: Secondary | ICD-10-CM | POA: Diagnosis not present

## 2019-11-19 DIAGNOSIS — M25572 Pain in left ankle and joints of left foot: Secondary | ICD-10-CM | POA: Diagnosis not present

## 2019-11-28 DIAGNOSIS — C8400 Mycosis fungoides, unspecified site: Secondary | ICD-10-CM | POA: Diagnosis not present

## 2019-12-02 DIAGNOSIS — C8400 Mycosis fungoides, unspecified site: Secondary | ICD-10-CM | POA: Diagnosis not present

## 2019-12-05 DIAGNOSIS — C8400 Mycosis fungoides, unspecified site: Secondary | ICD-10-CM | POA: Diagnosis not present

## 2019-12-09 ENCOUNTER — Ambulatory Visit: Payer: Medicare Other

## 2019-12-09 DIAGNOSIS — C8400 Mycosis fungoides, unspecified site: Secondary | ICD-10-CM | POA: Diagnosis not present

## 2019-12-12 DIAGNOSIS — C8400 Mycosis fungoides, unspecified site: Secondary | ICD-10-CM | POA: Diagnosis not present

## 2019-12-15 ENCOUNTER — Ambulatory Visit: Payer: Medicare Other

## 2019-12-16 DIAGNOSIS — C8400 Mycosis fungoides, unspecified site: Secondary | ICD-10-CM | POA: Diagnosis not present

## 2019-12-19 DIAGNOSIS — C8400 Mycosis fungoides, unspecified site: Secondary | ICD-10-CM | POA: Diagnosis not present

## 2019-12-23 DIAGNOSIS — C8400 Mycosis fungoides, unspecified site: Secondary | ICD-10-CM | POA: Diagnosis not present

## 2019-12-24 ENCOUNTER — Ambulatory Visit: Payer: Medicare Other

## 2020-01-02 DIAGNOSIS — C8400 Mycosis fungoides, unspecified site: Secondary | ICD-10-CM | POA: Diagnosis not present

## 2020-01-06 DIAGNOSIS — C8400 Mycosis fungoides, unspecified site: Secondary | ICD-10-CM | POA: Diagnosis not present

## 2020-01-09 DIAGNOSIS — C8400 Mycosis fungoides, unspecified site: Secondary | ICD-10-CM | POA: Diagnosis not present

## 2020-01-13 DIAGNOSIS — Z79899 Other long term (current) drug therapy: Secondary | ICD-10-CM | POA: Diagnosis not present

## 2020-01-13 DIAGNOSIS — D485 Neoplasm of uncertain behavior of skin: Secondary | ICD-10-CM | POA: Diagnosis not present

## 2020-01-13 DIAGNOSIS — C8400 Mycosis fungoides, unspecified site: Secondary | ICD-10-CM | POA: Diagnosis not present

## 2020-01-13 DIAGNOSIS — D224 Melanocytic nevi of scalp and neck: Secondary | ICD-10-CM | POA: Diagnosis not present

## 2020-01-14 ENCOUNTER — Telehealth: Payer: Self-pay

## 2020-01-14 ENCOUNTER — Encounter: Payer: Self-pay | Admitting: Obstetrics & Gynecology

## 2020-01-14 NOTE — Telephone Encounter (Signed)
Pt sent following mychart message.    Glasscock, Link Snuffer, MD 2 hours ago (9:22 AM)   Hi Dr Sabra Heck!  Hope you and your family and you office staff are all well!  2 things.  First - had my 2nd COVID vaccine yesterday, Monday March 8. Have read I should wait at least a month before getting mammogram. Was due in Jan but put it off because virus cases were so high. So I'm planning to schedule for latter part of April. That and bone density. Just wanted you to be aware it will be a little late.   Second -I want to refer someone to you. New neighbor. Just moved here from [place]. 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  Can't remember which!    What does she need to do to become a new patient of yours?  Her name is Chrissie Quita Skye.   Thank you Dr. Sabra Heck!   Routing to Dr Sabra Heck for review and recommendations.

## 2020-01-17 NOTE — Telephone Encounter (Signed)
Please call pt and document Covid vaccination if it is not in Epic, please.  Also, just advise her to have friend call for appt.  She should have the office number but you can provide if she does not.  Thanks.

## 2020-01-21 ENCOUNTER — Other Ambulatory Visit: Payer: Self-pay | Admitting: Obstetrics & Gynecology

## 2020-01-21 DIAGNOSIS — M810 Age-related osteoporosis without current pathological fracture: Secondary | ICD-10-CM

## 2020-01-21 DIAGNOSIS — Z1231 Encounter for screening mammogram for malignant neoplasm of breast: Secondary | ICD-10-CM

## 2020-01-21 NOTE — Telephone Encounter (Signed)
Call placed to pt. Pt gave Covid vaccine dates and was updated in Epic. Pt to let friend know to come and see Dr Sabra Heck. Office number will be given to friend. Pt agreeable and thankful for her to be seen.   Routing to Dr Sabra Heck for review and will close encounter.

## 2020-02-03 DIAGNOSIS — C8400 Mycosis fungoides, unspecified site: Secondary | ICD-10-CM | POA: Diagnosis not present

## 2020-02-03 DIAGNOSIS — L299 Pruritus, unspecified: Secondary | ICD-10-CM | POA: Diagnosis not present

## 2020-02-03 DIAGNOSIS — Z79899 Other long term (current) drug therapy: Secondary | ICD-10-CM | POA: Diagnosis not present

## 2020-03-18 ENCOUNTER — Telehealth: Payer: Self-pay

## 2020-03-18 ENCOUNTER — Telehealth: Payer: Self-pay | Admitting: Obstetrics & Gynecology

## 2020-03-18 ENCOUNTER — Encounter: Payer: Self-pay | Admitting: Obstetrics & Gynecology

## 2020-03-18 NOTE — Telephone Encounter (Signed)
Pt sent following Mychart message:    Emanuelson, Link Snuffer, MD 1 hour ago (3:25 PM)   Hi there - it's [employer]. My annual appt for June 28 was just cancelled. All good - I understand.  What I don't understand is the reschedule in 2022!   They put me on the lengthy wait list. I try to make my annual appts as soon as your schedule is available and I understand things happen to reschedule. But a year???? Writing to see if there's any way to get an appt before next year.  Thank you!  Vaughan Basta

## 2020-03-18 NOTE — Telephone Encounter (Signed)
Spoke with pt. Pt rescheduled AEX with Dr Sabra Heck for 05/14/20 at 8 am. Pt had to reschedule due to Dr Sabra Heck out of town. Pt agreeable and verbalized understanding to date and time of appt.   Encounter closed.

## 2020-03-18 NOTE — Telephone Encounter (Signed)
Left message regarding upcoming appointment has been canceled and needs to be rescheduled. °

## 2020-03-31 DIAGNOSIS — S92352S Displaced fracture of fifth metatarsal bone, left foot, sequela: Secondary | ICD-10-CM | POA: Diagnosis not present

## 2020-03-31 DIAGNOSIS — R52 Pain, unspecified: Secondary | ICD-10-CM | POA: Diagnosis not present

## 2020-03-31 DIAGNOSIS — M6788 Other specified disorders of synovium and tendon, other site: Secondary | ICD-10-CM | POA: Diagnosis not present

## 2020-04-21 ENCOUNTER — Other Ambulatory Visit: Payer: Medicare Other

## 2020-04-21 ENCOUNTER — Ambulatory Visit: Payer: Medicare Other

## 2020-04-22 DIAGNOSIS — Z1159 Encounter for screening for other viral diseases: Secondary | ICD-10-CM | POA: Diagnosis not present

## 2020-04-22 DIAGNOSIS — C8400 Mycosis fungoides, unspecified site: Secondary | ICD-10-CM | POA: Diagnosis not present

## 2020-04-24 ENCOUNTER — Other Ambulatory Visit: Payer: Self-pay

## 2020-04-24 ENCOUNTER — Ambulatory Visit
Admission: RE | Admit: 2020-04-24 | Discharge: 2020-04-24 | Disposition: A | Discharge status: Home / Self Care | Payer: Medicare Other | Source: Ambulatory Visit | Attending: Obstetrics & Gynecology | Admitting: Obstetrics & Gynecology

## 2020-04-24 DIAGNOSIS — Z1231 Encounter for screening mammogram for malignant neoplasm of breast: Secondary | ICD-10-CM

## 2020-04-24 DIAGNOSIS — M810 Age-related osteoporosis without current pathological fracture: Secondary | ICD-10-CM

## 2020-04-29 ENCOUNTER — Other Ambulatory Visit: Payer: Self-pay | Admitting: Obstetrics & Gynecology

## 2020-04-29 DIAGNOSIS — R928 Other abnormal and inconclusive findings on diagnostic imaging of breast: Secondary | ICD-10-CM

## 2020-05-01 ENCOUNTER — Telehealth: Payer: Self-pay

## 2020-05-01 ENCOUNTER — Encounter: Payer: Self-pay | Admitting: Obstetrics & Gynecology

## 2020-05-01 NOTE — Telephone Encounter (Signed)
[name], Alexa [name]  P [facility] Clinical Pool Hi Dr. Sabra Heck. While I would rather not be on the border of osteoporosis, I was thrilled the scans were stable from 2018. Especially given everything else I have going on!  Didn't understand the comment about L2 & 3 being excluded because they were degenerative. And I'm doing this from memory. That may not be exactly what the result said. I see you July 8. We can discuss then  Did you see my mammogram results with the possible finding of lymphadenopathy, right armpit? Any thoughts? I have scheduled an ultra sound at their suggestion.  Several changes to update you on my mycosis fungoides when I see you July 8.

## 2020-05-01 NOTE — Telephone Encounter (Signed)
Pt sent update to Dr Sabra Heck. Pt has AEX scheduled for 05/14/20.   Routing to Dr Sabra Heck.

## 2020-05-01 NOTE — Telephone Encounter (Signed)
Left detailed message for pt regarding questions about degenerative changes noted on BMD and follow up MMG.  Advised to call back with any other questions and that I will review all of this with her in 05/14/2020 at her AEX.  Thanks.  Ok to close encounter.

## 2020-05-04 ENCOUNTER — Ambulatory Visit: Payer: Self-pay | Admitting: Obstetrics & Gynecology

## 2020-05-05 NOTE — Progress Notes (Signed)
69 y.o. G1P0 Single White or Caucasian female here for annual exam.  Has seen hematology/oncology this year at [facility].  New treatment is scheduled for Monday.  With recent MMG, she had lymphadenopathy noted.  Has biopsy scheduled for tomorrow just to rule out breast disease.  She did have her Covid vaccination in the right deltoid so she does wonder if this is related to the vaccination.  Is going to restart the Paxil at 5mg  daily.  Her motivation is a little decreased so she feels this would be good for her.    Exercising regularly.  Walks about five days a week.    Denies vaginal bleeding.    Patient's last menstrual period was 11/07/2002.          Sexually active: No.  The current method of family planning is post menopausal status.    Exercising: Yes.    walk & pilates,stretching Smoker:  no  Health Maintenance: Pap:  01-27-17 neg HPV HR neg, 02-19-18 neg, 05-03-2019 neg History of abnormal Pap:  yes MMG:  04-24-2020 category b , rt breast u/s 05-13-2020 birads 4: suspicious Colonoscopy:  05-04-18 normal f/u 59yrs BMD:   04-24-2020, stable -2.5 T score TDaP:  2011 Pneumonia vaccine(s):  2020 Shingrix:   2021 did 1st one Hep C testing: neg 2021 per patient Screening Labs: not indicated   reports that she has never smoked. She has never used smokeless tobacco. She reports current alcohol use. She reports that she does not use drugs.  Past Medical History:  Diagnosis Date  . Adenomatous polyp   . 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 Family history of anesthesia complication    sister and sister's children postitive for [place]!!!!!!, pt tested negative 3-25yrs ago  . GERD (gastroesophageal reflux disease)   . Hyperlipemia   . Hypertension    off medication  . Lymphoma ([place])    light box therapy  . 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 Osteopenia    bilateral hips  . Rectal cancer ([place]) 04/2017    Past Surgical History:  Procedure Laterality Date  .  COLONOSCOPY  5/13   with biopsy  . DILATATION & CURRETTAGE/HYSTEROSCOPY WITH RESECTOCOPE N/A 06/02/2014   Procedure: [place];  Surgeon: Lyman Speller, MD;  Location: [facility];  Service: Gynecology;  Laterality: N/A;  . FOOT SURGERY     Right   . TRANSANAL EXCISION OF RECTAL MASS N/A 06/09/2017   Procedure: TRANSANAL EXCISION OF RECTAL MASS;  Surgeon: [name], MD;  Location: [place];  Service: General;  Laterality: N/A;    Current Outpatient Medications  Medication Sig Dispense Refill  . cetirizine (ZYRTEC) 10 MG tablet Take 10 mg by mouth daily.    Marland Kitchen Cod Liver Oil 1000 MG CAPS Take by mouth.    . ELDERBERRY PO Take by mouth daily.    . folic acid (FOLVITE) 875 MCG tablet Take by mouth daily.    . interferon alfa-2b (INTRON A) 6000000 UNIT/ML injection Inject 0.5 cc (3 Mu) three times a week subcutaneously    . methotrexate (RHEUMATREX) 2.5 MG tablet Take 6 tablets by mouth once a week.    . triamcinolone cream (KENALOG) 0.1 %     . UNABLE TO FIND Wheat grass    . PARoxetine (PAXIL) 10 MG tablet Take by mouth. (Patient not taking: Reported on 05/14/2020)     No current facility-administered medications for this  visit.    Family History  Problem Relation Age of Onset  . Anuerysm Father   . Heart disease Father   . Diabetes Father   . Cancer Father        unknown type  . Hypertension Father   . Heart attack Mother   . Heart disease Mother   . Kidney disease Mother   . Kidney cancer Mother        contained tumor  . Hypertension Mother   . Heart attack Brother   . Cancer Brother        liver cancer  . Melanoma Brother   . Other Brother        Bypass surgery   . Hypertension Sister   . Ovarian cancer Paternal Grandmother        was PMP  . Colon cancer Neg Hx     Review of Systems  Constitutional: Negative.   HENT: Negative.   Eyes: Negative.   Respiratory: Negative.   Cardiovascular: Negative.    Gastrointestinal: Negative.   Endocrine: Negative.   Genitourinary: Negative.   Musculoskeletal: Negative.   Skin: Negative.   Allergic/Immunologic: Negative.   Neurological: Negative.   Hematological: Negative.   Psychiatric/Behavioral: Negative.     Exam:   BP 120/78   Pulse 68   Resp 16   Ht 5' 2.75" (1.594 m)   Wt 128 lb (58.1 kg)   LMP 11/07/2002   BMI 22.86 kg/m   Height: 5' 2.75" (159.4 cm)  General appearance: alert, cooperative and appears stated age Head: Normocephalic, without obvious abnormality, atraumatic Neck: no adenopathy, supple, symmetrical, trachea midline and thyroid normal to inspection and palpation Lungs: clear to auscultation bilaterally Breasts: normal appearance, no masses or tenderness Heart: regular rate and rhythm Abdomen: soft, non-tender; bowel sounds normal; no masses,  no organomegaly Extremities: extremities normal, atraumatic, no cyanosis or edema Skin: Skin color, texture, turgor normal. No rashes or lesions Lymph nodes: Cervical, supraclavicular, and axillary nodes normal. No abnormal inguinal nodes palpated Neurologic: Grossly normal   Pelvic: External genitalia:  no lesions              Urethra:  normal appearing urethra with no masses, tenderness or lesions              Bartholins and Skenes: normal                 Vagina: normal appearing vagina with normal color and discharge, no lesions              Cervix: no lesions              Pap taken: Yes.   Bimanual Exam:  Uterus:  normal size, contour, position, consistency, mobility, non-tender              Adnexa: normal adnexa and no mass, fullness, tenderness               Rectovaginal: Confirms               Anus:  normal sphincter tone, no lesions  Chaperone, Royal Hawthorn, CMA, was present for exam.  A:  Well Woman with normal exam PMP, no HRT H/o rectal cancer 2018, followed by Dr. Marcello Moores GERD Osteoporosis, stable BMD this year. T cell lymphoma H/o hypertension, off  medication after weight loss H/o anxiety, situational  P:   Mammogram guidelines reviewed pap smear with HR HPV obtained today Restart Paxil 5mg  daily.  Does not need rx yet  as has leftover from prior rx. Colonoscopy due next years Discussed tdap and shingles vaccination.  She will have this done once having current new t cell lymphoma Return annually or prn

## 2020-05-06 ENCOUNTER — Encounter: Payer: Self-pay | Admitting: Obstetrics & Gynecology

## 2020-05-07 ENCOUNTER — Telehealth: Payer: Self-pay

## 2020-05-07 NOTE — Telephone Encounter (Signed)
Update to Dr Sabra Heck.  Pt has Breast US on 05/13/20 and AEX with Dr Sabra Heck on 05/14/20.  Routing to Dr Sabra Heck.  Encounter closed.

## 2020-05-07 NOTE — Telephone Encounter (Signed)
Alexa [name], Virna Livengood [facility] Hi Dr. Sabra Heck - thank you for your phone message last Friday. So sorry I missed your call. I was outside with a neighbor who was helping me load some heavy stuff in my car to take to a Saturday yard sale!  I definitely do not want to look for trouble! I feel like this mammogram finding will not turn into something "bigger" but just was curious about your thoughts. I also don't want to fool myself and be too confident.  As I noted earlier, I did schedule the ultrasound for July 7. And will see you July 8! Still excited about my stable bone density results!  [employer]

## 2020-05-13 ENCOUNTER — Other Ambulatory Visit: Payer: Self-pay | Admitting: Obstetrics & Gynecology

## 2020-05-13 ENCOUNTER — Other Ambulatory Visit: Payer: Self-pay

## 2020-05-13 ENCOUNTER — Ambulatory Visit
Admission: RE | Admit: 2020-05-13 | Discharge: 2020-05-13 | Disposition: A | Discharge status: Home / Self Care | Payer: Medicare Other | Source: Ambulatory Visit | Attending: Obstetrics & Gynecology | Admitting: Obstetrics & Gynecology

## 2020-05-13 DIAGNOSIS — R599 Enlarged lymph nodes, unspecified: Secondary | ICD-10-CM

## 2020-05-13 DIAGNOSIS — N6489 Other specified disorders of breast: Secondary | ICD-10-CM | POA: Diagnosis not present

## 2020-05-13 DIAGNOSIS — R928 Other abnormal and inconclusive findings on diagnostic imaging of breast: Secondary | ICD-10-CM

## 2020-05-14 ENCOUNTER — Other Ambulatory Visit (HOSPITAL_COMMUNITY)
Admission: RE | Admit: 2020-05-14 | Discharge: 2020-05-14 | Disposition: A | Discharge status: Home / Self Care | Payer: Medicare Other | Source: Ambulatory Visit | Attending: Obstetrics & Gynecology | Admitting: Obstetrics & Gynecology

## 2020-05-14 ENCOUNTER — Encounter: Payer: Self-pay | Admitting: Obstetrics & Gynecology

## 2020-05-14 ENCOUNTER — Ambulatory Visit (INDEPENDENT_AMBULATORY_CARE_PROVIDER_SITE_OTHER): Payer: Medicare Other | Admitting: Obstetrics & Gynecology

## 2020-05-14 VITALS — BP 120/78 | HR 68 | Resp 16 | Ht 62.75 in | Wt 128.0 lb

## 2020-05-14 DIAGNOSIS — Z01419 Encounter for gynecological examination (general) (routine) without abnormal findings: Secondary | ICD-10-CM

## 2020-05-14 DIAGNOSIS — Z1151 Encounter for screening for human papillomavirus (HPV): Secondary | ICD-10-CM | POA: Diagnosis not present

## 2020-05-14 DIAGNOSIS — Z124 Encounter for screening for malignant neoplasm of cervix: Secondary | ICD-10-CM

## 2020-05-14 NOTE — Patient Instructions (Signed)
Citracal petite

## 2020-05-15 ENCOUNTER — Other Ambulatory Visit: Payer: Self-pay

## 2020-05-15 ENCOUNTER — Ambulatory Visit
Admission: RE | Admit: 2020-05-15 | Discharge: 2020-05-15 | Disposition: A | Discharge status: Home / Self Care | Payer: Medicare Other | Source: Ambulatory Visit | Attending: Obstetrics & Gynecology | Admitting: Obstetrics & Gynecology

## 2020-05-15 ENCOUNTER — Other Ambulatory Visit (HOSPITAL_COMMUNITY)
Admission: RE | Admit: 2020-05-15 | Discharge: 2020-05-15 | Disposition: A | Discharge status: Home / Self Care | Payer: Medicare Other | Source: Ambulatory Visit | Attending: Diagnostic Radiology | Admitting: Diagnostic Radiology

## 2020-05-15 DIAGNOSIS — R599 Enlarged lymph nodes, unspecified: Secondary | ICD-10-CM | POA: Diagnosis not present

## 2020-05-15 DIAGNOSIS — R590 Localized enlarged lymph nodes: Secondary | ICD-10-CM | POA: Diagnosis present

## 2020-05-15 DIAGNOSIS — D479 Neoplasm of uncertain behavior of lymphoid, hematopoietic and related tissue, unspecified: Secondary | ICD-10-CM | POA: Diagnosis not present

## 2020-05-15 LAB — CYTOLOGY - PAP
Comment: NEGATIVE
Diagnosis: NEGATIVE
High risk HPV: NEGATIVE

## 2020-05-19 LAB — SURGICAL PATHOLOGY

## 2020-05-20 ENCOUNTER — Other Ambulatory Visit: Payer: Medicare Other

## 2020-05-25 DIAGNOSIS — C20 Malignant neoplasm of rectum: Secondary | ICD-10-CM | POA: Diagnosis not present

## 2020-05-25 DIAGNOSIS — Z5111 Encounter for antineoplastic chemotherapy: Secondary | ICD-10-CM | POA: Diagnosis not present

## 2020-05-25 DIAGNOSIS — Z79899 Other long term (current) drug therapy: Secondary | ICD-10-CM | POA: Diagnosis not present

## 2020-05-25 DIAGNOSIS — L299 Pruritus, unspecified: Secondary | ICD-10-CM | POA: Diagnosis not present

## 2020-05-25 DIAGNOSIS — C8400 Mycosis fungoides, unspecified site: Secondary | ICD-10-CM | POA: Diagnosis not present

## 2020-06-15 DIAGNOSIS — C8400 Mycosis fungoides, unspecified site: Secondary | ICD-10-CM | POA: Diagnosis not present

## 2020-06-15 DIAGNOSIS — Z5111 Encounter for antineoplastic chemotherapy: Secondary | ICD-10-CM | POA: Diagnosis not present

## 2020-06-15 DIAGNOSIS — Z8719 Personal history of other diseases of the digestive system: Secondary | ICD-10-CM | POA: Diagnosis not present

## 2020-06-29 DIAGNOSIS — C8590 Non-Hodgkin lymphoma, unspecified, unspecified site: Secondary | ICD-10-CM | POA: Diagnosis not present

## 2020-06-29 DIAGNOSIS — C8400 Mycosis fungoides, unspecified site: Secondary | ICD-10-CM | POA: Diagnosis not present

## 2020-06-29 DIAGNOSIS — J0100 Acute maxillary sinusitis, unspecified: Secondary | ICD-10-CM | POA: Diagnosis not present

## 2020-06-29 DIAGNOSIS — C19 Malignant neoplasm of rectosigmoid junction: Secondary | ICD-10-CM | POA: Diagnosis not present

## 2020-06-29 DIAGNOSIS — R030 Elevated blood-pressure reading, without diagnosis of hypertension: Secondary | ICD-10-CM | POA: Diagnosis not present

## 2020-07-02 DIAGNOSIS — C8400 Mycosis fungoides, unspecified site: Secondary | ICD-10-CM | POA: Diagnosis not present

## 2020-07-09 DIAGNOSIS — Z79899 Other long term (current) drug therapy: Secondary | ICD-10-CM | POA: Diagnosis not present

## 2020-07-09 DIAGNOSIS — Z8719 Personal history of other diseases of the digestive system: Secondary | ICD-10-CM | POA: Diagnosis not present

## 2020-07-09 DIAGNOSIS — Z5111 Encounter for antineoplastic chemotherapy: Secondary | ICD-10-CM | POA: Diagnosis not present

## 2020-07-09 DIAGNOSIS — Z85048 Personal history of other malignant neoplasm of rectum, rectosigmoid junction, and anus: Secondary | ICD-10-CM | POA: Diagnosis not present

## 2020-07-09 DIAGNOSIS — C8400 Mycosis fungoides, unspecified site: Secondary | ICD-10-CM | POA: Diagnosis not present

## 2020-07-14 DIAGNOSIS — R030 Elevated blood-pressure reading, without diagnosis of hypertension: Secondary | ICD-10-CM | POA: Diagnosis not present

## 2020-07-14 DIAGNOSIS — C19 Malignant neoplasm of rectosigmoid junction: Secondary | ICD-10-CM | POA: Diagnosis not present

## 2020-07-14 DIAGNOSIS — C8400 Mycosis fungoides, unspecified site: Secondary | ICD-10-CM | POA: Diagnosis not present

## 2020-07-14 DIAGNOSIS — C8590 Non-Hodgkin lymphoma, unspecified, unspecified site: Secondary | ICD-10-CM | POA: Diagnosis not present

## 2020-08-03 DIAGNOSIS — Z5112 Encounter for antineoplastic immunotherapy: Secondary | ICD-10-CM | POA: Diagnosis not present

## 2020-08-03 DIAGNOSIS — C19 Malignant neoplasm of rectosigmoid junction: Secondary | ICD-10-CM | POA: Diagnosis not present

## 2020-08-03 DIAGNOSIS — Z79899 Other long term (current) drug therapy: Secondary | ICD-10-CM | POA: Diagnosis not present

## 2020-08-03 DIAGNOSIS — C8400 Mycosis fungoides, unspecified site: Secondary | ICD-10-CM | POA: Diagnosis not present

## 2020-08-18 DIAGNOSIS — C8400 Mycosis fungoides, unspecified site: Secondary | ICD-10-CM | POA: Diagnosis not present

## 2020-08-18 DIAGNOSIS — R234 Changes in skin texture: Secondary | ICD-10-CM | POA: Diagnosis not present

## 2020-08-18 DIAGNOSIS — D485 Neoplasm of uncertain behavior of skin: Secondary | ICD-10-CM | POA: Diagnosis not present

## 2020-08-18 DIAGNOSIS — L986 Other infiltrative disorders of the skin and subcutaneous tissue: Secondary | ICD-10-CM | POA: Diagnosis not present

## 2020-08-18 DIAGNOSIS — L83 Acanthosis nigricans: Secondary | ICD-10-CM | POA: Diagnosis not present

## 2020-08-18 DIAGNOSIS — Z85048 Personal history of other malignant neoplasm of rectum, rectosigmoid junction, and anus: Secondary | ICD-10-CM | POA: Diagnosis not present

## 2020-08-24 DIAGNOSIS — C8404 Mycosis fungoides, lymph nodes of axilla and upper limb: Secondary | ICD-10-CM | POA: Diagnosis not present

## 2020-08-24 DIAGNOSIS — C20 Malignant neoplasm of rectum: Secondary | ICD-10-CM | POA: Diagnosis not present

## 2020-08-24 DIAGNOSIS — Z5111 Encounter for antineoplastic chemotherapy: Secondary | ICD-10-CM | POA: Diagnosis not present

## 2020-08-24 DIAGNOSIS — Z79899 Other long term (current) drug therapy: Secondary | ICD-10-CM | POA: Diagnosis not present

## 2020-08-24 DIAGNOSIS — C8400 Mycosis fungoides, unspecified site: Secondary | ICD-10-CM | POA: Diagnosis not present

## 2020-09-14 DIAGNOSIS — C8400 Mycosis fungoides, unspecified site: Secondary | ICD-10-CM | POA: Diagnosis not present

## 2020-09-14 DIAGNOSIS — Z23 Encounter for immunization: Secondary | ICD-10-CM | POA: Diagnosis not present

## 2020-09-14 DIAGNOSIS — R200 Anesthesia of skin: Secondary | ICD-10-CM | POA: Diagnosis not present

## 2020-09-14 DIAGNOSIS — Z8719 Personal history of other diseases of the digestive system: Secondary | ICD-10-CM | POA: Diagnosis not present

## 2020-09-14 DIAGNOSIS — R202 Paresthesia of skin: Secondary | ICD-10-CM | POA: Diagnosis not present

## 2020-09-14 DIAGNOSIS — L539 Erythematous condition, unspecified: Secondary | ICD-10-CM | POA: Diagnosis not present

## 2020-09-14 DIAGNOSIS — Z79899 Other long term (current) drug therapy: Secondary | ICD-10-CM | POA: Diagnosis not present

## 2020-09-14 DIAGNOSIS — C20 Malignant neoplasm of rectum: Secondary | ICD-10-CM | POA: Diagnosis not present

## 2020-09-14 DIAGNOSIS — R21 Rash and other nonspecific skin eruption: Secondary | ICD-10-CM | POA: Diagnosis not present

## 2020-09-14 DIAGNOSIS — Z5111 Encounter for antineoplastic chemotherapy: Secondary | ICD-10-CM | POA: Diagnosis not present

## 2020-09-18 DIAGNOSIS — H52203 Unspecified astigmatism, bilateral: Secondary | ICD-10-CM | POA: Diagnosis not present

## 2020-09-18 DIAGNOSIS — H2513 Age-related nuclear cataract, bilateral: Secondary | ICD-10-CM | POA: Diagnosis not present

## 2020-09-18 DIAGNOSIS — H40013 Open angle with borderline findings, low risk, bilateral: Secondary | ICD-10-CM | POA: Diagnosis not present

## 2020-09-21 DIAGNOSIS — Z85048 Personal history of other malignant neoplasm of rectum, rectosigmoid junction, and anus: Secondary | ICD-10-CM | POA: Diagnosis not present

## 2020-10-02 DIAGNOSIS — R002 Palpitations: Secondary | ICD-10-CM | POA: Diagnosis not present

## 2020-10-05 DIAGNOSIS — H04123 Dry eye syndrome of bilateral lacrimal glands: Secondary | ICD-10-CM | POA: Diagnosis not present

## 2020-10-05 DIAGNOSIS — H5310 Unspecified subjective visual disturbances: Secondary | ICD-10-CM | POA: Diagnosis not present

## 2020-10-05 DIAGNOSIS — H2513 Age-related nuclear cataract, bilateral: Secondary | ICD-10-CM | POA: Diagnosis not present

## 2020-10-05 DIAGNOSIS — H43813 Vitreous degeneration, bilateral: Secondary | ICD-10-CM | POA: Diagnosis not present

## 2020-10-06 NOTE — Progress Notes (Signed)
Primary Physician/Referring:  Chesley Noon, MD  Patient ID: Alexa [name], female    DOB: February 01, 1951, 69 y.o.   MRN: [id number]  Chief Complaint  Patient presents with  . Palpitations  . New Patient (Initial Visit)    Referred by Nicholes Rough, PA-C   HPI:   Alexa [name]  is a 69 y.o. female with history of hypertension, hyperlipidemia, palpitations, mycosis fungoides, T-cell lymphoma, and colorectal cancer.  She was previously seen in our office 03/06/2017 for palpitations and family history of premature coronary artery disease. At this time patient was recommended to follow up with our office on an as needed basis.   Presents to our office for evaluation of more frequent palpitations over the last 1 month. She had 2 episodes of palpitations early this month on her way to her cancer infusion and on the way to a GI appointment. Both episodes felt like her heart was racing lasting 15-30 minutes. Since then starting on 09/28/20 she has had occasional episodes of palpitations 2-3 times daily 2-3 days per week. Episodes last 15-30 minutes and are not associated with dizziness, fatigue, chest pain, dyspnea, nausea, symptoms suggestive of TIA/stroke. She continues to exercise daily without issue. Of note she has stopped taking Crestor since being seen in our office in 2018 as she was concerned about side effects, although she did not personally have issues. Lipids are managed by her PCP. Since her last visit in our office she has also stopped amlodipine due to hypotension associated with her chemotherapy treatments. However over the last several months patient reports her home blood pressure readings have been elevated   Patient reports she started brentuximab infusions in July and had her last infusion November 8.  For her last 2 infusions also taking prednisone with the infusions in order to curb some side effects of the treatment.  She has also had GERD since starting chemotherapy  and is now taking Pepcid on a daily basis. 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 Patient has noticed weakness in her legs bilaterally as well since treatment for cancer. She has made her oncology team aware of this.   Past Medical History:  Diagnosis Date  . Adenomatous polyp   . 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 Family history of anesthesia complication    sister and sister's children postitive for [place]!!!!!!, pt tested negative 3-33yr ago  . GERD (gastroesophageal reflux disease)   . History of hypertension    off medication after weight loss  . Hyperlipemia   . [place]    light box therapy  . 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[place]   . Osteopenia    bilateral hips  . [place]   Past Surgical History:  Procedure Laterality Date  . COLONOSCOPY  5/13   with biopsy  . DILATATION & CURRETTAGE/HYSTEROSCOPY WITH RESECTOCOPE N/A 06/02/2014   Procedure: [place]  Surgeon: MLyman Speller MD;  [facility];  Service: Gynecology;  Laterality: N/A;  . FOOT SURGERY     Right   . TRANSANAL EXCISION OF RECTAL MASS N/A 06/09/2017   Procedure: TRANSANAL EXCISION OF RECTAL MASS;  Surgeon: TLeighton Ruff MD;  [place] [facility]  Service: General;  Laterality: N/A;   Family History  Problem Relation Age of Onset  . Anuerysm Father   . Heart disease Father   . Diabetes Father   .  Cancer Father        unknown type  . Hypertension Father   . Heart attack Mother   . Heart disease Mother   . Kidney disease Mother   . Kidney cancer Mother        contained tumor  . Hypertension Mother   . Heart attack Brother   . Cancer Brother        liver cancer  . Melanoma Brother   . Other Brother        Bypass surgery   . Hypertension Sister   . Ovarian cancer Paternal Grandmother        was PMP  . Colon cancer Neg Hx   Father with MI in his 63s and  CHF in his 16s.  Brother with MI in early 75s.  Social History   Tobacco Use  . Smoking status: Never Smoker  . Smokeless tobacco: Never Used  Substance Use Topics  . Alcohol use: Yes    Alcohol/week: 0.0 - 1.0 standard drinks   Marital Status: Single   ROS  Review of Systems  Constitutional: Negative for malaise/fatigue and weight gain.  Cardiovascular: Positive for palpitations. Negative for chest pain, claudication, leg swelling, near-syncope, orthopnea, paroxysmal nocturnal dyspnea and syncope.  Respiratory: Negative for shortness of breath.   Hematologic/Lymphatic: Does not bruise/bleed easily.  Gastrointestinal: Negative for melena.  Neurological: Negative for dizziness and weakness.    Objective  Blood pressure (!) 142/82, pulse 80, resp. rate 16, height 5' 2"  (1.575 m), weight 128 lb (58.1 kg), last menstrual period 11/07/2002, SpO2 97 %.  Vitals with BMI 10/07/2020 05/14/2020 05/03/2019  Height 5' 2"  5' 2.75" 5' 2.75"  Weight 128 lbs 128 lbs 125 lbs  BMI 23.41 46.96 29.52  Systolic 841 324 401  Diastolic 82 78 68  Pulse 80 68 80     Physical Exam Vitals reviewed.  HENT:     Head: Normocephalic and atraumatic.  Cardiovascular:     Rate and Rhythm: Normal rate and regular rhythm.  No extrasystoles are present.    Pulses: Intact distal pulses.          Carotid pulses are 2+ on the right side and 2+ on the left side.      Radial pulses are 2+ on the right side and 2+ on the left side.       Femoral pulses are 2+ on the right side and 2+ on the left side.      Popliteal pulses are 2+ on the right side and 2+ on the left side.       Dorsalis pedis pulses are 2+ on the right side and 2+ on the left side.       Posterior tibial pulses are 2+ on the right side and 2+ on the left side.     Heart sounds: S1 normal and S2 normal. No murmur heard.  No gallop.   Pulmonary:     Effort: Pulmonary effort is normal. No respiratory distress.     Breath sounds: No wheezing,  rhonchi or rales.  Musculoskeletal:     Right lower leg: No edema.     Left lower leg: No edema.  Neurological:     Mental Status: She is alert.     Laboratory examination:   No results for input(s): NA, K, CL, CO2, GLUCOSE, BUN, CREATININE, CALCIUM, GFRNONAA, GFRAA in the last 8760 hours. CrCl cannot be calculated (No successful lab value found.).  CMP Latest Ref Rng & Units 06/09/2017  Glucose 65 - 99 mg/dL 101(H)  Sodium 135 - 145 mmol/L 144  Potassium 3.5 - 5.1 mmol/L 3.8   CBC Latest Ref Rng & Units 06/09/2017 05/28/2014 09/24/2013  WBC 4.0 - 10.5 K/uL - 4.6 -  Hemoglobin 12.0 - 15.0 g/dL 13.9 14.4 14.4  Hematocrit 36 - 46 % 41.0 41.0 -  Platelets 150 - 400 K/uL - 196 -    Lipid Panel No results for input(s): CHOL, TRIG, LDLCALC, VLDL, HDL, CHOLHDL, LDLDIRECT in the last 8760 hours.  HEMOGLOBIN A1C No results found for: HGBA1C, MPG TSH No results for input(s): TSH in the last 8760 hours.  External labs:   09/14/2020: Sodium 138, potassium 4.2, creatinine 1.0, BUN 20, EGFR 58 Hemoglobin 13.9, hematocrit 40.3, platelets 257 TSH 2.39   Medications and allergies   Allergies  Allergen Reactions  . Sudafed [Pseudoephedrine Hcl] Palpitations     Outpatient Medications Prior to Visit  Medication Sig Dispense Refill  . brentuximab vedotin 1.8 mg/kg in sodium chloride 0.9 % 100 mL Inject 1.8 mg/kg into the vein once.    . cetirizine (ZYRTEC) 10 MG tablet Take 10 mg by mouth daily.    Marland Kitchen Cod Liver Oil 1000 MG CAPS Take by mouth.    . ELDERBERRY PO Take by mouth daily.    . famotidine (PEPCID) 20 MG tablet Take 20 mg by mouth 2 (two) times daily.    Marland Kitchen triamcinolone cream (KENALOG) 0.1 %     . UNABLE TO FIND Wheat grass    . folic acid (FOLVITE) 607 MCG tablet Take by mouth daily.    . interferon alfa-2b (INTRON A) 6000000 UNIT/ML injection Inject 0.5 cc (3 Mu) three times a week subcutaneously    . methotrexate (RHEUMATREX) 2.5 MG tablet Take 6 tablets by mouth once a  week.    Marland Kitchen PARoxetine (PAXIL) 10 MG tablet Take by mouth.      No facility-administered medications prior to visit.     Radiology:   No results found.  Cardiac Studies:   Echocardiogram 02/09/2017: 1.  Left ventricle cavity is normal in size.  Normal global wall motion.  Normal diastolic filling pattern.  Normal LAP.  Calculated EF 50% 2.  Trace mitral regurgitation. 3.  Trace tricuspid regurgitation.  No evidence of pulmonary hypertension  Nuclear stress test 02/13/2017: Resting EKG demonstrates normal sinus rhythm.  The patient exercised according to Bruce protocol, total time recorded 9 minutes achieving maximum heart rate of 169 which was 107% of target heart rate for age and 10.16 METS of work.  Stress terminated due to fatigue, THR met.  Hypertensive blood pressure response.  There was no ST-T changes of ischemia with exercise stress test.  There were no significant arrhythmias.  Normal blood pressure response. No evidence of ischemia by GXT.  Exercise tolerance is normal.  Hypertensive blood pressure response.  Continue preventive therapy.  Bilateral carotid artery duplex 01/30/2017: Minimal amount of bilateral intimal thickening and atherosclerotic plaque, right greater than left, not resulting in a hemodynamically significant stenosis within either internal carotid artery.  EKG:   10/07/2020: Sinus rhythm at a rate of 62 bpm, normal axis, poor R wave progression, cannot exclude anteroseptal infarct old.  No evidence of ischemia.  Compared to EKG 11/27/2015, no significant change.  Assessment     ICD-10-CM   1. Palpitations  R00.2 EKG 12-Lead    LONG TERM MONITOR (3-14 DAYS)  2. Hypertension, essential  I10   3. Other hyperlipidemia  E78.49  Medications Discontinued During This Encounter  Medication Reason  . folic acid (FOLVITE) 893 MCG tablet Patient Preference  . interferon alfa-2b (INTRON A) 6000000 UNIT/ML injection No longer needed (for PRN medications)  .  methotrexate (RHEUMATREX) 2.5 MG tablet Change in therapy  . PARoxetine (PAXIL) 10 MG tablet Change in therapy    Meds ordered this encounter  Medications  . amLODipine (NORVASC) 5 MG tablet    Sig: Take 1 tablet (5 mg total) by mouth daily.    Dispense:  90 tablet    Refill:  3    Recommendations:   Alexa [name] is a 69 y.o. female with history of hypertension, hyperlipidemia, palpitations, mycosis fungoides, T-cell lymphoma, and colorectal cancer.  She was previously seen in our office 03/06/2017 for palpitations and family history of premature coronary artery disease.  Patient is referred back to our office by her primary care provider at this time for complaints of more frequent palpitations over the last 1 month.  Patient has a history of PVCs and PACs for which she has been evaluated in our office previously in 2018.  Current symptoms of palpitations may be related to ongoing PVCs and PACs, however in view of increased frequency and duration will obtain 2-week cardiac monitoring to further evaluate. EKG today revealed normal sinus rhythm and no evidence of ischemia.   In regard to hypertension, patient's blood pressure remains elevated above goal.  Will resume amlodipine 5 mg daily at this time.  Encourage patient to monitor her blood pressure on a daily basis and to keep a log to bring with her at her follow-up appointment.  Discussed side effects of the medication as well as signs and symptoms that would warrant sooner evaluation.  Patient verbalized understanding and agreement.  Patient does have a history of hypercholesterolemia, however there is no recent lipid profile testing available for review.  Patient states she has an upcoming appointment with her PCP at which time she will have blood work done.  Will defer further management of lipids to her primary care provider.  However did discuss with her regarding benefits of statin therapy, however patient is hesitant to initiate  statin therapy due to concerns of side effects. Encouraged patient to continue with healthy diet and active lifestyle. Of note patient appears anxious about her health and current symptoms, I have provided reassurance today.   Follow up in 6 weeks for hypertension and palpitations.   During this visit I reviewed and updated: Tobacco history  allergies medication reconciliation  medical history  surgical history  family history  social history.  This note was created using a voice recognition software as a result there may be grammatical errors inadvertently enclosed that do not reflect the nature of this encounter. Every attempt is made to correct such errors.   Alethia Berthold, PA-C 10/07/2020, 4:26 PM Office: [phone]

## 2020-10-07 ENCOUNTER — Ambulatory Visit: Payer: Medicare Other | Admitting: Student

## 2020-10-07 ENCOUNTER — Encounter: Payer: Self-pay | Admitting: Student

## 2020-10-07 ENCOUNTER — Other Ambulatory Visit: Payer: Self-pay

## 2020-10-07 ENCOUNTER — Ambulatory Visit: Payer: Medicare Other

## 2020-10-07 VITALS — BP 142/82 | HR 80 | Resp 16 | Ht 62.0 in | Wt 128.0 lb

## 2020-10-07 DIAGNOSIS — E7849 Other hyperlipidemia: Secondary | ICD-10-CM | POA: Diagnosis not present

## 2020-10-07 DIAGNOSIS — I10 Essential (primary) hypertension: Secondary | ICD-10-CM | POA: Diagnosis not present

## 2020-10-07 DIAGNOSIS — R002 Palpitations: Secondary | ICD-10-CM | POA: Diagnosis not present

## 2020-10-07 MED ORDER — AMLODIPINE BESYLATE 5 MG PO TABS
5.0000 mg | ORAL_TABLET | Freq: Every day | ORAL | 3 refills | Status: DC
Start: 2020-10-07 — End: 2020-10-19

## 2020-10-09 ENCOUNTER — Telehealth: Payer: Self-pay

## 2020-10-09 NOTE — Telephone Encounter (Signed)
Patient called to say that her BP Is now running 125/85 after 2 doses of amlodipine. palpitations are better also. She thinks that this is to low, she wanted your thoughts and what she should expect.

## 2020-10-09 NOTE — Telephone Encounter (Signed)
That blood pressure looks great, keep taking the amlodipine. It can also help some with palpitations so I am not surprised that she feels they are better. Please let the office know if she experiences dizziness, worsening fatigue, or shortness of breath, those may be signs her blood pressure is too low.

## 2020-10-14 DIAGNOSIS — C8400 Mycosis fungoides, unspecified site: Secondary | ICD-10-CM | POA: Diagnosis not present

## 2020-10-14 DIAGNOSIS — R718 Other abnormality of red blood cells: Secondary | ICD-10-CM | POA: Diagnosis not present

## 2020-10-14 DIAGNOSIS — D72829 Elevated white blood cell count, unspecified: Secondary | ICD-10-CM | POA: Diagnosis not present

## 2020-10-14 DIAGNOSIS — D696 Thrombocytopenia, unspecified: Secondary | ICD-10-CM | POA: Diagnosis not present

## 2020-10-19 ENCOUNTER — Telehealth: Payer: Self-pay | Admitting: Student

## 2020-10-19 DIAGNOSIS — I10 Essential (primary) hypertension: Secondary | ICD-10-CM | POA: Diagnosis not present

## 2020-10-19 DIAGNOSIS — R002 Palpitations: Secondary | ICD-10-CM

## 2020-10-19 MED ORDER — AMLODIPINE BESYLATE 5 MG PO TABS: 10.0000 mg | ORAL_TABLET | Freq: Every day | ORAL | 3 refills | Status: DC

## 2020-10-19 NOTE — Telephone Encounter (Signed)
Patient sent a message on MyChart requesting a call to discuss blood pressure. Patient reports home blood pressure readings are still elevated above goal averaging 140/85 per patient. She also continues to have daily episodes of palpitations. Will increased amlodipine from 5 mg to 10 mg daily. Patient will continue to monitor her blood pressure daily. Patient is also presently wearing a cardiac monitor, will continue this for 3 more days. Reassured patient she can travel to [place] this weekend for a graduation ceremony. Also reassured her that she can get her Covid-19 booster shot.  1. Hypertension, essential - amLODipine (NORVASC) 5 MG tablet; Take 2 tablets (10 mg total) by mouth daily.  Dispense: 90 tablet; Refill: 3  2. Palpitations - amLODipine (NORVASC) 5 MG tablet; Take 2 tablets (10 mg total) by mouth daily.  Dispense: 90 tablet; Refill: [place], PA-C 10/19/2020, 9:19 AM Office: [phone]

## 2020-10-20 DIAGNOSIS — E785 Hyperlipidemia, unspecified: Secondary | ICD-10-CM | POA: Diagnosis not present

## 2020-10-20 DIAGNOSIS — C19 Malignant neoplasm of rectosigmoid junction: Secondary | ICD-10-CM | POA: Diagnosis not present

## 2020-10-20 DIAGNOSIS — R002 Palpitations: Secondary | ICD-10-CM | POA: Diagnosis not present

## 2020-10-20 DIAGNOSIS — Z0000 Encounter for general adult medical examination without abnormal findings: Secondary | ICD-10-CM | POA: Diagnosis not present

## 2020-10-20 DIAGNOSIS — F418 Other specified anxiety disorders: Secondary | ICD-10-CM | POA: Diagnosis not present

## 2020-10-20 DIAGNOSIS — C8590 Non-Hodgkin lymphoma, unspecified, unspecified site: Secondary | ICD-10-CM | POA: Diagnosis not present

## 2020-10-21 ENCOUNTER — Telehealth: Payer: Self-pay

## 2020-10-21 DIAGNOSIS — Z23 Encounter for immunization: Secondary | ICD-10-CM | POA: Diagnosis not present

## 2020-10-21 DIAGNOSIS — C8400 Mycosis fungoides, unspecified site: Secondary | ICD-10-CM | POA: Diagnosis not present

## 2020-10-21 DIAGNOSIS — C19 Malignant neoplasm of rectosigmoid junction: Secondary | ICD-10-CM | POA: Diagnosis not present

## 2020-10-21 NOTE — Telephone Encounter (Signed)
I attempted to call the patient however she did not answer.  Please inform patient I do not have the results of her monitor yet and therefore will not have more information regarding her palpitations at this time.  However if patient really prefers to be seen tomorrow I have a 10:00 appointment available.  If she chooses to schedule appointment please have EKG done. Thank you

## 2020-10-21 NOTE — Telephone Encounter (Signed)
Patient left a vm she is feeling anxious and still is having palp and some sensation on her left breast patient would like to be seen preferably tomorrow if you are able. Best number to contact pt [phone] please advise

## 2020-10-22 ENCOUNTER — Ambulatory Visit: Payer: Self-pay | Admitting: Student

## 2020-10-22 ENCOUNTER — Ambulatory Visit: Payer: Medicare Other | Admitting: Student

## 2020-10-22 ENCOUNTER — Other Ambulatory Visit: Payer: Self-pay

## 2020-10-22 ENCOUNTER — Encounter: Payer: Self-pay | Admitting: Student

## 2020-10-22 VITALS — BP 128/68 | HR 84 | Resp 16 | Ht 62.0 in | Wt 127.0 lb

## 2020-10-22 DIAGNOSIS — I10 Essential (primary) hypertension: Secondary | ICD-10-CM | POA: Diagnosis not present

## 2020-10-22 DIAGNOSIS — R002 Palpitations: Secondary | ICD-10-CM | POA: Diagnosis not present

## 2020-10-22 MED ORDER — METOPROLOL TARTRATE 25 MG PO TABS: 25.0000 mg | ORAL_TABLET | Freq: Two times a day (BID) | ORAL | 3 refills | Status: DC

## 2020-10-22 NOTE — Telephone Encounter (Signed)
Spoke to patient she is coming in today @ 10

## 2020-10-22 NOTE — Telephone Encounter (Signed)
Patient didn't answer left a vm will try again later

## 2020-10-22 NOTE — Progress Notes (Signed)
Primary Physician/Referring:  Chesley Noon, MD  Patient ID: Alexa [name], female    DOB: 02/01/1951, 69 y.o.   MRN: [id number]  Chief Complaint  Patient presents with  . Palpitations  . Follow-up   HPI:   Alexa [name]  is a 69 y.o. female with history of hypertension, hyperlipidemia, palpitations, mycosis fungoides, T-cell lymphoma, and colorectal cancer.  She was previously seen in our office 03/06/2017 for palpitations and family history of premature coronary artery disease.  Patient presented to our office 10/07/2020 for reevaluation of palpitations.   Patient presents today for urgent visit at her request with complaint of continued palpitations.  At last visit patient was started on amlodipine which significantly improved frequency and duration of palpitations for about 10 days.  However over the last 2-3 days patient has had several episodes of palpitations throughout the day.  Denies dizziness, fatigue, dyspnea, nausea, chest pain.  She has continued to exercise without issue, and in fact palpitations seem to improve while she is exerting herself.  Patient is extremely anxious regarding palpitations, and concerned as her mother died of a heart attack in her 83s.  Patient is tolerating amlodipine 10 mg daily well without side effects and home blood pressure readings are well controlled.  Patient notes that she has been on metoprolol in the past for palpitations, and it seemed to work well.  Past Medical History:  Diagnosis Date  . Adenomatous polyp   . 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 Family history of anesthesia complication    sister and sister's children postitive for [place]!!!!!!, pt tested negative 3-2yr ago  . GERD (gastroesophageal reflux disease)   . History of hypertension    off medication after weight loss  . Hyperlipemia   . [place]    light box therapy  . 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 [place]   . Osteopenia     bilateral hips  . Rectal cancer ([facility] 04[place]   Past Surgical History:  Procedure Laterality Date  . COLONOSCOPY  5/13   with biopsy  . DILATATION & CURRETTAGE/HYSTEROSCOPY WITH RESECTOCOPE N/A 06/02/2014   Procedure: [place]  Surgeon: MLyman Speller MD;  [facility];  Service: Gynecology;  Laterality: N/A;  . FOOT SURGERY     Right   . TRANSANAL EXCISION OF RECTAL MASS N/A 06/09/2017   Procedure: TRANSANAL EXCISION OF RECTAL MASS;  Surgeon: TLeighton Ruff MD;  [facility]  Service: General;  Laterality: N/A;   Family History  Problem Relation Age of Onset  . Anuerysm Father   . Heart disease Father   . Diabetes Father   . Cancer Father        unknown type  . Hypertension Father   . Heart attack Mother   . Heart disease Mother   . Kidney disease Mother   . Kidney cancer Mother        contained tumor  . Hypertension Mother   . Heart attack Brother   . Cancer Brother        liver cancer  . Melanoma Brother   . Other Brother        Bypass surgery   . Hypertension Sister   . Ovarian cancer Paternal Grandmother        was PMP  . Colon cancer Neg Hx   Father with MI in his 510sand CHF in his 671s  Brother with MI  in early 15s.  Social History   Tobacco Use  . Smoking status: Never Smoker  . Smokeless tobacco: Never Used  Substance Use Topics  . Alcohol use: Yes    Alcohol/week: 0.0 - 1.0 standard drinks   Marital Status: Single   ROS  Review of Systems  Constitutional: Negative for malaise/fatigue and weight gain.  Cardiovascular: Positive for palpitations. Negative for chest pain, claudication, leg swelling, near-syncope, orthopnea, paroxysmal nocturnal dyspnea and syncope.  Respiratory: Negative for shortness of breath.   Hematologic/Lymphatic: Does not bruise/bleed easily.  Gastrointestinal: Negative for melena.  Neurological: Negative for dizziness and weakness.    Objective   Blood pressure 128/68, pulse 84, resp. rate 16, height 5' 2"  (1.575 m), weight 127 lb (57.6 kg), last menstrual period 11/07/2002, SpO2 97 %.  Vitals with BMI 10/22/2020 10/07/2020 05/14/2020  Height 5' 2"  5' 2"  5' 2.75"  Weight 127 lbs 128 lbs 128 lbs  BMI 23.22 03.55 97.41  Systolic 638 453 646  Diastolic 68 82 78  Pulse 84 80 68     Physical Exam Vitals reviewed.  HENT:     Head: Normocephalic and atraumatic.  Cardiovascular:     Rate and Rhythm: Normal rate and regular rhythm.  No extrasystoles are present.    Pulses: Intact distal pulses.          Carotid pulses are 2+ on the right side and 2+ on the left side.      Radial pulses are 2+ on the right side and 2+ on the left side.       Femoral pulses are 2+ on the right side and 2+ on the left side.      Popliteal pulses are 2+ on the right side and 2+ on the left side.       Dorsalis pedis pulses are 2+ on the right side and 2+ on the left side.       Posterior tibial pulses are 2+ on the right side and 2+ on the left side.     Heart sounds: S1 normal and S2 normal. No murmur heard. No gallop.   Pulmonary:     Effort: Pulmonary effort is normal. No respiratory distress.     Breath sounds: No wheezing, rhonchi or rales.  Musculoskeletal:     Right lower leg: No edema.     Left lower leg: No edema.  Skin:    General: Skin is warm and dry.  Neurological:     Mental Status: She is alert.     Laboratory examination:   No results for input(s): NA, K, CL, CO2, GLUCOSE, BUN, CREATININE, CALCIUM, GFRNONAA, GFRAA in the last 8760 hours. CrCl cannot be calculated (No successful lab value found.).  CMP Latest Ref Rng & Units 06/09/2017  Glucose 65 - 99 mg/dL 101(H)  Sodium 135 - 145 mmol/L 144  Potassium 3.5 - 5.1 mmol/L 3.8   CBC Latest Ref Rng & Units 06/09/2017 05/28/2014 09/24/2013  WBC 4.0 - 10.5 K/uL - 4.6 -  Hemoglobin 12.0 - 15.0 g/dL 13.9 14.4 14.4  Hematocrit 36.0 - 46.0 % 41.0 41.0 -  Platelets 150 - 400 K/uL - 196 -     Lipid Panel No results for input(s): CHOL, TRIG, LDLCALC, VLDL, HDL, CHOLHDL, LDLDIRECT in the last 8760 hours.  HEMOGLOBIN A1C No results found for: HGBA1C, MPG TSH No results for input(s): TSH in the last 8760 hours.  External labs:   09/14/2020: Sodium 138, potassium 4.2, creatinine 1.0, BUN 20, EGFR 58  Hemoglobin 13.9, hematocrit 40.3, platelets 257 TSH 2.39   Medications and allergies   Allergies  Allergen Reactions  . Sudafed [Pseudoephedrine Hcl] Palpitations     Outpatient Medications Prior to Visit  Medication Sig Dispense Refill  . amLODipine (NORVASC) 5 MG tablet Take 2 tablets (10 mg total) by mouth daily. 90 tablet 3  . B Complex Vitamins (VITAMIN B COMPLEX) TABS Take by mouth. 5000 UT    . cetirizine (ZYRTEC) 10 MG tablet Take 10 mg by mouth daily.    Marland Kitchen Cod Liver Oil 1000 MG CAPS Take by mouth.    . ELDERBERRY PO Take by mouth daily.    . famotidine (PEPCID) 20 MG tablet Take 20 mg by mouth 2 (two) times daily.    Marland Kitchen triamcinolone cream (KENALOG) 0.1 %     . UNABLE TO FIND Wheat grass    . brentuximab vedotin 1.8 mg/kg in sodium chloride 0.9 % 100 mL Inject 1.8 mg/kg into the vein once.     No facility-administered medications prior to visit.     Radiology:   No results found.  Cardiac Studies:   Echocardiogram 02/09/2017: 1.  Left ventricle cavity is normal in size.  Normal global wall motion.  Normal diastolic filling pattern.  Normal LAP.  Calculated EF 50% 2.  Trace mitral regurgitation. 3.  Trace tricuspid regurgitation.  No evidence of pulmonary hypertension  Nuclear stress test 02/13/2017: Resting EKG demonstrates normal sinus rhythm.  The patient exercised according to Bruce protocol, total time recorded 9 minutes achieving maximum heart rate of 169 which was 107% of target heart rate for age and 10.16 METS of work.  Stress terminated due to fatigue, THR met.  Hypertensive blood pressure response.  There was no ST-T changes of ischemia with  exercise stress test.  There were no significant arrhythmias.  Normal blood pressure response. No evidence of ischemia by GXT.  Exercise tolerance is normal.  Hypertensive blood pressure response.  Continue preventive therapy.  Bilateral carotid artery duplex 01/30/2017: Minimal amount of bilateral intimal thickening and atherosclerotic plaque, right greater than left, not resulting in a hemodynamically significant stenosis within either internal carotid artery.  EKG:   EKG 10/22/2020: Sinus rhythm at a rate of 66 bpm.  Normal axis.  Poor progression, cannot exclude anteroseptal infarct age indeterminate.  Compared to EKG 10/07/2020, no significant change.  Assessment     ICD-10-CM   1. Palpitations  R00.2 EKG 12-Lead    metoprolol tartrate (LOPRESSOR) 25 MG tablet  2. Hypertension, essential  I10      Medications Discontinued During This Encounter  Medication Reason  . brentuximab vedotin 1.8 mg/kg in sodium chloride 0.9 % 100 mL Change in therapy    Meds ordered this encounter  Medications  . metoprolol tartrate (LOPRESSOR) 25 MG tablet    Sig: Take 1 tablet (25 mg total) by mouth 2 (two) times daily.    Dispense:  60 tablet    Refill:  3    Recommendations:   Lilybelle Mayeda Milliron is a 69 y.o. female with history of hypertension, hyperlipidemia, palpitations, mycosis fungoides, T-cell lymphoma, and colorectal cancer.  She was previously seen in our office 03/06/2017 for palpitations and family history of premature coronary artery disease.  She presented to reestablish care 10/07/2020, again for evaluation of palpitations.  Patient presents for urgent follow-up with complaints of continued palpitations.  Patient is very anxious as she has been having more frequent episodes of palpitations over the last 1-2 days.  Symptoms  are highly suggestive of ongoing PVCs and PACs, despite amlodipine 10 mg daily.  Patient recently wore a 2-week cardiac monitor, we are awaiting results.  In view  of continued palpitations we will add metoprolol titrate 25 mg twice daily.  Educated patient regarding side effects that would warrant notifying our office.  Patient's blood pressure is presently well controlled.  Patient also states she was recently seen by her primary care provider who prescribed Zoloft in view of ongoing anxiety.  Recommend patient try taking Zoloft as directed by her PCP.  Provided reassurance to patient today that EKG is unchanged from previous and that symptoms are highly suggestive of PVCs and PACs.  Her that we will treat her empirically for these, and that ideally amlodipine and metoprolol will improve palpitation symptoms.  Encourage patient to continue to exercise as she feels her symptoms improve with exertion.  Follow-up in 1 month as previously scheduled for hypertension and palpitations.    Alethia Berthold, PA-C 10/22/2020, 12:23 PM Office: [phone]

## 2020-10-26 ENCOUNTER — Telehealth: Payer: Self-pay

## 2020-10-26 NOTE — Telephone Encounter (Signed)
I don't think it was recent I think it was Friday and I was out of the office sorry for letting you know too late

## 2020-10-26 NOTE — Telephone Encounter (Signed)
No worries at all! Thank you!

## 2020-10-26 NOTE — Telephone Encounter (Signed)
Patient left a vm regarding the medication she was started on metoprolol that her bp was 113/72 and she also wanted to mention she has been feeling a spasm on her left back side please advise

## 2020-10-26 NOTE — Telephone Encounter (Signed)
She sent me a mychart message last night about this and I have replied to her. Was the message more recent?

## 2020-10-27 DIAGNOSIS — R002 Palpitations: Secondary | ICD-10-CM | POA: Diagnosis not present

## 2020-10-27 NOTE — Telephone Encounter (Signed)
error 

## 2020-10-28 ENCOUNTER — Telehealth: Payer: Self-pay | Admitting: Student

## 2020-10-28 DIAGNOSIS — H43813 Vitreous degeneration, bilateral: Secondary | ICD-10-CM | POA: Diagnosis not present

## 2020-10-28 DIAGNOSIS — E785 Hyperlipidemia, unspecified: Secondary | ICD-10-CM | POA: Diagnosis not present

## 2020-10-28 DIAGNOSIS — C8590 Non-Hodgkin lymphoma, unspecified, unspecified site: Secondary | ICD-10-CM | POA: Diagnosis not present

## 2020-10-28 NOTE — Progress Notes (Signed)
Discussed results with patient

## 2020-10-28 NOTE — Telephone Encounter (Signed)
Discussed results of patient's cardiac event monitor with her. She verbalized understanding.

## 2020-11-05 ENCOUNTER — Telehealth: Payer: Self-pay

## 2020-11-05 DIAGNOSIS — R002 Palpitations: Secondary | ICD-10-CM

## 2020-11-05 DIAGNOSIS — I10 Essential (primary) hypertension: Secondary | ICD-10-CM

## 2020-11-05 MED ORDER — METOPROLOL TARTRATE 25 MG PO TABS: 12.5000 mg | ORAL_TABLET | Freq: Two times a day (BID) | ORAL | 3 refills | Status: DC

## 2020-11-05 MED ORDER — AMLODIPINE BESYLATE 5 MG PO TABS: 5.0000 mg | ORAL_TABLET | Freq: Every day | ORAL | 3 refills | Status: DC

## 2020-11-05 NOTE — Telephone Encounter (Signed)
Patient left a vm that her bp has been low last time she checked it it was 93/58 she wanted to speak to you regarding it and if there is anything she can do different please advise

## 2020-11-05 NOTE — Telephone Encounter (Signed)
Patient called to notify the office that she has been having home blood pressure readings that are as low as 93/50 mmHg.  She reports increased fatigue as well.  Due to fatigue and low blood pressure patient had taken upon herself to reduce amlodipine from 10 mg to 5 mg daily, but continued to have soft blood pressures and fatigue.  Advised her to reduce metoprolol titrate from 25 mg to 12.5 mg twice daily.  She will continue taking amlodipine 5 mg and metoprolol tartrate 12.5 mg twice daily for 1 week.  If patient's blood pressure increases to above goal she will increase amlodipine to 10 mg daily.  She verbalized understanding of the plan.

## 2020-11-11 ENCOUNTER — Telehealth: Payer: Self-pay | Admitting: Hematology

## 2020-11-11 NOTE — Telephone Encounter (Signed)
Our office received a new pt referral from [facility] for Mycosis fungoides. Alexa [name] has been cld and scheduled to see Dr. Candise Che for ongoing care on 1/13 at 11am. Pt aware to arrive 20 minutes early.

## 2020-11-14 ENCOUNTER — Other Ambulatory Visit: Payer: Medicare Other

## 2020-11-14 DIAGNOSIS — Z20822 Contact with and (suspected) exposure to covid-19: Secondary | ICD-10-CM

## 2020-11-16 ENCOUNTER — Telehealth: Payer: Self-pay | Admitting: Hematology

## 2020-11-16 NOTE — Telephone Encounter (Signed)
Pt cld to r/s her new pt appt w/Dr. Irene Limbo to 1/18 at 11am.

## 2020-11-17 LAB — NOVEL CORONAVIRUS, NAA: SARS-CoV-2, NAA: NOT DETECTED

## 2020-11-18 ENCOUNTER — Ambulatory Visit: Payer: 59 | Admitting: Student

## 2020-11-19 ENCOUNTER — Ambulatory Visit: Payer: Medicare Other | Admitting: Hematology

## 2020-11-20 ENCOUNTER — Ambulatory Visit: Payer: 59 | Admitting: Student

## 2020-11-23 NOTE — Progress Notes (Signed)
Primary Physician/Referring:  Chesley Noon, MD  Patient ID: Alexa [name], female    DOB: 1951-05-30, 70 y.o.   MRN: [id number]  Chief Complaint  Patient presents with  . Palpitations  . Hypertension  . Follow-up    6 week  . Results    monitor   HPI:   Alexa [name]  is a 70 y.o. female with history of hypertension, hyperlipidemia, palpitations, mycosis fungoides, T-cell lymphoma, and colorectal cancer.  She was previously seen in our office 03/06/2017 for palpitations and family history of premature coronary artery disease.  Patient presented to our office 10/07/2020 for reevaluation of palpitations.   Patient presents for 1 month follow up of hypertension and palpitations. Cardiac event monitor revealed symptomatic supraventricular tachycardia and PVCs. Since last visit patient experienced fatigue and low blood pressure readings, therefore patient advised to take amlodipine 5 mg and metoprolol tartrate 12.5 mg twice.  However patient was experiencing fatigue, and therefore reduce metoprolol to 12.5 mg once daily.  She reports symptoms are well controlled, with only occasional brief palpitations.  Patient continues to exercise regularly without issue.  She brings with her written log of home blood pressure readings which are under excellent control.  Of note patient has recently started new infusion treatment with oncology.  Overall patient is feeling well and palpitation symptoms have essentially resolved.  Past Medical History:  Diagnosis Date  . Adenomatous polyp   . 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 Family history of anesthesia complication    sister and sister's children postitive for [place]!!!!!!, pt tested negative 3-65yr ago  . GERD (gastroesophageal reflux disease)   . History of hypertension    off medication after weight loss  . Hyperlipemia   . [place]    light box therapy  . 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   .  Osteopenia    bilateral hips  . [place]al cancer ([place] 04[place]   Past Surgical History:  Procedure Laterality Date  . COLONOSCOPY  5/13   with biopsy  . DILATATION & CURRETTAGE/HYSTEROSCOPY WITH RESECTOCOPE N/A 06/02/2014   Procedure: [place]  Surgeon: MLyman Speller MD;  [facility];  Service: Gynecology;  Laterality: N/A;  . FOOT SURGERY     Right   . TRANSANAL EXCISION OF RECTAL MASS N/A 06/09/2017   Procedure: TRANSANAL EXCISION OF RECTAL MASS;  Surgeon: TLeighton Ruff MD;  [place]  Service: General;  Laterality: N/A;   Family History  Problem Relation Age of Onset  . Anuerysm Father   . Heart disease Father   . Diabetes Father   . Cancer Father        unknown type  . Hypertension Father   . Heart attack Mother   . Heart disease Mother   . Kidney disease Mother   . Kidney cancer Mother        contained tumor  . Hypertension Mother   . Heart attack Brother   . Cancer Brother        liver cancer  . Melanoma Brother   . Other Brother        Bypass surgery   . Hypertension Sister   . Ovarian cancer Paternal Grandmother        was PMP  . Colon cancer Neg Hx   Father with MI in his 51sand CHF in his 666s  Brother with MI in early 642s  Social History   Tobacco Use  . Smoking status: Never Smoker  . Smokeless tobacco: Never Used  Substance Use Topics  . Alcohol use: Yes    Alcohol/week: 0.0 - 1.0 standard drinks   Marital Status: Single   ROS  Review of Systems  Constitutional: Negative for malaise/fatigue and weight gain.  Cardiovascular: Positive for palpitations (significantly improved, only occasional and brief). Negative for chest pain, claudication, leg swelling, near-syncope, orthopnea, paroxysmal nocturnal dyspnea and syncope.  Respiratory: Negative for shortness of breath.   Hematologic/Lymphatic: Does not bruise/bleed easily.  Gastrointestinal: Negative for melena.   Neurological: Negative for dizziness and weakness.    Objective  Blood pressure (!) 141/79, pulse 66, temperature 98.3 F (36.8 C), temperature source Temporal, resp. rate 16, height 5' 2"  (1.575 m), weight 126 lb (57.2 kg), last menstrual period 11/07/2002, SpO2 98 %.  Vitals with BMI 11/26/2020 11/24/2020 10/22/2020  Height 5' 2"  5' 2"  5' 2"   Weight 126 lbs 128 lbs 11 oz 127 lbs  BMI 23.04 97.02 63.78  Systolic 588 502 774  Diastolic 79 77 68  Pulse 66 72 84     Physical Exam Vitals reviewed.  HENT:     Head: Normocephalic and atraumatic.  Cardiovascular:     Rate and Rhythm: Normal rate and regular rhythm.  No extrasystoles are present.    Pulses: Intact distal pulses.          Carotid pulses are 2+ on the right side and 2+ on the left side.      Radial pulses are 2+ on the right side and 2+ on the left side.       Femoral pulses are 2+ on the right side and 2+ on the left side.      Popliteal pulses are 2+ on the right side and 2+ on the left side.       Dorsalis pedis pulses are 2+ on the right side and 2+ on the left side.       Posterior tibial pulses are 2+ on the right side and 2+ on the left side.     Heart sounds: S1 normal and S2 normal. No murmur heard. No gallop.   Pulmonary:     Effort: Pulmonary effort is normal. No respiratory distress.     Breath sounds: Rhonchi present. No wheezing or rales.  Musculoskeletal:     Right lower leg: No edema.     Left lower leg: No edema.  Skin:    General: Skin is warm and dry.  Neurological:     Mental Status: She is alert.     Laboratory examination:   No results for input(s): NA, K, CL, CO2, GLUCOSE, BUN, CREATININE, CALCIUM, GFRNONAA, GFRAA in the last 8760 hours. CrCl cannot be calculated (No successful lab value found.).  CMP Latest Ref Rng & Units 06/09/2017  Glucose 65 - 99 mg/dL 101(H)  Sodium 135 - 145 mmol/L 144  Potassium 3.5 - 5.1 mmol/L 3.8   CBC Latest Ref Rng & Units 06/09/2017 05/28/2014 09/24/2013   WBC 4.0 - 10.5 K/uL - 4.6 -  Hemoglobin 12.0 - 15.0 g/dL 13.9 14.4 14.4  Hematocrit 36.0 - 46.0 % 41.0 41.0 -  Platelets 150 - 400 K/uL - 196 -    Lipid Panel No results for input(s): CHOL, TRIG, LDLCALC, VLDL, HDL, CHOLHDL, LDLDIRECT in the last 8760 hours.  HEMOGLOBIN A1C No results found for: HGBA1C, MPG TSH No results for input(s): TSH in the last 8760 hours.  External  labs:   09/14/2020: Sodium 138, potassium 4.2, creatinine 1.0, BUN 20, EGFR 58 Hemoglobin 13.9, hematocrit 40.3, platelets 257 TSH 2.39   Medications and allergies   Allergies  Allergen Reactions  . Diphenhydramine Hcl     Other reaction(s): Other (See Comments) Pt becomes extremely hyper; pt requested premed for Mogamulizumab per NP  . Sudafed [Pseudoephedrine Hcl] Palpitations     Outpatient Medications Prior to Visit  Medication Sig Dispense Refill  . amLODipine (NORVASC) 5 MG tablet Take 1 tablet (5 mg total) by mouth daily. 90 tablet 3  . B Complex Vitamins (VITAMIN B COMPLEX) TABS Take by mouth. 5000 UT    . Biotin 5000 MCG CAPS     . cetirizine (ZYRTEC) 10 MG tablet Take 10 mg by mouth daily.    Marland Kitchen Cod Liver Oil 1000 MG CAPS Take by mouth.    . ELDERBERRY PO Take by mouth daily.    . famotidine (PEPCID) 20 MG tablet Take 20 mg by mouth 2 (two) times daily.    . Nutritional Supplements (CHLORELLA-SPIRULINA COMPLEX) TABS Take 1 tablet by mouth daily.    . sodium chloride 0.9 % SOLN 250 mL with mogamulizumab-kpkc 20 MG/5ML SOLN 1 mg/kg Inject 1 mg/kg into the vein.    Marland Kitchen triamcinolone cream (KENALOG) 0.1 %     . UNABLE TO FIND Wheat grass    . metoprolol tartrate (LOPRESSOR) 25 MG tablet Take 0.5 tablets (12.5 mg total) by mouth 2 (two) times daily. (Patient taking differently: Take 12.5 mg by mouth daily.) 60 tablet 3   No facility-administered medications prior to visit.     Radiology:   No results found.  Cardiac Studies:   LONG TERM MONITOR 10/07/2020-10/21/2020:  Patient had minimum  heart rate of 46 bpm, maximum heart rate of 23 bpm with an average heart rate of 73 bpm. Predominant rhythm was sinus. There were 12 patient triggered events which primarily correlated with sinus rhythm, and occasional ventricular ectopy. Patient did have symptomatic supraventricular tachycardia with the fastest interval lasting 7 beats at a maximum rate of 203 bpm, longest lasting 8 beats with an average heart rate of 93 bpm. Rare PVCs and PACs. Patient also with several brief episodes of atrial tachycardia.  Echocardiogram 02/09/2017: 1.  Left ventricle cavity is normal in size.  Normal global wall motion.  Normal diastolic filling pattern.  Normal LAP.  Calculated EF 50% 2.  Trace mitral regurgitation. 3.  Trace tricuspid regurgitation.  No evidence of pulmonary hypertension  Stress test 02/13/2017: Resting EKG demonstrates normal sinus rhythm.  The patient exercised according to Bruce protocol, total time recorded 9 minutes achieving maximum heart rate of 169 which was 107% of target heart rate for age and 10.16 METS of work.  Stress terminated due to fatigue, THR met.  Hypertensive blood pressure response.  There was no ST-T changes of ischemia with exercise stress test.  There were no significant arrhythmias.  Normal blood pressure response. No evidence of ischemia by GXT.  Exercise tolerance is normal.  Hypertensive blood pressure response.  Continue preventive therapy.  Bilateral carotid artery duplex 01/30/2017: Minimal amount of bilateral intimal thickening and atherosclerotic plaque, right greater than left, not resulting in a hemodynamically significant stenosis within either internal carotid artery.  EKG:   EKG 10/22/2020: Sinus rhythm at a rate of 66 bpm.  Normal axis.  Poor progression, cannot exclude anteroseptal infarct age indeterminate.  Compared to EKG 10/07/2020, no significant change.  Assessment     ICD-10-CM  1. Palpitations  R00.2 metoprolol tartrate (LOPRESSOR) 25 MG  tablet  2. Hypertension, essential  I10      Medications Discontinued During This Encounter  Medication Reason  . metoprolol tartrate (LOPRESSOR) 25 MG tablet     Meds ordered this encounter  Medications  . metoprolol tartrate (LOPRESSOR) 25 MG tablet    Sig: Take 0.5 tablets (12.5 mg total) by mouth daily.    Recommendations:   Willia Genrich Cullipher is a 70 y.o. female with history of hypertension, hyperlipidemia, palpitations, mycosis fungoides, T-cell lymphoma, and colorectal cancer.  She was previously seen in our office 03/06/2017 for palpitations and family history of premature coronary artery disease.  She presented to reestablish care 10/07/2020, again for evaluation of palpitations.  Patient presents for 1 month follow-up of palpitations and hypertension.  Symptoms of palpitations have essentially resolved, with only occasional brief episodes.  Patient is tolerating present medications well.  In regard to hypertension blood pressures under excellent control, although it was elevated in our office today.  Will not make changes to current medications, will continue amlodipine 5 mg daily and Lopressor 12.5 mg once daily.  Discussed with patient regarding typical dosing of Lopressor twice daily, however patient reports she experienced fatigue with taking it twice daily and has been taking Lopressor 12.5 mg once daily for approximately 2 weeks and fatigue has improved and palpitations are well controlled.  Advised patient she can continue with once daily dosing of Lopressor, if she experiences increased palpitation symptoms she can increase Lopressor to 12.5 mg twice daily.  Reviewed and discussed with patient results of cardiac monitor, she verbalized understanding.  Cardiac monitoring revealed occasional symptomatic PVCs and supraventricular tachycardia.  We will continue medical management with metoprolol titrate.  Encourage patient to remain active and continue to exercise on a regular  basis.  Of note patient continues to be anxious regarding her health and requests that we consider repeating echocardiogram and stress test in the future, shared decision was to readdress further cardiac testing at follow-up visit.  Follow-up in 6 months, sooner if needed, for palpitations and hypertension.   Alethia Berthold, PA-C 11/26/2020, 10:00 AM Office: [phone]

## 2020-11-24 ENCOUNTER — Other Ambulatory Visit: Payer: Self-pay | Admitting: Hematology

## 2020-11-24 ENCOUNTER — Other Ambulatory Visit: Payer: Self-pay

## 2020-11-24 ENCOUNTER — Inpatient Hospital Stay: Payer: Medicare Other | Attending: Hematology | Admitting: Hematology

## 2020-11-24 VITALS — BP 145/77 | HR 72 | Temp 97.4°F | Resp 18 | Ht 62.0 in | Wt 128.7 lb

## 2020-11-24 DIAGNOSIS — Z79899 Other long term (current) drug therapy: Secondary | ICD-10-CM | POA: Insufficient documentation

## 2020-11-24 DIAGNOSIS — R21 Rash and other nonspecific skin eruption: Secondary | ICD-10-CM | POA: Diagnosis not present

## 2020-11-24 DIAGNOSIS — G629 Polyneuropathy, unspecified: Secondary | ICD-10-CM | POA: Insufficient documentation

## 2020-11-24 DIAGNOSIS — C8408 Mycosis fungoides, lymph nodes of multiple sites: Secondary | ICD-10-CM | POA: Diagnosis not present

## 2020-11-24 DIAGNOSIS — Z86010 Personal history of colonic polyps: Secondary | ICD-10-CM | POA: Insufficient documentation

## 2020-11-24 DIAGNOSIS — K219 Gastro-esophageal reflux disease without esophagitis: Secondary | ICD-10-CM | POA: Diagnosis not present

## 2020-11-24 DIAGNOSIS — C8400 Mycosis fungoides, unspecified site: Secondary | ICD-10-CM | POA: Diagnosis not present

## 2020-11-24 DIAGNOSIS — E785 Hyperlipidemia, unspecified: Secondary | ICD-10-CM | POA: Diagnosis not present

## 2020-11-24 DIAGNOSIS — I10 Essential (primary) hypertension: Secondary | ICD-10-CM | POA: Insufficient documentation

## 2020-11-24 DIAGNOSIS — R110 Nausea: Secondary | ICD-10-CM | POA: Insufficient documentation

## 2020-11-24 DIAGNOSIS — M8580 Other specified disorders of bone density and structure, unspecified site: Secondary | ICD-10-CM | POA: Diagnosis not present

## 2020-11-24 NOTE — Progress Notes (Signed)
HEMATOLOGY/ONCOLOGY CONSULTATION NOTE  Date of Service: 11/24/2020  Patient Care Team: Chesley Noon, MD as PCP - General (Family Medicine)  CHIEF COMPLAINTS/PURPOSE OF CONSULTATION:  Mycosis fungoides  HISTORY OF PRESENTING ILLNESS:   Alexa [name] is a wonderful 70 y.o. 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 The pt reports that she is doing well overall.   Of note from [place], where she is receiving treatment, "1. CTCL A. 08/2015 developed tan circles on bilateral shins which did not change over several months. 01/2016, she noticed a red patch on her left posterior calf which prompted presentation to her local dermatologist, Dr. Tonia Brooms in [place].  B. 02/14/16 punch biopsy was done of a pink-brown plaque on the right anterior thigh that showed ALI and was suspicious for CTCL, but did not confirm the diagnosis of CTCL/MF. Clobetasol prescribed and was effective until 03/2017 and was started on MTX (10mg  weekly) by Dr. Shaaron Adler.  C. 05/25/20-09/14/20 6 cycles of brentuximab D. 11/09/20 C1D1 Mogamulizumab".   Also from [place], "2. Rectal carcinoma  A. 05/19/17 CT C/A/P was unremarkable and showed no evidence for metastatic disease B. 06/09/17 Patient had recent rectal polyp ectomy which revealed cancer within the specimen. Subsequent transanal excision  of the polypectomy site was performed 06/09/2017. Negative margins at the time of mucosal resection."  The pt reports that she is seeing Dr. Lanier Ensign at [facility]. She believes the spike in T-cells is due to the vaccination. She has the CD30 receptor that Brentuximab targets. She started with the Brentuximab in July for 6 treatments and then Mogamulizumab. This all started with a skin rash on her lower body. She notes her last CT scan was around November of last year, and denies enlarged lymph nodes. 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 This, in addition to neuropathy, is what influenced the switch to Mogamulizumab. This has not helped much, according to her, but has also had no side effects.   The pt notes that her skin involvement is as bad as it has been in a long time, and covers her entire body except the face and head regions. She goes for her fourth treatment tomorrow. She wishes to get infusions done at [facility] following the sixth cycle, but keep her doctors at [facility].  The pt notes that the itching goes away during night, but is bothersome during day since treatments has started. She claims this is very distracting.   The pt noted that she did not use the Benadryl with first treatments of Bentruximab, which caused her to lose her hair and experience harsh side effects.  The pt notes that she takes Zyrtec and Pepcid daily. She is active and works out many times a week. She goes to church, does pilates, cooks, daily walks, and notes no fatigue or weakness.  She desires for a small dose of steroids in her pre-treatment medications.   The pt notes that she started having heart palpitations in November 2021. She was given Metopolol to help with this. There was nothing on her two-week monitor that was of concern, she notes. She notes there are no major palpitations currently. This all started with C6 Brentruximab.  The pt notes  that she had a polyp removed in 2018 (Transanal Excision of Rectal Mass). She continues to follow up as needed with Dr. Marcello Moores for this.   She claims Oxycodone makes her extremely nauseous and never wishes to take this again.  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 She note this was a very stressful work environment.  On  review of systems, pt reports skin rashes on all her body and irregular nightsweats except head/face, slight neuropathy, itching, tingling and denies n/v/d, chills, unexpected weight loss, fatigue, weakness, belly pain, abdominal pain, leg swelling and any other symptoms.   MEDICAL HISTORY:  Past Medical History:  Diagnosis Date  . Adenomatous polyp   . Cancer ([place])    cutaneous T-cell lymphoma; followed by Dr. Clovis Riley @ [facility]  . Family history of anesthesia complication    sister and sister's children postitive for [place]!!!!!!, pt tested negative 3-61yrs ago  . GERD (gastroesophageal reflux disease)   . History of hypertension    off medication after weight loss  . Hyperlipemia   . Lymphoma ([place])    light box therapy  . 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[place])   . Osteopenia    bilateral hips  . Rectal cancer ([place]) 04/2017    SURGICAL HISTORY: Past Surgical History:  Procedure Laterality Date  . COLONOSCOPY  5/13   with biopsy  . DILATATION & CURRETTAGE/HYSTEROSCOPY WITH RESECTOCOPE N/A 06/02/2014   Procedure: [place];  Surgeon: Lyman Speller, MD;  Location: [facility];  Service: Gynecology;  Laterality: N/A;  . FOOT SURGERY     Right   . TRANSANAL EXCISION OF RECTAL MASS N/A 06/09/2017   Procedure: TRANSANAL EXCISION OF RECTAL MASS;  Surgeon: [name], MD;  Location: [place];  Service: General;  Laterality: N/A;    SOCIAL HISTORY: Social History   Socioeconomic History  . Marital status: Single    Spouse name: Not on file  . Number of children: 0  . Years of education: Not on file  . Highest education level: Not on file  Occupational History  . Not on file  Tobacco Use  . Smoking status: Never Smoker  . Smokeless tobacco: Never Used  Vaping Use  . Vaping Use: Never used  Substance and Sexual Activity  . Alcohol use: Yes    Alcohol/week: 0.0 - 1.0 standard drinks  . Drug use: No  . Sexual activity: Not Currently     Partners: Male    Birth control/protection: Post-menopausal  Other Topics Concern  . Not on file  Social History Narrative  . Not on file   Social Determinants of Health   Financial Resource Strain: Not on file  Food Insecurity: Not on file  Transportation Needs: Not on file  Physical Activity: Not on file  Stress: Not on file  Social Connections: Not on file  Intimate Partner Violence: Not on file    FAMILY HISTORY: Family History  Problem Relation Age of Onset  . Anuerysm Father   . Heart disease Father   . Diabetes Father   . Cancer Father        unknown type  . Hypertension Father   . Heart attack Mother   . Heart disease Mother   . Kidney disease Mother   . Kidney cancer Mother        contained tumor  . Hypertension Mother   . Heart attack Brother   .  Cancer Brother        liver cancer  . Melanoma Brother   . Other Brother        Bypass surgery   . Hypertension Sister   . Ovarian cancer Paternal Grandmother        was PMP  . Colon cancer Neg Hx     ALLERGIES:  is allergic to sudafed [pseudoephedrine hcl].  MEDICATIONS:  Current Outpatient Medications  Medication Sig Dispense Refill  . amLODipine (NORVASC) 5 MG tablet Take 1 tablet (5 mg total) by mouth daily. 90 tablet 3  . B Complex Vitamins (VITAMIN B COMPLEX) TABS Take by mouth. 5000 UT    . cetirizine (ZYRTEC) 10 MG tablet Take 10 mg by mouth daily.    Marland Kitchen Cod Liver Oil 1000 MG CAPS Take by mouth.    . ELDERBERRY PO Take by mouth daily.    . famotidine (PEPCID) 20 MG tablet Take 20 mg by mouth 2 (two) times daily.    . metoprolol tartrate (LOPRESSOR) 25 MG tablet Take 0.5 tablets (12.5 mg total) by mouth 2 (two) times daily. 60 tablet 3  . triamcinolone cream (KENALOG) 0.1 %     . UNABLE TO FIND Wheat grass     No current facility-administered medications for this visit.    REVIEW OF SYSTEMS:    10 Point review of Systems was done is negative except as noted above.  PHYSICAL  EXAMINATION: ECOG PERFORMANCE STATUS: 1 - Symptomatic but completely ambulatory  . Vitals:   11/24/20 1110  BP: (!) 145/77  Pulse: 72  Resp: 18  Temp: (!) 97.4 F (36.3 C)  SpO2: 100%   Filed Weights   11/24/20 1110  Weight: 128 lb 11.2 oz (58.4 kg)   .Body mass index is 23.54 kg/m.   GENERAL:alert, in no acute distress and comfortable SKIN: generalize erythroderma over extremities and trunk. EYES: conjunctiva are pink and non-injected, sclera anicteric OROPHARYNX: MMM, no exudates, no oropharyngeal erythema or ulceration NECK: supple, no JVD LYMPH:  no palpable lymphadenopathy in the cervical, axillary or inguinal regions LUNGS: clear to auscultation b/l with normal respiratory effort HEART: regular rate & rhythm ABDOMEN:  consistent with [place] Extremity: no pedal edema PSYCH: alert & oriented x 3 with fluent speech NEURO: no focal motor/sensory deficits  LABORATORY DATA:  I have reviewed the data as listed  . CBC Latest Ref Rng & Units 06/09/2017 05/28/2014 09/24/2013  WBC 4.0 - 10.5 K/uL - 4.6 -  Hemoglobin 12.0 - 15.0 g/dL 13.9 14.4 14.4  Hematocrit 36.0 - 46.0 % 41.0 41.0 -  Platelets 150 - 400 K/uL - 196 -    . CMP Latest Ref Rng & Units 06/09/2017  Glucose 65 - 99 mg/dL 101(H)  Sodium 135 - 145 mmol/L 144  Potassium 3.5 - 5.1 mmol/L 3.8     RADIOGRAPHIC STUDIES: I have personally reviewed the radiological images as listed and agreed with the findings in the report. No results found.  ASSESSMENT & PLAN:   70 yo with   1) CTCL with previous treatment as noted above. Plan - Advised pt that Mogamulizumab can cause muscle inflammation, but is uncommon. More commonly is autoimmune, dermatological toxicity, and liver problems. -Recommend pt start back up Singulair once daily.  -Advised pt that a clear communication and plan must be implemented between [facility] and [facility]. This is of increased importance when undergoing getting a potentially toxic  medication. -Advised pt of the potential errors of dealing with multiple providers for same  treatment. -Advised pt that ideal treatment is treatment at same location where primary Oncologist is. There is possibility for different location, but must be watched carefully due to complicated nature. -Discussed the possibility of working with Dr. Lanier Ensign at [facility] and Dr. Grier Mitts preference of being more hands-on with treatment and decisions. -Discussed potential of doing another Brentuximab cycle due to positive response first three weeks with alterations in length or dosage. -Discussed pt's wishes to start treatments here and continue with Dr. Lanier Ensign at [facility]. Will seek approval for this local infusions.  -Will see back on C2D15.   Follow Up Plz schedule to start Mogamulizumab in 2 weeks from [place] with labs MD visit with 2nd dose C2D15 for toxicity check  All of the patients questions were answered with apparent satisfaction. The patient knows to call the clinic with any problems, questions or concerns.  I spent 60 minutes counseling the patient face to face. The total time spent in the appointment was 80 minutes and more than 50% was on counseling and direct patient cares.    Sullivan Lone MD MS AAHIVMS [facility] Hematology/Oncology Physician [facility]  (Office):       [phone] (Work cell):  [phone] (Fax):           [phone]  I have reviewed the above documentation for accuracy and completeness, and I agree with the above.   11/24/2020 7:46 AM

## 2020-11-24 NOTE — Patient Instructions (Signed)
Thank you for choosing [facility] to provide your oncology and hematology care.   Should you have questions after your visit to the [facility] ([facility]), please contact this office at [phone] between 8:30 AM and 4:30 PM.  Voice mails left after 4:00 PM may not be returned until the following business day.  Calls received after 4:30 PM will be answered by an off-site Nurse Triage Line.    Prescription Refills:  Please have your pharmacy contact us directly for most prescription requests.  Contact the office directly for refills of narcotics (pain medications). Allow 48-72 hours for refills.  Appointments: Please contact the [facility] scheduling department [phone] for questions regarding [facility] appointment scheduling.  Contact the schedulers with any scheduling changes so that your appointment can be rescheduled in a timely manner.   Central Scheduling for [place] [phone] - Call to schedule procedures such as PET scans, CT scans, MRI, Ultrasound, etc.  To afford each patient quality time with our providers, please arrive 30 minutes before your scheduled appointment time.  If you arrive late for your appointment, you may be asked to reschedule.  We strive to give you quality time with our providers, and arriving late affects you and other patients whose appointments are after yours. If you are a no show for multiple scheduled visits, you may be dismissed from the clinic at the providers discretion.     Resources: [facility] Social Workers [phone] for additional information on assistance programs or assistance connecting with community support programs   [place] DSS  336-641-3447: Information regarding food stamps, Medicaid, and utility assistance [place] [employer] [place] 336-333-6589   [place] [employer]'s shared-ride transportation service for eligible riders who have a disability that prevents them from riding the fixed route bus.   Medicare  Rights Center 800-333-4114 Helps people with Medicare understand their rights and benefits, navigate the Medicare system, and secure the quality healthcare they deserve [employer] 800-227-2345 Assists patients locate various types of support and financial assistance Cancer Care: 1-800-813-HOPE (4673) Provides financial assistance, online support groups, medication/co-pay assistance.   Transportation Assistance for appointments at [facility]: Transportation Coordinator [phone]  Again, thank you for choosing [facility] for your care.       

## 2020-11-26 ENCOUNTER — Encounter: Payer: Self-pay | Admitting: Student

## 2020-11-26 ENCOUNTER — Other Ambulatory Visit: Payer: Self-pay

## 2020-11-26 ENCOUNTER — Ambulatory Visit: Payer: Medicare Other | Admitting: Student

## 2020-11-26 VITALS — BP 141/79 | HR 66 | Temp 98.3°F | Resp 16 | Ht 62.0 in | Wt 126.0 lb

## 2020-11-26 DIAGNOSIS — R002 Palpitations: Secondary | ICD-10-CM

## 2020-11-26 DIAGNOSIS — I10 Essential (primary) hypertension: Secondary | ICD-10-CM

## 2020-11-26 MED ORDER — METOPROLOL TARTRATE 25 MG PO TABS: 12.5000 mg | ORAL_TABLET | Freq: Every day | ORAL | Status: DC

## 2020-11-27 ENCOUNTER — Telehealth: Payer: Self-pay

## 2020-11-27 NOTE — Telephone Encounter (Signed)
Spoke with pt regarding the following:  1. Regarding the first infusion appt. date of 12/08/20, she explained that her last infusion with [facility] is going to be on Wednesday 12/02/20 which will make the appt. for 12/08/20 too soon because it will be less than a week from her infusion and she usually gets 2 weeks in between each infusion. Told pt. Dr Irene Limbo would be informed so he could review and advise further.  2. She had multiple questions regarding insurance, payment, assistance etc. Provided financial advocate numbers: [phone], [phone] for pt. to call for more information.  Informed pt. that scheduling would reach out to her if appt. date needs to be changed.

## 2020-11-30 ENCOUNTER — Ambulatory Visit: Payer: Medicare Other | Admitting: Hematology & Oncology

## 2020-12-01 ENCOUNTER — Encounter: Payer: Self-pay | Admitting: Hematology

## 2020-12-01 NOTE — Progress Notes (Signed)
Pt called to inquire about her ins coverage.  I informed her [place] should pick up the 20% coins.  She wants to be absolutely sure so I advised her to call [employer] to check on her ins coverage.  I also informed her of the [employer] and went over what it covers.  Pt stated she's been blessed so she declined it at this time.

## 2020-12-04 ENCOUNTER — Other Ambulatory Visit: Payer: Self-pay | Admitting: Hematology

## 2020-12-04 DIAGNOSIS — Z7189 Other specified counseling: Secondary | ICD-10-CM

## 2020-12-04 NOTE — Progress Notes (Signed)
Called patient to address her concerns regarding the timing of starting her Mogamulizumab here. She was very appreciative of the call and discussion.  She notes that she is feeling better and her skin rash has improved. She would like to get cycle 2-day 1 and [facility] to allow enough time to transition her treatment here. She will let us know the date for cycle 2-day 1 and we will schedule her to start cycle 2-day 15 at [facility] accordingly. She prefers treatments on Wednesdays. She had questions about the possible differences in cost if she were to get treatment here versus at [facility].  I have emailed our pharmacy to see if they can address her question or if she needs to talk to [facility]

## 2020-12-08 ENCOUNTER — Ambulatory Visit: Payer: Medicare Other

## 2020-12-08 ENCOUNTER — Other Ambulatory Visit: Payer: Medicare Other

## 2020-12-23 ENCOUNTER — Encounter: Payer: Self-pay | Admitting: Hematology

## 2020-12-31 ENCOUNTER — Telehealth: Payer: Self-pay | Admitting: *Deleted

## 2020-12-31 ENCOUNTER — Encounter (HOSPITAL_BASED_OUTPATIENT_CLINIC_OR_DEPARTMENT_OTHER): Payer: Self-pay

## 2020-12-31 NOTE — Telephone Encounter (Signed)
Spoke with patient regarding scheduling treatments at [place]. Advised her that process here is that infusions not scheduled until authorized by insurance. She stated that was a good idea and that once scheduled she would be in touch with her insurer to confirm cost of treatment here compared to cost at [facility] (where she has been receiving treatment) She stated that next treatment would be Day 1 of Cycle 3 and should occur on 3/9. Patient also has some questions she wants to discuss with Dr. Irene Limbo - advised her that she will have labs drawn and have an appt with him prior to beginning treatment.  Dr. Irene Limbo informed. Per Dr. Irene Limbo - send schedule message for lab/MD appt/Infusion appt on 3/9 and that he will talk with her during appt.   Informed patient to expect call from scheduling to confirm dates and times. Also that she can speak with Dr. Irene Limbo during her appt. Patient verbalized understanding of all information.

## 2020-12-31 NOTE — Telephone Encounter (Signed)
Can you please call this pt and schedule her for an appointment?  Thank you.

## 2021-01-01 ENCOUNTER — Other Ambulatory Visit: Payer: Self-pay | Admitting: Hematology

## 2021-01-08 ENCOUNTER — Telehealth: Payer: Self-pay | Admitting: Hematology

## 2021-01-08 NOTE — Telephone Encounter (Signed)
Scheduled per scheduled message, patient has been called and notified regarding 03/09 appointment.

## 2021-01-11 ENCOUNTER — Telehealth: Payer: Self-pay

## 2021-01-11 ENCOUNTER — Other Ambulatory Visit: Payer: Self-pay

## 2021-01-11 DIAGNOSIS — C8408 Mycosis fungoides, lymph nodes of multiple sites: Secondary | ICD-10-CM

## 2021-01-11 NOTE — Telephone Encounter (Signed)
Patient called to confirm the pre-meds she would be getting for her treatment on Wednesday 01/13/21. Informed her she would be getting Solu-medrol, benadryl and tylenol, and that this was verified by our pharmacist here. She also had questions about prednisone Rx for the flare ups she gets from the treatment, informed her per Dr. Irene Limbo, this will be discussed during her visit with him on Wednesday. Patient was agreeable and verbalized understanding.

## 2021-01-12 NOTE — Progress Notes (Signed)
HEMATOLOGY/ONCOLOGY CONSULTATION NOTE  Date of Service: 01/13/2021  Patient Care Team: Chesley Noon, MD as PCP - General (Family Medicine)  CHIEF COMPLAINTS/PURPOSE OF CONSULTATION:  Mycosis fungoides  HISTORY OF PRESENTING ILLNESS:   Alexa [name] is a wonderful 70 y.o. 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 The pt reports that she is doing well overall.   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 01/2016, she noticed a red patch on her left posterior calf which prompted presentation to her local dermatologist, Dr. Tonia Brooms in [place].  B. 02/14/16 punch biopsy was done of a pink-brown plaque on the right anterior thigh that showed ALI and was suspicious for CTCL, but did not confirm the diagnosis of CTCL/MF. Clobetasol prescribed and was effective until 03/2017 and was started on MTX (10mg  weekly) by Dr. Shaaron Adler.  C. 05/25/20-09/14/20 6 cycles of brentuximab D. 11/09/20 C1D1 Mogamulizumab".   Also from [place], "2. Rectal carcinoma  A. 05/19/17 CT C/A/P was unremarkable and showed no evidence for metastatic disease B. 06/09/17 Patient had recent rectal polyp ectomy which revealed cancer within the specimen. Subsequent transanal excision  of the polypectomy site was performed 06/09/2017. Negative margins at the time of mucosal resection."  The pt reports that she is seeing Dr. Lanier Ensign at [facility]. She believes the spike in T-cells is due to the vaccination. She has the CD30 receptor that Brentuximab targets. She started with the Brentuximab in July for 6 treatments and then Mogamulizumab. This all started with a skin rash on her lower body. She notes her last CT scan was around November of last year, and denies enlarged lymph nodes. 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 This, in addition to neuropathy, is what influenced the switch to Mogamulizumab. This has not helped much, according to her, but has also had no side effects.   The pt notes that her skin involvement is as bad as it has been in a long time, and covers her entire body except the face and head regions. She goes for her fourth treatment tomorrow. She wishes to get infusions done at [facility] following the sixth cycle, but keep her doctors at [facility].  The pt notes that the itching goes away during night, but is bothersome during day since treatments has started. She claims this is very distracting.   The pt noted that she did not use the Benadryl with first treatments of Bentruximab, which caused her to lose her hair and experience harsh side effects.  The pt notes that she takes Zyrtec and Pepcid daily. She is active and works out many times a week. She goes to church, does pilates, cooks, daily walks, and notes no fatigue or weakness.  She desires for a small dose of steroids in her pre-treatment medications.   The pt notes that she started having heart palpitations in November 2021. She was given Metopolol to help with this. There was nothing on her two-week monitor that was of concern, she notes. She notes there are no major palpitations currently. This all started with C6 Brentruximab.  The pt notes  that she had a polyp removed in 2018 (Transanal Excision of Rectal Mass). She continues to follow up as needed with Dr. Marcello Moores for this.   She claims Oxycodone makes her extremely nauseous and never wishes to take this again.  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 She note this was a very stressful work environment.  On  review of systems, pt reports skin rashes on all her body and irregular nightsweats except head/face, slight neuropathy, itching, tingling and denies n/v/d, chills, unexpected weight loss, fatigue, weakness, belly pain, abdominal pain, leg swelling and any other symptoms.  INTERVAL HISTORY  Alexa [name] is a wonderful 70 y.o. 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 The patient's last visit with [place] was on 12/04/2020 . The pt reports that she is doing well overall. She is here for C3D1 Mogamulizumab.   The pt reports that she feels fine and is tolerating the treatment well. She is experiencing some angry skin due to the treatment. She was given Prednisone and this helped her completely.  Lab results today 01/13/2021 of CBC w/diff and CMP is as follows: all values are WNL except for Glucose of 103. 01/13/2021 LDH of 241.  On review of systems, pt reports grade 1 skin sensitization and irritaiton and denies diarrhea, constipation, fevers, chills, night sweats, abdominal pain, back pain, leg swelling, mouth sores, and any other symptoms.  MEDICAL HISTORY:  Past Medical History:  Diagnosis Date  . Adenomatous polyp   . 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 Family history of anesthesia complication    sister and sister's children postitive for [facility]!!!!!!, pt tested negative 3-42yrs ago  . GERD (gastroesophageal reflux disease)   . History of hypertension    off medication after weight loss  . Hyperlipemia   . Lymphoma ([place])    light box therapy  . 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[place])   . Osteopenia    bilateral hips  . Rectal cancer ([place]) 04/2017    SURGICAL HISTORY: Past Surgical History:  Procedure Laterality Date  . COLONOSCOPY  5/13   with biopsy  . DILATATION & CURRETTAGE/HYSTEROSCOPY WITH RESECTOCOPE N/A 06/02/2014   Procedure: [place];  Surgeon:  Lyman Speller, MD;  Location: [facility];  Service: Gynecology;  Laterality: N/A;  . FOOT SURGERY     Right   . TRANSANAL EXCISION OF RECTAL MASS N/A 06/09/2017   Procedure: TRANSANAL EXCISION OF RECTAL MASS;  Surgeon: [name], MD;  Location: [facility];  Service: General;  Laterality: N/A;    SOCIAL HISTORY: Social History   Socioeconomic History  . Marital status: Single    Spouse name: Not on file  . Number of children: 0  . Years of education: Not on file  . Highest education level: Not on file  Occupational History  . Not on file  Tobacco Use  . Smoking status: Never Smoker  . Smokeless tobacco: Never Used  Vaping Use  . Vaping Use: Never used  Substance and Sexual Activity  . Alcohol use: Yes    Alcohol/week: 0.0 - 1.0 standard drinks  . Drug use: No  . Sexual activity: Not Currently    Partners: Male    Birth control/protection: Post-menopausal  Other Topics Concern  . Not on file  Social History Narrative  . Not  on file   Social Determinants of Health   Financial Resource Strain: Not on file  Food Insecurity: Not on file  Transportation Needs: Not on file  Physical Activity: Not on file  Stress: Not on file  Social Connections: Not on file  Intimate Partner Violence: Not on file    FAMILY HISTORY: Family History  Problem Relation Age of Onset  . Anuerysm Father   . Heart disease Father   . Diabetes Father   . Cancer Father        unknown type  . Hypertension Father   . Heart attack Mother   . Heart disease Mother   . Kidney disease Mother   . Kidney cancer Mother        contained tumor  . Hypertension Mother   . Heart attack Brother   . Cancer Brother        liver cancer  . Melanoma Brother   . Other Brother        Bypass surgery   . Hypertension Sister   . Ovarian cancer Paternal Grandmother        was PMP  . Colon cancer Neg Hx     ALLERGIES:  is allergic to diphenhydramine hcl and sudafed [pseudoephedrine  hcl].  MEDICATIONS:  Current Outpatient Medications  Medication Sig Dispense Refill  . amLODipine (NORVASC) 5 MG tablet Take 1 tablet (5 mg total) by mouth daily. 90 tablet 3  . B Complex Vitamins (VITAMIN B COMPLEX) TABS Take by mouth. 5000 UT    . Biotin 5000 MCG CAPS     . cetirizine (ZYRTEC) 10 MG tablet Take 10 mg by mouth daily.    Marland Kitchen Cod Liver Oil 1000 MG CAPS Take by mouth.    . ELDERBERRY PO Take by mouth daily.    . famotidine (PEPCID) 20 MG tablet Take 20 mg by mouth 2 (two) times daily.    . metoprolol tartrate (LOPRESSOR) 25 MG tablet Take 0.5 tablets (12.5 mg total) by mouth daily.    . Nutritional Supplements (CHLORELLA-SPIRULINA COMPLEX) TABS Take 1 tablet by mouth daily.    . sodium chloride 0.9 % SOLN 250 mL with mogamulizumab-kpkc 20 MG/5ML SOLN 1 mg/kg Inject 1 mg/kg into the vein.    Marland Kitchen triamcinolone cream (KENALOG) 0.1 %     . UNABLE TO FIND Wheat grass     No current facility-administered medications for this visit.    REVIEW OF SYSTEMS:   10 Point review of Systems was done is negative except as noted above.  PHYSICAL EXAMINATION: ECOG PERFORMANCE STATUS: 1 - Symptomatic but completely ambulatory  . Vitals:   01/13/21 1324  BP: 133/77  Pulse: 81  Resp: 18  Temp: 97.6 F (36.4 C)  SpO2: 99%   Filed Weights   01/13/21 1324  Weight: 128 lb (58.1 kg)   .Body mass index is 23.41 kg/m.  GENERAL:alert, in no acute distress and comfortable SKIN: no acute rashes, no significant lesions EYES: conjunctiva are pink and non-injected, sclera anicteric OROPHARYNX: MMM, no exudates, no oropharyngeal erythema or ulceration NECK: supple, no JVD LYMPH:  no palpable lymphadenopathy in the cervical, axillary or inguinal regions LUNGS: clear to auscultation b/l with normal respiratory effort HEART: regular rate & rhythm ABDOMEN:  normoactive bowel sounds , non tender, not distended. Extremity: no pedal edema PSYCH: alert & oriented x 3 with fluent speech NEURO:  no focal motor/sensory deficits  LABORATORY DATA:  I have reviewed the data as listed  . CBC Latest Ref  Rng & Units 01/13/2021 06/09/2017 05/28/2014  WBC 4.0 - 10.5 K/uL 5.6 - 4.6  Hemoglobin 12.0 - 15.0 g/dL 14.6 13.9 14.4  Hematocrit 36.0 - 46.0 % 41.1 41.0 41.0  Platelets 150 - 400 K/uL 192 - 196    . CMP Latest Ref Rng & Units 01/13/2021 06/09/2017  Glucose 70 - 99 mg/dL 103(H) 101(H)  BUN 8 - 23 mg/dL 19 -  Creatinine 0.44 - 1.00 mg/dL 0.98 -  Sodium 135 - 145 mmol/L 139 144  Potassium 3.5 - 5.1 mmol/L 4.1 3.8  Chloride 98 - 111 mmol/L 104 -  CO2 22 - 32 mmol/L 24 -  Calcium 8.9 - 10.3 mg/dL 9.0 -  Total Protein 6.5 - 8.1 g/dL 7.5 -  Total Bilirubin 0.3 - 1.2 mg/dL 0.9 -  Alkaline Phos 38 - 126 U/L 80 -  AST 15 - 41 U/L 27 -  ALT 0 - 44 U/L 28 -     RADIOGRAPHIC STUDIES: I have personally reviewed the radiological images as listed and agreed with the findings in the report. No results found.  ASSESSMENT & PLAN:   70 yo with   1) CTCL with previous treatment as noted above.  PLAN: -Discussed pt labwork today, 01/13/2021; blood counts completely normal,  Blood chemistries completely normal. -Advised pt that sensitization due to treatment can occur and we may need to increase dose of Solumedrol to block some side effects.  -Advised pt that we need to pre-medicate more strongly. Recommended pt we may need to increase Benadryl dosage. -Will keep Benadryl and Tylenol dosage the same. Will increase Solumedrol.  -Advised pt we will give Prednisone for use prn after treatment for backup. Will attempt to control this with pre-medications to avoid steroid usage after.  -Advised pt it is acceptable to use topical steroid for angry skin on neck area. -Advised pt the primary driver for management and toxicity checks will be at [facility].  -Advised pt that dry skin is a natural side effect, as well as hyperpigmentation.  -Emphasized our role of managing toxicity and infusions. Dr. Lanier Ensign  handles the plan and management of that.  -Discussed potential for [facility] and pt's concerns. -Continue Singulair once daily.  -Continue Pepcid and Zyrtec. Benadryl in addition if flare-up. -Will see back in 2 weeks.  -Rx Prednisone.   FOLLOW UP: Plz schedule next 4 treatments (every 2 weeks) of Mogamulizumab with labs. MD visit in 2 weeks and then every 4 weeks x 3   All of the patients questions were answered with apparent satisfaction. The patient knows to call the clinic with any problems, questions or concerns.  The total time spent in the appointment was 40 minutes and more than 50% was on counseling and direct patient cares and answering patients multiple questions, ordering and management of chemotherapy.    Sullivan Lone MD MS AAHIVMS [facility] Hematology/Oncology [facility]  (Office):       [phone] (Work cell):  [phone] (Fax):           [phone]  I have reviewed the above documentation for accuracy and completeness, and I agree with the above.   01/13/2021 1:40 PM  I, Reinaldo Raddle, am acting as scribe for Dr. Sullivan Lone, MD.   .I have reviewed the above documentation for accuracy and completeness, and I agree with the above. Brunetta Genera MD

## 2021-01-13 ENCOUNTER — Other Ambulatory Visit: Payer: Medicare Other

## 2021-01-13 ENCOUNTER — Inpatient Hospital Stay (HOSPITAL_BASED_OUTPATIENT_CLINIC_OR_DEPARTMENT_OTHER): Payer: Medicare Other | Admitting: Hematology

## 2021-01-13 ENCOUNTER — Encounter: Payer: Self-pay | Admitting: General Practice

## 2021-01-13 ENCOUNTER — Other Ambulatory Visit: Payer: Self-pay

## 2021-01-13 ENCOUNTER — Inpatient Hospital Stay: Payer: Medicare Other | Attending: Hematology

## 2021-01-13 ENCOUNTER — Inpatient Hospital Stay: Payer: Medicare Other

## 2021-01-13 VITALS — BP 133/77 | HR 81 | Temp 97.6°F | Resp 18 | Ht 62.0 in | Wt 128.0 lb

## 2021-01-13 DIAGNOSIS — E785 Hyperlipidemia, unspecified: Secondary | ICD-10-CM | POA: Diagnosis not present

## 2021-01-13 DIAGNOSIS — Z8051 Family history of malignant neoplasm of kidney: Secondary | ICD-10-CM | POA: Diagnosis not present

## 2021-01-13 DIAGNOSIS — C8400 Mycosis fungoides, unspecified site: Secondary | ICD-10-CM | POA: Diagnosis not present

## 2021-01-13 DIAGNOSIS — R110 Nausea: Secondary | ICD-10-CM | POA: Diagnosis not present

## 2021-01-13 DIAGNOSIS — C8590 Non-Hodgkin lymphoma, unspecified, unspecified site: Secondary | ICD-10-CM

## 2021-01-13 DIAGNOSIS — C8408 Mycosis fungoides, lymph nodes of multiple sites: Secondary | ICD-10-CM

## 2021-01-13 DIAGNOSIS — G629 Polyneuropathy, unspecified: Secondary | ICD-10-CM | POA: Diagnosis not present

## 2021-01-13 DIAGNOSIS — K219 Gastro-esophageal reflux disease without esophagitis: Secondary | ICD-10-CM | POA: Diagnosis not present

## 2021-01-13 DIAGNOSIS — M8580 Other specified disorders of bone density and structure, unspecified site: Secondary | ICD-10-CM | POA: Diagnosis not present

## 2021-01-13 DIAGNOSIS — Z86010 Personal history of colonic polyps: Secondary | ICD-10-CM | POA: Insufficient documentation

## 2021-01-13 DIAGNOSIS — Z7189 Other specified counseling: Secondary | ICD-10-CM

## 2021-01-13 DIAGNOSIS — Z5112 Encounter for antineoplastic immunotherapy: Secondary | ICD-10-CM | POA: Diagnosis present

## 2021-01-13 DIAGNOSIS — Z79899 Other long term (current) drug therapy: Secondary | ICD-10-CM | POA: Diagnosis not present

## 2021-01-13 LAB — CBC WITH DIFFERENTIAL (CANCER CENTER ONLY)
Abs Immature Granulocytes: 0.01 10*3/uL (ref 0.00–0.07)
Basophils Absolute: 0 10*3/uL (ref 0.0–0.1)
Basophils Relative: 1 %
Eosinophils Absolute: 0.2 10*3/uL (ref 0.0–0.5)
Eosinophils Relative: 3 %
HCT: 41.1 % (ref 36.0–46.0)
Hemoglobin: 14.6 g/dL (ref 12.0–15.0)
Immature Granulocytes: 0 %
Lymphocytes Relative: 16 %
Lymphs Abs: 0.9 10*3/uL (ref 0.7–4.0)
MCH: 33.4 pg (ref 26.0–34.0)
MCHC: 35.5 g/dL (ref 30.0–36.0)
MCV: 94.1 fL (ref 80.0–100.0)
Monocytes Absolute: 0.6 10*3/uL (ref 0.1–1.0)
Monocytes Relative: 10 %
Neutro Abs: 4 10*3/uL (ref 1.7–7.7)
Neutrophils Relative %: 71 %
Platelet Count: 192 10*3/uL (ref 150–400)
RBC: 4.37 MIL/uL (ref 3.87–5.11)
RDW: 11.8 % (ref 11.5–15.5)
WBC Count: 5.6 10*3/uL (ref 4.0–10.5)
nRBC: 0 /100 WBC

## 2021-01-13 LAB — CMP (CANCER CENTER ONLY)
ALT: 28 U/L (ref 0–44)
AST: 27 U/L (ref 15–41)
Albumin: 4 g/dL (ref 3.5–5.0)
Alkaline Phosphatase: 80 U/L (ref 38–126)
Anion gap: 11 (ref 5–15)
BUN: 19 mg/dL (ref 8–23)
CO2: 24 mmol/L (ref 22–32)
Calcium: 9 mg/dL (ref 8.9–10.3)
Chloride: 104 mmol/L (ref 98–111)
Creatinine: 0.98 mg/dL (ref 0.44–1.00)
GFR, Estimated: >60 mL/min (ref >60)
Glucose, Bld: 103 mg/dL — ABNORMAL HIGH (ref 70–99)
Potassium: 4.1 mmol/L (ref 3.5–5.1)
Sodium: 139 mmol/L (ref 135–145)
Total Bilirubin: 0.9 mg/dL (ref 0.3–1.2)
Total Protein: 7.5 g/dL (ref 6.5–8.1)

## 2021-01-13 LAB — LACTATE DEHYDROGENASE: LDH: 241 U/L — ABNORMAL HIGH (ref 98–192)

## 2021-01-13 MED ORDER — SODIUM CHLORIDE 0.9 % IV SOLN
1.0500 mg/kg | Freq: Once | INTRAVENOUS | Status: AC
  Administered 2021-01-13: 60 mg via INTRAVENOUS
  Filled 2021-01-13: qty 15

## 2021-01-13 MED ORDER — DIPHENHYDRAMINE HCL 25 MG PO CAPS
25.0000 mg | ORAL_CAPSULE | Freq: Once | ORAL | Status: AC
  Administered 2021-01-13: 25 mg via ORAL

## 2021-01-13 MED ORDER — DIPHENHYDRAMINE HCL 25 MG PO CAPS
ORAL_CAPSULE | ORAL | Status: AC
  Filled 2021-01-13: qty 1

## 2021-01-13 MED ORDER — ACETAMINOPHEN 325 MG PO TABS
650.0000 mg | ORAL_TABLET | Freq: Once | ORAL | Status: AC
  Administered 2021-01-13: 650 mg via ORAL

## 2021-01-13 MED ORDER — METHYLPREDNISOLONE SODIUM SUCC 125 MG IJ SOLR
125.0000 mg | Freq: Once | INTRAMUSCULAR | Status: AC
  Administered 2021-01-13: 125 mg via INTRAVENOUS

## 2021-01-13 MED ORDER — ACETAMINOPHEN 325 MG PO TABS
ORAL_TABLET | ORAL | Status: AC
  Filled 2021-01-13: qty 2

## 2021-01-13 MED ORDER — SODIUM CHLORIDE 0.9 % IV SOLN
INTRAVENOUS | Status: DC
  Filled 2021-01-13: qty 250

## 2021-01-13 MED ORDER — PREDNISONE 50 MG PO TABS
ORAL_TABLET | ORAL | 0 refills | Status: DC
Start: 2021-01-13 — End: 2021-09-06

## 2021-01-13 MED ORDER — METHYLPREDNISOLONE SODIUM SUCC 125 MG IJ SOLR
INTRAMUSCULAR | Status: AC
  Filled 2021-01-13: qty 2

## 2021-01-13 NOTE — Progress Notes (Signed)
[place] Note  Spoke with Ms Pinales by phone and got her signed up for [facility] at 1:00 on Friday 3/25 per her request.   Pauline Good, [facility] Pager [phone] Voicemail [phone]

## 2021-01-13 NOTE — Patient Instructions (Signed)
[place] Discharge Instructions for Patients Receiving Chemotherapy  Today you received the following chemotherapy agents: mogamulizumab-kpkc  To help prevent nausea and vomiting after your treatment, we encourage you to take your nausea medications needed  If you develop nausea and vomiting that is not controlled by your nausea medication, call the clinic.   BELOW ARE SYMPTOMS THAT SHOULD BE REPORTED IMMEDIATELY:  *FEVER GREATER THAN 100.5 F  *CHILLS WITH OR WITHOUT FEVER  NAUSEA AND VOMITING THAT IS NOT CONTROLLED WITH YOUR NAUSEA MEDICATION  *UNUSUAL SHORTNESS OF BREATH  *UNUSUAL BRUISING OR BLEEDING  TENDERNESS IN MOUTH AND THROAT WITH OR WITHOUT PRESENCE OF ULCERS  *URINARY PROBLEMS  *BOWEL PROBLEMS  UNUSUAL RASH Items with * indicate a potential emergency and should be followed up as soon as possible.  Feel free to call the clinic should you have any questions or concerns. The clinic phone number is [phone].  Please show the [place] at check-in to the Emergency Department and triage nurse.

## 2021-01-25 ENCOUNTER — Telehealth: Payer: Self-pay | Admitting: *Deleted

## 2021-01-25 NOTE — Telephone Encounter (Signed)
Pt left a message stating Dr Lanier Ensign at [facility] is wanting her to have monthly labs. She was wanting to know if you have heard from Dr Lanier Ensign about this. She has appt here on Wednesday and would like them drawn.   Flow Cytometry and Sezary cell are the labs she needs drawn.

## 2021-01-26 ENCOUNTER — Telehealth: Payer: Self-pay | Admitting: Hematology

## 2021-01-26 ENCOUNTER — Other Ambulatory Visit: Payer: Self-pay | Admitting: Hematology

## 2021-01-26 DIAGNOSIS — C8408 Mycosis fungoides, lymph nodes of multiple sites: Secondary | ICD-10-CM

## 2021-01-26 NOTE — Progress Notes (Signed)
HEMATOLOGY/ONCOLOGY CONSULTATION NOTE  Date of Service: 01/27/2021  Patient Care Team: Chesley Noon, MD as PCP - General (Family Medicine)  CHIEF COMPLAINTS/PURPOSE OF CONSULTATION:  Mycosis fungoides  HISTORY OF PRESENTING ILLNESS:   Alexa [name] is a wonderful 70 y.o. 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 The pt reports that she is doing well overall.   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 01/2016, she noticed a red patch on her left posterior calf which prompted presentation to her local dermatologist, Dr. Tonia Brooms in [place].  B. 02/14/16 punch biopsy was done of a pink-brown plaque on the right anterior thigh that showed ALI and was suspicious for CTCL, but did not confirm the diagnosis of CTCL/MF. Clobetasol prescribed and was effective until 03/2017 and was started on MTX (10mg  weekly) by Dr. Shaaron Adler.  C. 05/25/20-09/14/20 6 cycles of brentuximab D. 11/09/20 C1D1 Mogamulizumab".   Also from [facility], "2. Rectal carcinoma  A. 05/19/17 CT C/A/P was unremarkable and showed no evidence for metastatic disease B. 06/09/17 Patient had recent rectal polyp ectomy which revealed cancer within the specimen. Subsequent transanal excision  of the polypectomy site was performed 06/09/2017. Negative margins at the time of mucosal resection."  The pt reports that she is seeing Dr. Lanier Ensign at [facility]. She believes the spike in T-cells is due to the vaccination. She has the CD30 receptor that Brentuximab targets. She started with the Brentuximab in July for 6 treatments and then Mogamulizumab. This all started with a skin rash on her lower body. She notes her last CT scan was around November of last year, and denies enlarged lymph nodes. 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 This, in addition to neuropathy, is what influenced the switch to Mogamulizumab. This has not helped much, according to her, but has also had no side effects.   The pt notes that her skin involvement is as bad as it has been in a long time, and covers her entire body except the face and head regions. She goes for her fourth treatment tomorrow. She wishes to get infusions done at [facility] following the sixth cycle, but keep her doctors at [facility].  The pt notes that the itching goes away during night, but is bothersome during day since treatments has started. She claims this is very distracting.   The pt noted that she did not use the Benadryl with first treatments of Bentruximab, which caused her to lose her hair and experience harsh side effects.  The pt notes that she takes Zyrtec and Pepcid daily. She is active and works out many times a week. She goes to church, does pilates, cooks, daily walks, and notes no fatigue or weakness.  She desires for a small dose of steroids in her pre-treatment medications.   The pt notes that she started having heart palpitations in November 2021. She was given Metopolol to help with this. There was nothing on her two-week monitor that was of concern, she notes. She notes there are no major palpitations currently. This all started with C6 Brentruximab.  The pt notes  that she had a polyp removed in 2018 (Transanal Excision of Rectal Mass). She continues to follow up as needed with Dr. Marcello Moores for this.   She claims Oxycodone makes her extremely nauseous and never wishes to take this again.  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 She note this was a very stressful work environment.  On  review of systems, pt reports skin rashes on all her body and irregular nightsweats except head/face, slight neuropathy, itching, tingling and denies n/v/d, chills, unexpected weight loss, fatigue, weakness, belly pain, abdominal pain, leg swelling and any other symptoms.  [place] is a wonderful 70 y.o. 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 The patient's last visit with [place] was on 01/13/2021 . The pt reports that she is doing well overall. She is here for C3D15 Mogamulizumab.   The pt reports that she had another flare up, but this resolved on its own. The pt took the Prednisone early as well, for 5 days. This flare occurred a day or two after finishing the Prednisone and lasted for a few days. She notes that the skin reactions fluctuate between being 25-50% better than her baseline prior to staring Mogamulizumab. The pt notes that she is worried about her neuropathy, which is overall weakness and tingling to touch. The pt notes some intermittent lightheadedness that was slightly improved after Prednisone, but still consistent during this. The pt notes that she will be following up with Dr. Lanier Ensign every three months at this time. The pt notes some muscle weakness, but not significant or bothersome to the patient. The pt notes that Dr. Lanier Ensign has a phase 1 trial coming up soon and she is considering this, but undecided at this time.  Lab results today 01/27/2021 of CBC w/diff and CMP is as follows: all values are WNL except for Lymphs Abs of 0.6K. CMP pending. 01/27/2021 LDH pending. 01/27/2021 Flow Cytometry in progress.  On review of systems, pt reports flare up, intermittent lightheadedness, neuropathy of hands/feet and denies itchiness, syncopes, mouth sores, swallowing issues, changes in bowel habits, SOB, chest pain, abdominal pain, swollen/painful joints, leg swelling, and any other symptoms.  MEDICAL HISTORY:   Past Medical History:  Diagnosis Date  . Adenomatous polyp   . Cancer ([place])    cutaneous T-cell lymphoma; followed by Dr. Clovis Riley @ [facility]  . Family history of anesthesia complication    sister and sister's children postitive for [employer]!!!!!!, pt tested negative 3-29yrs ago  . GERD (gastroesophageal reflux disease)   . History of hypertension    off medication after weight loss  . Hyperlipemia   . Lymphoma ([place])    light box therapy  . 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[place])   . Osteopenia    bilateral hips  . Rectal cancer ([place]) 04/2017    SURGICAL HISTORY: Past Surgical History:  Procedure Laterality Date  . COLONOSCOPY  5/13   with biopsy  . DILATATION & CURRETTAGE/HYSTEROSCOPY WITH RESECTOCOPE N/A 06/02/2014   Procedure: [place];  Surgeon: Lyman Speller, MD;  Location: [facility];  Service: Gynecology;  Laterality: N/A;  . FOOT SURGERY     Right   . TRANSANAL EXCISION OF RECTAL MASS N/A 06/09/2017   Procedure: TRANSANAL EXCISION OF RECTAL MASS;  Surgeon: [name], MD;  Location: [facility];  Service: General;  Laterality: N/A;  SOCIAL HISTORY: Social History   Socioeconomic History  . Marital status: Single    Spouse name: Not on file  . Number of children: 0  . Years of education: Not on file  . Highest education level: Not on file  Occupational History  . Not on file  Tobacco Use  . Smoking status: Never Smoker  . Smokeless tobacco: Never Used  Vaping Use  . Vaping Use: Never used  Substance and Sexual Activity  . Alcohol use: Yes    Alcohol/week: 0.0 - 1.0 standard drinks  . Drug use: No  . Sexual activity: Not Currently    Partners: Male    Birth control/protection: Post-menopausal  Other Topics Concern  . Not on file  Social History Narrative  . Not on file   Social Determinants of Health   Financial Resource Strain: Not on file  Food Insecurity: Not on file  Transportation Needs: Not on file   Physical Activity: Not on file  Stress: Not on file  Social Connections: Not on file  Intimate Partner Violence: Not on file    FAMILY HISTORY: Family History  Problem Relation Age of Onset  . Anuerysm Father   . Heart disease Father   . Diabetes Father   . Cancer Father        unknown type  . Hypertension Father   . Heart attack Mother   . Heart disease Mother   . Kidney disease Mother   . Kidney cancer Mother        contained tumor  . Hypertension Mother   . Heart attack Brother   . Cancer Brother        liver cancer  . Melanoma Brother   . Other Brother        Bypass surgery   . Hypertension Sister   . Ovarian cancer Paternal Grandmother        was PMP  . Colon cancer Neg Hx     ALLERGIES:  is allergic to diphenhydramine hcl and sudafed [pseudoephedrine hcl].  MEDICATIONS:  Current Outpatient Medications  Medication Sig Dispense Refill  . amLODipine (NORVASC) 5 MG tablet Take 1 tablet (5 mg total) by mouth daily. 90 tablet 3  . B Complex Vitamins (VITAMIN B COMPLEX) TABS Take by mouth. 5000 UT    . Biotin 5000 MCG CAPS     . cetirizine (ZYRTEC) 10 MG tablet Take 10 mg by mouth daily.    . Cholecalciferol (VITAMIN D) 125 MCG (5000 UT) CAPS     . Cod Liver Oil 1000 MG CAPS Take by mouth.    . ELDERBERRY PO Take by mouth daily.    . famotidine (PEPCID) 20 MG tablet Take 20 mg by mouth 2 (two) times daily.    . metoprolol tartrate (LOPRESSOR) 25 MG tablet Take 0.5 tablets (12.5 mg total) by mouth daily.    . Nutritional Supplements (CHLORELLA-SPIRULINA COMPLEX) TABS Take 1 tablet by mouth daily.    . predniSONE (DELTASONE) 50 MG tablet Take 1 tab (50mg ) orally with breakfast for 5 days starting the morning after each Mogamulizumab treatment 30 tablet 0  . sertraline (ZOLOFT) 25 MG tablet Take by mouth.    . sodium chloride 0.9 % SOLN 250 mL with mogamulizumab-kpkc 20 MG/5ML SOLN 1 mg/kg Inject 1 mg/kg into the vein.    Marland Kitchen triamcinolone cream (KENALOG) 0.1 %     .  UNABLE TO FIND [place]     No current facility-administered medications for this visit.  REVIEW OF SYSTEMS:   10 Point review of Systems was done is negative except as noted above.  PHYSICAL EXAMINATION: ECOG PERFORMANCE STATUS: 1 - Symptomatic but completely ambulatory  . Vitals:   01/27/21 0920  BP: 129/66  Pulse: 69  Resp: 18  Temp: (!) 96.6 F (35.9 C)  SpO2: 100%   Filed Weights   01/27/21 0920  Weight: 131 lb 1.6 oz (59.5 kg)   .Body mass index is 23.98 kg/m.  GENERAL:alert, in no acute distress and comfortable SKIN: no acute rashes, no significant lesions EYES: conjunctiva are pink and non-injected, sclera anicteric OROPHARYNX: MMM, no exudates, no oropharyngeal erythema or ulceration NECK: supple, no JVD LYMPH:  no palpable lymphadenopathy in the cervical, axillary or inguinal regions LUNGS: clear to auscultation b/l with normal respiratory effort HEART: regular rate & rhythm ABDOMEN:  normoactive bowel sounds , non tender, not distended. Extremity: no pedal edema PSYCH: alert & oriented x 3 with fluent speech NEURO: no focal motor/sensory deficits  LABORATORY DATA:  I have reviewed the data as listed  . CBC Latest Ref Rng & Units 01/27/2021 01/13/2021 06/09/2017  WBC 4.0 - 10.5 K/uL 7.6 5.6 -  Hemoglobin 12.0 - 15.0 g/dL 14.6 14.6 13.9  Hematocrit 36.0 - 46.0 % 42.4 41.1 41.0  Platelets 150 - 400 K/uL 182 192 -    . CMP Latest Ref Rng & Units 01/27/2021 01/13/2021 06/09/2017  Glucose 70 - 99 mg/dL 110(H) 103(H) 101(H)  BUN 8 - 23 mg/dL 25(H) 19 -  Creatinine 0.44 - 1.00 mg/dL 1.01(H) 0.98 -  Sodium 135 - 145 mmol/L 142 139 144  Potassium 3.5 - 5.1 mmol/L 4.2 4.1 3.8  Chloride 98 - 111 mmol/L 106 104 -  CO2 22 - 32 mmol/L 23 24 -  Calcium 8.9 - 10.3 mg/dL 9.1 9.0 -  Total Protein 6.5 - 8.1 g/dL 7.3 7.5 -  Total Bilirubin 0.3 - 1.2 mg/dL 0.7 0.9 -  Alkaline Phos 38 - 126 U/L 78 80 -  AST 15 - 41 U/L 23 27 -  ALT 0 - 44 U/L 21 28 -      RADIOGRAPHIC STUDIES: I have personally reviewed the radiological images as listed and agreed with the findings in the report. No results found.  ASSESSMENT & PLAN:   70 yo with   1) CTCL with previous treatment as noted above.  PLAN: -Discussed pt labwork today, 01/27/2021; blood counts normal except mild lymphocytopenia, Other labs pending. -Advised pt that the goal is currently quality of life and tolerating treatment. -Recommended pt continue to take the Prednisone one day after treatment for 4 days.  -Switch Prednisone from 5 days to 4 days due to muscle weakness and neuropathy. Will continue to try to back off one day at time pending toleration. -Advised pt that Prednisone can cause some swelling of the hands and feet. -Advised pt that participating in the Phase 1 trial with Dr. Lanier Ensign is her personal choice and up to the pt's discretion and wishes. -Recommended pt drink 48-64 oz water daily. -Recommended use of yoga and meditative breathing exercises to ease anxiety and calm the mind and body. -Continue Singulair once daily.  -Continue Pepcid and Zyrtec. Benadryl in addition if flare-up prn. -Will see back on 03/10/2021.    FOLLOW UP: F/u as per currently scheduled appointments   All of the patients questions were answered with apparent satisfaction. The patient knows to call the clinic with any problems, questions or concerns.  The total time spent  in the appointment was 30 minutes and more than 50% was on counseling and direct patient cares, ordering and management of chemotherapy.     Sullivan Lone MD MS AAHIVMS [facility] Hematology/Oncology Physician [facility]  (Office):       [phone] (Work cell):  [phone] (Fax):           [phone]  I have reviewed the above documentation for accuracy and completeness, and I agree with the above.   01/27/2021 9:21 AM  I, Reinaldo Raddle, am acting as scribe for Dr. Sullivan Lone, MD.   .I have  reviewed the above documentation for accuracy and completeness, and I agree with the above. Brunetta Genera MD

## 2021-01-26 NOTE — Progress Notes (Signed)
Flow cytometry for Sezary  t cell  Subset monitoring ordered.  Alexa [name]

## 2021-01-26 NOTE — Telephone Encounter (Signed)
Moved provider visit due to provider's PAL. Patient is aware of changes.

## 2021-01-27 ENCOUNTER — Ambulatory Visit: Payer: Medicare Other

## 2021-01-27 ENCOUNTER — Other Ambulatory Visit: Payer: Medicare Other

## 2021-01-27 ENCOUNTER — Inpatient Hospital Stay: Payer: Medicare Other

## 2021-01-27 ENCOUNTER — Inpatient Hospital Stay (HOSPITAL_BASED_OUTPATIENT_CLINIC_OR_DEPARTMENT_OTHER): Payer: Medicare Other | Admitting: Hematology

## 2021-01-27 ENCOUNTER — Other Ambulatory Visit: Payer: Self-pay

## 2021-01-27 ENCOUNTER — Other Ambulatory Visit: Payer: Self-pay | Admitting: *Deleted

## 2021-01-27 ENCOUNTER — Telehealth: Payer: Self-pay | Admitting: Hematology

## 2021-01-27 VITALS — BP 129/66 | HR 69 | Temp 96.6°F | Resp 18 | Ht 62.0 in | Wt 131.1 lb

## 2021-01-27 DIAGNOSIS — Z5111 Encounter for antineoplastic chemotherapy: Secondary | ICD-10-CM | POA: Diagnosis not present

## 2021-01-27 DIAGNOSIS — C8590 Non-Hodgkin lymphoma, unspecified, unspecified site: Secondary | ICD-10-CM

## 2021-01-27 DIAGNOSIS — C8408 Mycosis fungoides, lymph nodes of multiple sites: Secondary | ICD-10-CM

## 2021-01-27 DIAGNOSIS — Z7189 Other specified counseling: Secondary | ICD-10-CM

## 2021-01-27 DIAGNOSIS — Z5112 Encounter for antineoplastic immunotherapy: Secondary | ICD-10-CM | POA: Diagnosis not present

## 2021-01-27 LAB — CBC WITH DIFFERENTIAL (CANCER CENTER ONLY)
Abs Immature Granulocytes: 0.04 10*3/uL (ref 0.00–0.07)
Basophils Absolute: 0 10*3/uL (ref 0.0–0.1)
Basophils Relative: 1 %
Eosinophils Absolute: 0.2 10*3/uL (ref 0.0–0.5)
Eosinophils Relative: 2 %
HCT: 42.4 % (ref 36.0–46.0)
Hemoglobin: 14.6 g/dL (ref 12.0–15.0)
Immature Granulocytes: 1 %
Lymphocytes Relative: 8 %
Lymphs Abs: 0.6 10*3/uL — ABNORMAL LOW (ref 0.7–4.0)
MCH: 32.8 pg (ref 26.0–34.0)
MCHC: 34.4 g/dL (ref 30.0–36.0)
MCV: 95.3 fL (ref 80.0–100.0)
Monocytes Absolute: 0.5 10*3/uL (ref 0.1–1.0)
Monocytes Relative: 6 %
Neutro Abs: 6.3 10*3/uL (ref 1.7–7.7)
Neutrophils Relative %: 82 %
Platelet Count: 182 10*3/uL (ref 150–400)
RBC: 4.45 MIL/uL (ref 3.87–5.11)
RDW: 11.9 % (ref 11.5–15.5)
WBC Count: 7.6 10*3/uL (ref 4.0–10.5)
nRBC: 0 % (ref 0.0–0.2)

## 2021-01-27 LAB — CMP (CANCER CENTER ONLY)
ALT: 21 U/L (ref 0–44)
AST: 23 U/L (ref 15–41)
Albumin: 3.9 g/dL (ref 3.5–5.0)
Alkaline Phosphatase: 78 U/L (ref 38–126)
Anion gap: 13 (ref 5–15)
BUN: 25 mg/dL — ABNORMAL HIGH (ref 8–23)
CO2: 23 mmol/L (ref 22–32)
Calcium: 9.1 mg/dL (ref 8.9–10.3)
Chloride: 106 mmol/L (ref 98–111)
Creatinine: 1.01 mg/dL — ABNORMAL HIGH (ref 0.44–1.00)
GFR, Estimated: >60 mL/min (ref >60)
Glucose, Bld: 110 mg/dL — ABNORMAL HIGH (ref 70–99)
Potassium: 4.2 mmol/L (ref 3.5–5.1)
Sodium: 142 mmol/L (ref 135–145)
Total Bilirubin: 0.7 mg/dL (ref 0.3–1.2)
Total Protein: 7.3 g/dL (ref 6.5–8.1)

## 2021-01-27 LAB — SURGICAL PATHOLOGY

## 2021-01-27 LAB — LACTATE DEHYDROGENASE: LDH: 231 U/L — ABNORMAL HIGH (ref 98–192)

## 2021-01-27 MED ORDER — DIPHENHYDRAMINE HCL 25 MG PO CAPS
25.0000 mg | ORAL_CAPSULE | Freq: Once | ORAL | Status: AC
  Administered 2021-01-27: 25 mg via ORAL

## 2021-01-27 MED ORDER — METHYLPREDNISOLONE SODIUM SUCC 125 MG IJ SOLR
125.0000 mg | Freq: Once | INTRAMUSCULAR | Status: AC
Start: 2021-01-27 — End: 2021-01-27
  Administered 2021-01-27: 125 mg via INTRAVENOUS

## 2021-01-27 MED ORDER — ACETAMINOPHEN 325 MG PO TABS
ORAL_TABLET | ORAL | Status: AC
  Filled 2021-01-27: qty 2

## 2021-01-27 MED ORDER — ACETAMINOPHEN 325 MG PO TABS
650.0000 mg | ORAL_TABLET | Freq: Once | ORAL | Status: AC
  Administered 2021-01-27: 650 mg via ORAL

## 2021-01-27 MED ORDER — DIPHENHYDRAMINE HCL 25 MG PO CAPS
ORAL_CAPSULE | ORAL | Status: AC
  Filled 2021-01-27: qty 1

## 2021-01-27 MED ORDER — SODIUM CHLORIDE 0.9 % IV SOLN
60.0000 mg | Freq: Once | INTRAVENOUS | Status: AC
  Administered 2021-01-27: 60 mg via INTRAVENOUS
  Filled 2021-01-27: qty 15

## 2021-01-27 MED ORDER — SODIUM CHLORIDE 0.9 % IV SOLN
INTRAVENOUS | Status: DC
  Filled 2021-01-27: qty 250

## 2021-01-27 MED ORDER — METHYLPREDNISOLONE SODIUM SUCC 125 MG IJ SOLR
INTRAMUSCULAR | Status: AC
  Filled 2021-01-27: qty 2

## 2021-01-27 NOTE — Telephone Encounter (Signed)
Checked out appointment. No LOS notes needing to be scheduled. No changes made. 

## 2021-01-28 LAB — FLOW CYTOMETRY

## 2021-01-29 ENCOUNTER — Other Ambulatory Visit: Payer: Medicare Other | Admitting: Licensed Clinical Social Worker

## 2021-02-02 ENCOUNTER — Other Ambulatory Visit: Payer: Self-pay | Admitting: Hematology

## 2021-02-02 ENCOUNTER — Telehealth: Payer: Self-pay | Admitting: *Deleted

## 2021-02-02 NOTE — Telephone Encounter (Signed)
Received fax from Dr. Adelene Idler at [facility] with request: "Please monitor patient with flow cytometry and Sezary Cell Analysis every other week."  Dr. Irene Limbo informed.  Flow Cytometry and Cell analysis ordered by Dr. Irene Limbo 01/27/21: Per Dr. Irene Limbo -  Lab used by [facility] does not subgate the specialties, will not be able to complete this request due to lab limitations. Please let patient and Dr. Irish Elders know.  Patient informed - she states she will notify Dr. Lanier Ensign. Response faxed to Dr. Irish Elders with all information as noted. Fax confirmation received.  Fax sent to HIM to be scanned

## 2021-02-10 ENCOUNTER — Inpatient Hospital Stay: Payer: Medicare Other

## 2021-02-10 ENCOUNTER — Inpatient Hospital Stay: Payer: Medicare Other | Attending: Hematology

## 2021-02-10 ENCOUNTER — Other Ambulatory Visit: Payer: Self-pay

## 2021-02-10 ENCOUNTER — Encounter: Payer: Self-pay | Admitting: Hematology

## 2021-02-10 ENCOUNTER — Other Ambulatory Visit: Payer: Self-pay | Admitting: *Deleted

## 2021-02-10 VITALS — BP 139/67 | HR 62 | Temp 98.6°F | Resp 16

## 2021-02-10 DIAGNOSIS — C8408 Mycosis fungoides, lymph nodes of multiple sites: Secondary | ICD-10-CM

## 2021-02-10 DIAGNOSIS — C9150 Adult T-cell lymphoma/leukemia (HTLV-1-associated) not having achieved remission: Secondary | ICD-10-CM | POA: Insufficient documentation

## 2021-02-10 DIAGNOSIS — Z7189 Other specified counseling: Secondary | ICD-10-CM

## 2021-02-10 DIAGNOSIS — Z5112 Encounter for antineoplastic immunotherapy: Secondary | ICD-10-CM | POA: Insufficient documentation

## 2021-02-10 DIAGNOSIS — C8590 Non-Hodgkin lymphoma, unspecified, unspecified site: Secondary | ICD-10-CM

## 2021-02-10 LAB — CBC WITH DIFFERENTIAL (CANCER CENTER ONLY)
Abs Immature Granulocytes: 0.04 10*3/uL (ref 0.00–0.07)
Basophils Absolute: 0 10*3/uL (ref 0.0–0.1)
Basophils Relative: 1 %
Eosinophils Absolute: 0.1 10*3/uL (ref 0.0–0.5)
Eosinophils Relative: 2 %
HCT: 42.6 % (ref 36.0–46.0)
Hemoglobin: 14.7 g/dL (ref 12.0–15.0)
Immature Granulocytes: 1 %
Lymphocytes Relative: 10 %
Lymphs Abs: 0.7 10*3/uL (ref 0.7–4.0)
MCH: 33.2 pg (ref 26.0–34.0)
MCHC: 34.5 g/dL (ref 30.0–36.0)
MCV: 96.2 fL (ref 80.0–100.0)
Monocytes Absolute: 0.6 10*3/uL (ref 0.1–1.0)
Monocytes Relative: 8 %
Neutro Abs: 5.5 10*3/uL (ref 1.7–7.7)
Neutrophils Relative %: 78 %
Platelet Count: 185 10*3/uL (ref 150–400)
RBC: 4.43 MIL/uL (ref 3.87–5.11)
RDW: 12 % (ref 11.5–15.5)
WBC Count: 7 10*3/uL (ref 4.0–10.5)
nRBC: 0 % (ref 0.0–0.2)

## 2021-02-10 LAB — CMP (CANCER CENTER ONLY)
ALT: 20 U/L (ref 0–44)
AST: 22 U/L (ref 15–41)
Albumin: 4.1 g/dL (ref 3.5–5.0)
Alkaline Phosphatase: 75 U/L (ref 38–126)
Anion gap: 12 (ref 5–15)
BUN: 19 mg/dL (ref 8–23)
CO2: 25 mmol/L (ref 22–32)
Calcium: 9.1 mg/dL (ref 8.9–10.3)
Chloride: 103 mmol/L (ref 98–111)
Creatinine: 1 mg/dL (ref 0.44–1.00)
GFR, Estimated: >60 mL/min (ref >60)
Glucose, Bld: 95 mg/dL (ref 70–99)
Potassium: 4.1 mmol/L (ref 3.5–5.1)
Sodium: 140 mmol/L (ref 135–145)
Total Bilirubin: 0.9 mg/dL (ref 0.3–1.2)
Total Protein: 7.4 g/dL (ref 6.5–8.1)

## 2021-02-10 LAB — LACTATE DEHYDROGENASE: LDH: 225 U/L — ABNORMAL HIGH (ref 98–192)

## 2021-02-10 MED ORDER — ACETAMINOPHEN 325 MG PO TABS
650.0000 mg | ORAL_TABLET | Freq: Once | ORAL | Status: AC
  Administered 2021-02-10: 650 mg via ORAL

## 2021-02-10 MED ORDER — DIPHENHYDRAMINE HCL 25 MG PO CAPS
ORAL_CAPSULE | ORAL | Status: AC
  Filled 2021-02-10: qty 1

## 2021-02-10 MED ORDER — ACETAMINOPHEN 325 MG PO TABS
ORAL_TABLET | ORAL | Status: AC
  Filled 2021-02-10: qty 2

## 2021-02-10 MED ORDER — MOGAMULIZUMAB-KPKC CHEMO INJECTIION 20 MG/5ML
60.0000 mg | Freq: Once | INTRAVENOUS | Status: AC
  Administered 2021-02-10: 60 mg via INTRAVENOUS
  Filled 2021-02-10: qty 15

## 2021-02-10 MED ORDER — METHYLPREDNISOLONE SODIUM SUCC 125 MG IJ SOLR
INTRAMUSCULAR | Status: AC
  Filled 2021-02-10: qty 2

## 2021-02-10 MED ORDER — METHYLPREDNISOLONE SODIUM SUCC 125 MG IJ SOLR
125.0000 mg | Freq: Once | INTRAMUSCULAR | Status: AC
Start: 2021-02-10 — End: 2021-02-10
  Administered 2021-02-10: 125 mg via INTRAVENOUS

## 2021-02-10 MED ORDER — DIPHENHYDRAMINE HCL 25 MG PO CAPS
25.0000 mg | ORAL_CAPSULE | Freq: Once | ORAL | Status: AC
  Administered 2021-02-10: 25 mg via ORAL

## 2021-02-10 NOTE — Patient Instructions (Signed)
[place] Discharge Instructions for Patients Receiving Chemotherapy  Today you received the following chemotherapy agents: [place]  To help prevent nausea and vomiting after your treatment, we encourage you to take your nausea medication as directed.    If you develop nausea and vomiting that is not controlled by your nausea medication, call the clinic.   BELOW ARE SYMPTOMS THAT SHOULD BE REPORTED IMMEDIATELY:  *FEVER GREATER THAN 100.5 F  *CHILLS WITH OR WITHOUT FEVER  NAUSEA AND VOMITING THAT IS NOT CONTROLLED WITH YOUR NAUSEA MEDICATION  *UNUSUAL SHORTNESS OF BREATH  *UNUSUAL BRUISING OR BLEEDING  TENDERNESS IN MOUTH AND THROAT WITH OR WITHOUT PRESENCE OF ULCERS  *URINARY PROBLEMS  *BOWEL PROBLEMS  UNUSUAL RASH Items with * indicate a potential emergency and should be followed up as soon as possible.  Feel free to call the clinic should you have any questions or concerns. The clinic phone number is [phone].  Please show the [place] at check-in to the Emergency Department and triage nurse.

## 2021-02-23 ENCOUNTER — Other Ambulatory Visit: Payer: Self-pay

## 2021-02-23 DIAGNOSIS — C8590 Non-Hodgkin lymphoma, unspecified, unspecified site: Secondary | ICD-10-CM

## 2021-02-24 ENCOUNTER — Inpatient Hospital Stay: Payer: Medicare Other

## 2021-02-24 ENCOUNTER — Other Ambulatory Visit: Payer: Self-pay

## 2021-02-24 ENCOUNTER — Ambulatory Visit: Payer: Medicare Other | Admitting: Hematology

## 2021-02-24 VITALS — BP 121/76 | HR 76 | Temp 98.4°F | Resp 16 | Wt 131.0 lb

## 2021-02-24 DIAGNOSIS — C8408 Mycosis fungoides, lymph nodes of multiple sites: Secondary | ICD-10-CM

## 2021-02-24 DIAGNOSIS — Z5112 Encounter for antineoplastic immunotherapy: Secondary | ICD-10-CM | POA: Diagnosis not present

## 2021-02-24 DIAGNOSIS — C8590 Non-Hodgkin lymphoma, unspecified, unspecified site: Secondary | ICD-10-CM

## 2021-02-24 DIAGNOSIS — Z7189 Other specified counseling: Secondary | ICD-10-CM

## 2021-02-24 LAB — CBC WITH DIFFERENTIAL (CANCER CENTER ONLY)
Abs Immature Granulocytes: 0.03 10*3/uL (ref 0.00–0.07)
Basophils Absolute: 0 10*3/uL (ref 0.0–0.1)
Basophils Relative: 1 %
Eosinophils Absolute: 0.1 10*3/uL (ref 0.0–0.5)
Eosinophils Relative: 3 %
HCT: 42.9 % (ref 36.0–46.0)
Hemoglobin: 15.2 g/dL — ABNORMAL HIGH (ref 12.0–15.0)
Immature Granulocytes: 1 %
Lymphocytes Relative: 18 %
Lymphs Abs: 0.9 10*3/uL (ref 0.7–4.0)
MCH: 33.6 pg (ref 26.0–34.0)
MCHC: 35.4 g/dL (ref 30.0–36.0)
MCV: 94.9 fL (ref 80.0–100.0)
Monocytes Absolute: 0.5 10*3/uL (ref 0.1–1.0)
Monocytes Relative: 10 %
Neutro Abs: 3.3 10*3/uL (ref 1.7–7.7)
Neutrophils Relative %: 67 %
Platelet Count: 188 10*3/uL (ref 150–400)
RBC: 4.52 MIL/uL (ref 3.87–5.11)
RDW: 12 % (ref 11.5–15.5)
WBC Count: 4.9 10*3/uL (ref 4.0–10.5)
nRBC: 0 % (ref 0.0–0.2)

## 2021-02-24 LAB — CMP (CANCER CENTER ONLY)
ALT: 16 U/L (ref 0–44)
AST: 21 U/L (ref 15–41)
Albumin: 4 g/dL (ref 3.5–5.0)
Alkaline Phosphatase: 71 U/L (ref 38–126)
Anion gap: 10 (ref 5–15)
BUN: 18 mg/dL (ref 8–23)
CO2: 24 mmol/L (ref 22–32)
Calcium: 9.1 mg/dL (ref 8.9–10.3)
Chloride: 105 mmol/L (ref 98–111)
Creatinine: 1.01 mg/dL — ABNORMAL HIGH (ref 0.44–1.00)
GFR, Estimated: >60 mL/min (ref >60)
Glucose, Bld: 97 mg/dL (ref 70–99)
Potassium: 4.3 mmol/L (ref 3.5–5.1)
Sodium: 139 mmol/L (ref 135–145)
Total Bilirubin: 0.9 mg/dL (ref 0.3–1.2)
Total Protein: 7.1 g/dL (ref 6.5–8.1)

## 2021-02-24 MED ORDER — ACETAMINOPHEN 325 MG PO TABS
650.0000 mg | ORAL_TABLET | Freq: Once | ORAL | Status: AC
  Administered 2021-02-24: 650 mg via ORAL

## 2021-02-24 MED ORDER — SODIUM CHLORIDE 0.9 % IV SOLN
INTRAVENOUS | Status: DC
  Filled 2021-02-24: qty 250

## 2021-02-24 MED ORDER — DIPHENHYDRAMINE HCL 25 MG PO CAPS
ORAL_CAPSULE | ORAL | Status: AC
  Filled 2021-02-24: qty 1

## 2021-02-24 MED ORDER — METHYLPREDNISOLONE SODIUM SUCC 125 MG IJ SOLR
125.0000 mg | Freq: Once | INTRAMUSCULAR | Status: AC
  Administered 2021-02-24: 125 mg via INTRAVENOUS

## 2021-02-24 MED ORDER — ACETAMINOPHEN 325 MG PO TABS
ORAL_TABLET | ORAL | Status: AC
  Filled 2021-02-24: qty 2

## 2021-02-24 MED ORDER — METHYLPREDNISOLONE SODIUM SUCC 125 MG IJ SOLR
INTRAMUSCULAR | Status: AC
  Filled 2021-02-24: qty 2

## 2021-02-24 MED ORDER — MOGAMULIZUMAB-KPKC CHEMO INJECTIION 20 MG/5ML
60.0000 mg | Freq: Once | INTRAVENOUS | Status: AC
  Administered 2021-02-24: 60 mg via INTRAVENOUS
  Filled 2021-02-24: qty 15

## 2021-02-24 MED ORDER — DIPHENHYDRAMINE HCL 25 MG PO CAPS
25.0000 mg | ORAL_CAPSULE | Freq: Once | ORAL | Status: AC
  Administered 2021-02-24: 25 mg via ORAL

## 2021-02-24 NOTE — Patient Instructions (Signed)
[place] Discharge Instructions for Patients Receiving Chemotherapy  Today you received the following chemotherapy agents mogamulizumab-kpkc  To help prevent nausea and vomiting after your treatment, we encourage you to take your nausea medication as directed.   If you develop nausea and vomiting that is not controlled by your nausea medication, call the clinic.   BELOW ARE SYMPTOMS THAT SHOULD BE REPORTED IMMEDIATELY:  *FEVER GREATER THAN 100.5 F  *CHILLS WITH OR WITHOUT FEVER  NAUSEA AND VOMITING THAT IS NOT CONTROLLED WITH YOUR NAUSEA MEDICATION  *UNUSUAL SHORTNESS OF BREATH  *UNUSUAL BRUISING OR BLEEDING  TENDERNESS IN MOUTH AND THROAT WITH OR WITHOUT PRESENCE OF ULCERS  *URINARY PROBLEMS  *BOWEL PROBLEMS  UNUSUAL RASH Items with * indicate a potential emergency and should be followed up as soon as possible.  Feel free to call the clinic should you have any questions or concerns. The clinic phone number is [phone].  Please show the [place] at check-in to the Emergency Department and triage nurse.

## 2021-02-26 ENCOUNTER — Inpatient Hospital Stay: Payer: Medicare Other | Admitting: General Practice

## 2021-03-09 ENCOUNTER — Other Ambulatory Visit: Payer: Self-pay

## 2021-03-09 DIAGNOSIS — C8590 Non-Hodgkin lymphoma, unspecified, unspecified site: Secondary | ICD-10-CM

## 2021-03-09 NOTE — Progress Notes (Signed)
HEMATOLOGY/ONCOLOGY CLINIC NOTE  Date of Service: 03/10/2021  Patient Care Team: Alexa [name], Alexa C, MD as PCP - General (Family Medicine)  CHIEF COMPLAINTS/PURPOSE OF CONSULTATION:  Mycosis fungoides  HISTORY OF PRESENTING ILLNESS:   Alexa [name] is a wonderful 70 y.o. 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 The pt reports that she is doing well overall.   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 01/2016, she noticed a red patch on her left posterior calf which prompted presentation to her local dermatologist, [name].  B. 02/14/16 punch biopsy was done of a pink-brown plaque on the right anterior thigh that showed ALI and was suspicious for CTCL, but did not confirm the diagnosis of CTCL/MF. Clobetasol prescribed and was effective until 03/2017 and was started on MTX (10mg  weekly) by Dr. Burna [name].  [name]. 05/25/20-09/14/20 6 cycles of brentuximab D. 11/09/20 C1D1 Mogamulizumab".   Also from [facility], "2. Rectal carcinoma  A. 05/19/17 CT [name]/A/P was unremarkable and showed no evidence for metastatic disease B. 06/09/17 Patient had recent rectal polyp ectomy which revealed cancer within the specimen. Subsequent transanal excision  of the polypectomy site was performed 06/09/2017. Negative margins at the time of mucosal resection."  The pt reports that she is [name]. She believes the spike in T-cells is due to the vaccination. She has the CD30 receptor that Brentuximab targets. She started with the Brentuximab in July for 6 treatments and then Mogamulizumab. This all started with a skin rash on her lower body. She notes her last CT scan was around November of last year, and denies enlarged lymph nodes. 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 This, in addition to neuropathy, is what influenced the switch to Mogamulizumab. This has not helped much, according to her, but has also had no side effects.   The pt notes that her skin involvement is as bad as it has been in a long time, and covers her entire body except the face and head regions. She goes for her fourth treatment tomorrow. She wishes to get [facility] following the sixth cycle, but [facility].  The pt notes that the itching goes away during night, but is bothersome during day since treatments has started. She claims this is very distracting.   The pt noted that she did not use the Benadryl with first treatments of Bentruximab, which caused her to lose her hair and experience harsh side effects.  The pt notes that she takes Zyrtec and Pepcid daily. She is active and works out many times a week. She goes to church, does pilates, cooks, daily walks, and notes no fatigue or weakness.  She desires for a small dose of steroids in her pre-treatment medications.   The pt notes that she started having heart palpitations in November 2021. She was given Metopolol to help with this. There was nothing on her two-week monitor that was of concern, she notes. She notes there are no major palpitations currently. This all started with C6 Brentruximab.  The pt notes  that she had a polyp removed in 2018 (Transanal Excision of Rectal Mass). She continues to follow up as needed with Dr. Marcello [name] for this.   She claims Oxycodone makes her extremely nauseous and never wishes to take this again.  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 She note this was a very stressful work environment.  On review of  systems, pt reports skin rashes on all her body and irregular nightsweats except head/face, slight neuropathy, itching, tingling and denies n/v/d, chills, unexpected weight loss, fatigue, weakness, belly pain, abdominal pain, leg swelling and any other symptoms.  INTERVAL HISTORY  Alexa [name] is a wonderful 70 y.o. 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 The patient's last visit with [place] was on 01/13/2021 . The pt reports that she is doing well overall. She is here for C5D1 Mogamulizumab.   The pt reports that she saw Dr. Donzetta Matters on 04/18 and had the flow and sezary performed. The pt notes that she had these done on the same day as her visit and has not been able to understand the results much yet. The pt notes that this was the best flow results she has had yet. The pt notes that her skin reactions have still been occurring to the treatment, but that her body is getting more used to the treatment with more cycles. The pt notes that she is continuing with three days of the Prednisone. The pt also notes that some new spots have started popping up in her face where they have not been prior. The pt notes that these skin spots occur immediately after the treatment and in concentrated areas.  The pt notes that she has been thinking of seeing a nutritionist to better optimize her diet. She has been trying to avoid processed foods and meats. The pt notes her neuropathy has been very steady and maybe slightly worse. She is continuing to monitor this.  Lab results today 03/10/2021 of CBC w/diff and CMP is as follows: all values are WNL except for Glucose of 100. 03/10/2021 LDH of 221.  02/22/2021 Sezary Cell Analysis showed 9% Sezary-like cells present.  02/22/2021 Flow Cytometry showed CD4+/CD7- cells are 21% of the total lymphoid gate. CD4+/CD26- cells are 22% of the total lymphoid gate. The CD4:CD8 ratio of the T-cells is 1.5:1. There is  no evidence suggestive of T-cell clonality.  On review of systems, pt reports continued neuropathy, skin spots, muscle weakness and denies sudden weight loss, decreased appetite, leg swelling, abdominal pain, and any other symptoms.   MEDICAL HISTORY:  Past Medical History:  Diagnosis Date  . Adenomatous polyp   . 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 Family history of anesthesia complication    sister and sister's children postitive for [place]!!!!!!, pt tested negative 3-68yrs ago  . GERD (gastroesophageal reflux disease)   . History of hypertension    off medication after weight loss  . Hyperlipemia   . 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[place])   . Osteopenia    bilateral hips  . Rectal cancer ([place]) 04/2017    SURGICAL HISTORY: Past Surgical History:  Procedure Laterality Date  . COLONOSCOPY  5/13   with biopsy  . DILATATION & CURRETTAGE/HYSTEROSCOPY WITH RESECTOCOPE N/A 06/02/2014   Procedure: DILATATION & CURETTAGE/HYSTEROSCOPY WITH RESECTOCOPE;  Surgeon: Lyman Speller, MD;  Location: [facility];  Service: Gynecology;  Laterality: N/A;  . FOOT SURGERY     Right   . TRANSANAL EXCISION OF RECTAL MASS N/A 06/09/2017   Procedure: TRANSANAL EXCISION OF RECTAL MASS;  Surgeon: [name], MD;  Location: [place];  Service: General;  Laterality: N/A;    SOCIAL HISTORY: Social History   Socioeconomic History  . Marital status: Single    Spouse name: Not on file  . Number of children: 0  . Years of education: Not on file  . Highest education level: Not on file  Occupational History  . Not on file  Tobacco Use  . Smoking status: Never Smoker  . Smokeless tobacco: Never Used  Vaping Use  . Vaping Use: Never used  Substance and Sexual Activity  . Alcohol use: Yes    Alcohol/week: 0.0 - 1.0 standard drinks  . Drug use: No  . Sexual activity: Not Currently    Partners: Male    Birth  control/protection: Post-menopausal  Other Topics Concern  . Not on file  Social History Narrative  . Not on file   Social Determinants of Health   Financial Resource Strain: Not on file  Food Insecurity: Not on file  Transportation Needs: Not on file  Physical Activity: Not on file  Stress: Not on file  Social Connections: Not on file  Intimate Partner Violence: Not on file    FAMILY HISTORY: Family History  Problem Relation Age of Onset  . Anuerysm Father   . Heart disease Father   . Diabetes Father   . Cancer Father        unknown type  . Hypertension Father   . Heart attack Mother   . Heart disease Mother   . Kidney disease Mother   . Kidney cancer Mother        contained tumor  . Hypertension Mother   . Heart attack Brother   . Cancer Brother        liver cancer  . Melanoma Brother   . Other Brother        Bypass surgery   . Hypertension Sister   . Ovarian cancer Paternal Grandmother        was PMP  . Colon cancer Neg Hx     ALLERGIES:  is allergic to diphenhydramine hcl and sudafed [pseudoephedrine hcl].  MEDICATIONS:  Current Outpatient Medications  Medication Sig Dispense Refill  . amLODipine (NORVASC) 5 MG tablet Take 1 tablet (5 mg total) by mouth daily. 90 tablet 3  . B Complex Vitamins (VITAMIN B COMPLEX) TABS Take by mouth. 5000 UT    . Biotin 5000 MCG CAPS     . cetirizine (ZYRTEC) 10 MG tablet Take 10 mg by mouth daily.    . Cholecalciferol (VITAMIN D) 125 MCG (5000 UT) CAPS     . Cod Liver Oil 1000 MG CAPS Take by mouth.    . ELDERBERRY PO Take by mouth daily.    . famotidine (PEPCID) 20 MG tablet Take 20 mg by mouth 2 (two) times daily.    . Nutritional Supplements (CHLORELLA-SPIRULINA COMPLEX) TABS Take 1 tablet by mouth daily.    . predniSONE (DELTASONE) 50 MG tablet Take 1 tab (50mg ) orally with breakfast for 5 days starting the morning after each Mogamulizumab treatment 30 tablet 0  . sodium chloride 0.9 % SOLN 250 mL with  mogamulizumab-kpkc 20 MG/5ML SOLN 1 mg/kg Inject 1 mg/kg into the vein.    Marland Kitchen  triamcinolone cream (KENALOG) 0.1 %     . metoprolol tartrate (LOPRESSOR) 25 MG tablet Take 0.5 tablets (12.5 mg total) by mouth daily.    . sertraline (ZOLOFT) 25 MG tablet Take by mouth.    Marland Kitchen UNABLE TO FIND Wheat grass     No current facility-administered medications for this visit.    REVIEW OF SYSTEMS:   10 Point review of Systems was done is negative except as noted above.  PHYSICAL EXAMINATION: ECOG PERFORMANCE STATUS: 1 - Symptomatic but completely ambulatory  . Vitals:   03/10/21 0938  BP: 130/62  Pulse: 77  Resp: 16  Temp: 97.7 F (36.5 [name])  SpO2: 100%   Filed Weights   03/10/21 0938  Weight: 132 lb 14.4 oz (60.3 kg)   .Body mass index is 24.31 kg/m.   GENERAL:alert, in no acute distress and comfortable SKIN: no significant lesions EYES: conjunctiva are pink and non-injected, sclera anicteric OROPHARYNX: MMM, no exudates, no oropharyngeal erythema or ulceration NECK: supple, no JVD LYMPH:  no palpable lymphadenopathy in the cervical, axillary or inguinal regions LUNGS: clear to auscultation b/l with normal respiratory effort HEART: regular rate & rhythm ABDOMEN:  normoactive bowel sounds , non tender, not distended. Extremity: no pedal edema PSYCH: alert & oriented x 3 with fluent speech NEURO: no focal motor/sensory deficits  LABORATORY DATA:  I have reviewed the data as listed  . CBC Latest Ref Rng & Units 03/10/2021 02/24/2021 02/10/2021  WBC 4.0 - 10.5 K/uL 4.2 4.9 7.0  Hemoglobin 12.0 - 15.0 g/dL 14.1 15.2(H) 14.7  Hematocrit 36.0 - 46.0 % 41.2 42.9 42.6  Platelets 150 - 400 K/uL 176 188 185    . CMP Latest Ref Rng & Units 03/10/2021 02/24/2021 02/10/2021  Glucose 70 - 99 mg/dL 100(H) 97 95  BUN 8 - 23 mg/dL 18 18 19   Creatinine 0.44 - 1.00 mg/dL 0.93 1.01(H) 1.00  Sodium 135 - 145 mmol/L 142 139 140  Potassium 3.5 - 5.1 mmol/L 4.6 4.3 4.1  Chloride 98 - 111 mmol/L 107 105  103  CO2 22 - 32 mmol/L 24 24 25   Calcium 8.9 - 10.3 mg/dL 9.1 9.1 9.1  Total Protein 6.5 - 8.1 g/dL 6.9 7.1 7.4  Total Bilirubin 0.3 - 1.2 mg/dL 0.8 0.9 0.9  Alkaline Phos 38 - 126 U/L 68 71 75  AST 15 - 41 U/L 23 21 22   ALT 0 - 44 U/L 14 16 20    02/22/2021 Sezary Cell Analysis  9% Sezary-like cells present.   02/22/2021 Flow Cytometry  CD4+/CD7- cells are 21% of the total lymphoid gate. CD4+/CD26- cells are 22% of the total lymphoid gate. The CD4:CD8 ratio of the T-cells is 1.5:1. There is no evidence suggestive of T-cell clonality.  RADIOGRAPHIC STUDIES: I have personally reviewed the radiological images as listed and agreed with the findings in the report. No results found.  ASSESSMENT & PLAN:   70 yo with   1) CTCL with previous treatment as noted above.  PLAN: -Discussed pt labwork today, 03/10/2021; blood counts and chemistries all completely normal, LDH stable. -Advised pt that these tests were more broad and are not as quantifiable regarding decrease in her cancer. These show if there was breakthrough or great increases that suggests progression and treatment not working. -Recommended pt receive the second COVID booster shot as recently approved. Advised pt to wait 4-6 months following first booster shot before getting this. -Discussed Evusheld and pt's eligibility. Will send referral. Advised pt to wait around one  month after this before getting the second booster shot. -Advised pt that she can use 1% Hydrocortisone OTC for her face spots. -Advised pt that as the burden of disease decreases, the skin reactions should hopefully improve. -Recommended sandalwood paste for skin / flares. -Recommended pt avoid hot water showers and use milder soaps / pat dry. -Recommended pt drink 48-64 oz water daily. -Continue Singulair once daily.  -Continue Pepcid and Zyrtec. Benadryl in addition if flare-up prn. -Will see back in one month with C6D1.    FOLLOW UP: Plz add MD  visit to treatment on 04/07/2021 Referral for Evusheld F/u as scheduled for next 2 treatments of Mogamuli   All of the patients questions were answered with apparent satisfaction. The patient knows to call the clinic with any problems, questions or concerns.   The total time spent in the appointment was 35 minutes and more than 50% was on counseling and direct patient cares.      Sullivan Lone MD MS AAHIVMS [facility]  [facility] Hematology/Oncology Physician [facility]  (Office):       [phone] (Work cell):  [phone] (Fax):           [phone]  I have reviewed the above documentation for accuracy and completeness, and I agree with the above.   03/10/2021 10:48 AM  I, Reinaldo Raddle, am acting as scribe for Dr. Sullivan Lone, MD.  .I have reviewed the above documentation for accuracy and completeness, and I agree with the above. Brunetta Genera MD

## 2021-03-10 ENCOUNTER — Inpatient Hospital Stay: Payer: Medicare Other | Attending: Hematology

## 2021-03-10 ENCOUNTER — Inpatient Hospital Stay: Payer: Medicare Other

## 2021-03-10 ENCOUNTER — Inpatient Hospital Stay (HOSPITAL_BASED_OUTPATIENT_CLINIC_OR_DEPARTMENT_OTHER): Payer: Medicare Other | Admitting: Hematology

## 2021-03-10 ENCOUNTER — Other Ambulatory Visit: Payer: Self-pay

## 2021-03-10 VITALS — BP 130/62 | HR 77 | Temp 97.7°F | Resp 16 | Ht 62.0 in | Wt 132.9 lb

## 2021-03-10 DIAGNOSIS — Z85048 Personal history of other malignant neoplasm of rectum, rectosigmoid junction, and anus: Secondary | ICD-10-CM | POA: Insufficient documentation

## 2021-03-10 DIAGNOSIS — Z806 Family history of leukemia: Secondary | ICD-10-CM | POA: Insufficient documentation

## 2021-03-10 DIAGNOSIS — M8580 Other specified disorders of bone density and structure, unspecified site: Secondary | ICD-10-CM | POA: Diagnosis not present

## 2021-03-10 DIAGNOSIS — Z5112 Encounter for antineoplastic immunotherapy: Secondary | ICD-10-CM | POA: Insufficient documentation

## 2021-03-10 DIAGNOSIS — E785 Hyperlipidemia, unspecified: Secondary | ICD-10-CM | POA: Insufficient documentation

## 2021-03-10 DIAGNOSIS — C8590 Non-Hodgkin lymphoma, unspecified, unspecified site: Secondary | ICD-10-CM

## 2021-03-10 DIAGNOSIS — C8408 Mycosis fungoides, lymph nodes of multiple sites: Secondary | ICD-10-CM

## 2021-03-10 DIAGNOSIS — Z86010 Personal history of colonic polyps: Secondary | ICD-10-CM | POA: Diagnosis not present

## 2021-03-10 DIAGNOSIS — Z7189 Other specified counseling: Secondary | ICD-10-CM

## 2021-03-10 DIAGNOSIS — C8409 Mycosis fungoides, extranodal and solid organ sites: Secondary | ICD-10-CM | POA: Insufficient documentation

## 2021-03-10 DIAGNOSIS — K219 Gastro-esophageal reflux disease without esophagitis: Secondary | ICD-10-CM | POA: Diagnosis not present

## 2021-03-10 DIAGNOSIS — Z8052 Family history of malignant neoplasm of bladder: Secondary | ICD-10-CM | POA: Insufficient documentation

## 2021-03-10 DIAGNOSIS — Z8041 Family history of malignant neoplasm of ovary: Secondary | ICD-10-CM | POA: Diagnosis not present

## 2021-03-10 DIAGNOSIS — Z5111 Encounter for antineoplastic chemotherapy: Secondary | ICD-10-CM

## 2021-03-10 LAB — CMP (CANCER CENTER ONLY)
ALT: 14 U/L (ref 0–44)
AST: 23 U/L (ref 15–41)
Albumin: 3.8 g/dL (ref 3.5–5.0)
Alkaline Phosphatase: 68 U/L (ref 38–126)
Anion gap: 11 (ref 5–15)
BUN: 18 mg/dL (ref 8–23)
CO2: 24 mmol/L (ref 22–32)
Calcium: 9.1 mg/dL (ref 8.9–10.3)
Chloride: 107 mmol/L (ref 98–111)
Creatinine: 0.93 mg/dL (ref 0.44–1.00)
GFR, Estimated: >60 mL/min (ref >60)
Glucose, Bld: 100 mg/dL — ABNORMAL HIGH (ref 70–99)
Potassium: 4.6 mmol/L (ref 3.5–5.1)
Sodium: 142 mmol/L (ref 135–145)
Total Bilirubin: 0.8 mg/dL (ref 0.3–1.2)
Total Protein: 6.9 g/dL (ref 6.5–8.1)

## 2021-03-10 LAB — CBC WITH DIFFERENTIAL (CANCER CENTER ONLY)
Abs Immature Granulocytes: 0.02 10*3/uL (ref 0.00–0.07)
Basophils Absolute: 0 10*3/uL (ref 0.0–0.1)
Basophils Relative: 1 %
Eosinophils Absolute: 0.2 10*3/uL (ref 0.0–0.5)
Eosinophils Relative: 5 %
HCT: 41.2 % (ref 36.0–46.0)
Hemoglobin: 14.1 g/dL (ref 12.0–15.0)
Immature Granulocytes: 1 %
Lymphocytes Relative: 18 %
Lymphs Abs: 0.8 10*3/uL (ref 0.7–4.0)
MCH: 33.2 pg (ref 26.0–34.0)
MCHC: 34.2 g/dL (ref 30.0–36.0)
MCV: 96.9 fL (ref 80.0–100.0)
Monocytes Absolute: 0.5 10*3/uL (ref 0.1–1.0)
Monocytes Relative: 11 %
Neutro Abs: 2.8 10*3/uL (ref 1.7–7.7)
Neutrophils Relative %: 64 %
Platelet Count: 176 10*3/uL (ref 150–400)
RBC: 4.25 MIL/uL (ref 3.87–5.11)
RDW: 12.2 % (ref 11.5–15.5)
WBC Count: 4.2 10*3/uL (ref 4.0–10.5)
nRBC: 0 % (ref 0.0–0.2)

## 2021-03-10 LAB — LACTATE DEHYDROGENASE: LDH: 221 U/L — ABNORMAL HIGH (ref 98–192)

## 2021-03-10 MED ORDER — ACETAMINOPHEN 325 MG PO TABS
ORAL_TABLET | ORAL | Status: AC
  Filled 2021-03-10: qty 2

## 2021-03-10 MED ORDER — MOGAMULIZUMAB-KPKC CHEMO INJECTIION 20 MG/5ML
1.0500 mg/kg | Freq: Once | INTRAVENOUS | Status: AC
  Administered 2021-03-10: 60 mg via INTRAVENOUS
  Filled 2021-03-10: qty 15

## 2021-03-10 MED ORDER — SODIUM CHLORIDE 0.9 % IV SOLN
Freq: Once | INTRAVENOUS | Status: AC
  Filled 2021-03-10: qty 250

## 2021-03-10 MED ORDER — METHYLPREDNISOLONE SODIUM SUCC 125 MG IJ SOLR
INTRAMUSCULAR | Status: AC
  Filled 2021-03-10: qty 2

## 2021-03-10 MED ORDER — METHYLPREDNISOLONE SODIUM SUCC 125 MG IJ SOLR
125.0000 mg | Freq: Once | INTRAMUSCULAR | Status: AC
  Administered 2021-03-10: 125 mg via INTRAVENOUS

## 2021-03-10 MED ORDER — DIPHENHYDRAMINE HCL 25 MG PO CAPS
25.0000 mg | ORAL_CAPSULE | Freq: Once | ORAL | Status: AC
  Administered 2021-03-10: 25 mg via ORAL

## 2021-03-10 MED ORDER — ACETAMINOPHEN 325 MG PO TABS
650.0000 mg | ORAL_TABLET | Freq: Once | ORAL | Status: AC
  Administered 2021-03-10: 650 mg via ORAL

## 2021-03-10 MED ORDER — DIPHENHYDRAMINE HCL 25 MG PO CAPS
ORAL_CAPSULE | ORAL | Status: AC
  Filled 2021-03-10: qty 1

## 2021-03-10 NOTE — Patient Instructions (Signed)
[facility] Discharge Instructions for Patients Receiving Chemotherapy  Today you received the following chemotherapy agents mogamulizumab-kpkc  To help prevent nausea and vomiting after your treatment, we encourage you to take your nausea medication as directed.   If you develop nausea and vomiting that is not controlled by your nausea medication, call the clinic.   BELOW ARE SYMPTOMS THAT SHOULD BE REPORTED IMMEDIATELY:  *FEVER GREATER THAN 100.5 F  *CHILLS WITH OR WITHOUT FEVER  NAUSEA AND VOMITING THAT IS NOT CONTROLLED WITH YOUR NAUSEA MEDICATION  *UNUSUAL SHORTNESS OF BREATH  *UNUSUAL BRUISING OR BLEEDING  TENDERNESS IN MOUTH AND THROAT WITH OR WITHOUT PRESENCE OF ULCERS  *URINARY PROBLEMS  *BOWEL PROBLEMS  UNUSUAL RASH Items with * indicate a potential emergency and should be followed up as soon as possible.  Feel free to call the clinic should you have any questions or concerns. The clinic phone number is [phone].  Please show the CHEMO ALERT CARD at check-in to the Emergency Department and triage nurse.   

## 2021-03-12 ENCOUNTER — Encounter: Payer: Self-pay | Admitting: Hematology

## 2021-03-13 ENCOUNTER — Ambulatory Visit (INDEPENDENT_AMBULATORY_CARE_PROVIDER_SITE_OTHER): Payer: Medicare Other

## 2021-03-13 ENCOUNTER — Encounter (HOSPITAL_COMMUNITY): Payer: Self-pay | Admitting: *Deleted

## 2021-03-13 ENCOUNTER — Other Ambulatory Visit: Payer: Self-pay

## 2021-03-13 ENCOUNTER — Ambulatory Visit (HOSPITAL_COMMUNITY)
Admission: EM | Admit: 2021-03-13 | Discharge: 2021-03-13 | Disposition: A | Discharge status: Home / Self Care | Payer: Medicare Other | Attending: Internal Medicine | Admitting: Internal Medicine

## 2021-03-13 DIAGNOSIS — R300 Dysuria: Secondary | ICD-10-CM | POA: Insufficient documentation

## 2021-03-13 DIAGNOSIS — R350 Frequency of micturition: Secondary | ICD-10-CM | POA: Diagnosis not present

## 2021-03-13 DIAGNOSIS — R1032 Left lower quadrant pain: Secondary | ICD-10-CM | POA: Diagnosis present

## 2021-03-13 DIAGNOSIS — R319 Hematuria, unspecified: Secondary | ICD-10-CM

## 2021-03-13 DIAGNOSIS — M545 Low back pain, unspecified: Secondary | ICD-10-CM

## 2021-03-13 LAB — COMPREHENSIVE METABOLIC PANEL
ALT: 29 U/L (ref 0–44)
AST: 27 U/L (ref 15–41)
Albumin: 4.1 g/dL (ref 3.5–5.0)
Alkaline Phosphatase: 63 U/L (ref 38–126)
Anion gap: 8 (ref 5–15)
BUN: 18 mg/dL (ref 8–23)
CO2: 26 mmol/L (ref 22–32)
Calcium: 9.4 mg/dL (ref 8.9–10.3)
Chloride: 101 mmol/L (ref 98–111)
Creatinine, Ser: 0.97 mg/dL (ref 0.44–1.00)
GFR, Estimated: >60 mL/min (ref >60)
Glucose, Bld: 132 mg/dL — ABNORMAL HIGH (ref 70–99)
Potassium: 4.2 mmol/L (ref 3.5–5.1)
Sodium: 135 mmol/L (ref 135–145)
Total Bilirubin: 0.9 mg/dL (ref 0.3–1.2)
Total Protein: 7.3 g/dL (ref 6.5–8.1)

## 2021-03-13 LAB — CBC WITH DIFFERENTIAL/PLATELET
Abs Immature Granulocytes: 0.04 10*3/uL (ref 0.00–0.07)
Basophils Absolute: 0 10*3/uL (ref 0.0–0.1)
Basophils Relative: 0 %
Eosinophils Absolute: 0 10*3/uL (ref 0.0–0.5)
Eosinophils Relative: 0 %
HCT: 41.4 % (ref 36.0–46.0)
Hemoglobin: 14.6 g/dL (ref 12.0–15.0)
Immature Granulocytes: 0 %
Lymphocytes Relative: 5 %
Lymphs Abs: 0.5 10*3/uL — ABNORMAL LOW (ref 0.7–4.0)
MCH: 33.2 pg (ref 26.0–34.0)
MCHC: 35.3 g/dL (ref 30.0–36.0)
MCV: 94.1 fL (ref 80.0–100.0)
Monocytes Absolute: 0.3 10*3/uL (ref 0.1–1.0)
Monocytes Relative: 3 %
Neutro Abs: 8.2 10*3/uL — ABNORMAL HIGH (ref 1.7–7.7)
Neutrophils Relative %: 92 %
Platelets: 232 10*3/uL (ref 150–400)
RBC: 4.4 MIL/uL (ref 3.87–5.11)
RDW: 11.8 % (ref 11.5–15.5)
WBC: 9 10*3/uL (ref 4.0–10.5)
nRBC: 0 % (ref 0.0–0.2)

## 2021-03-13 LAB — POCT URINALYSIS DIPSTICK, ED / UC
Bilirubin Urine: NEGATIVE
Glucose, UA: NEGATIVE mg/dL
Ketones, ur: NEGATIVE mg/dL
Leukocytes,Ua: NEGATIVE
Nitrite: NEGATIVE
Protein, ur: NEGATIVE mg/dL
Specific Gravity, Urine: <=1.005 (ref 1.005–1.030)
Urobilinogen, UA: 0.2 mg/dL (ref 0.0–1.0)
pH: 5.5 (ref 5.0–8.0)

## 2021-03-13 MED ORDER — CEPHALEXIN 500 MG PO CAPS: 500.0000 mg | ORAL_CAPSULE | Freq: Three times a day (TID) | ORAL | 0 refills | Status: DC

## 2021-03-13 NOTE — ED Provider Notes (Signed)
[facility]    CSN: [id number] Arrival date & time: 03/13/21  1535      History   Chief Complaint Chief Complaint  Patient presents with  . Abdominal Pain  . Back Pain  . Dysuria    HPI [place] is a 70 y.o. female.   Patient presents today with a several hour history of lower back pain with radiation to left lower abdomen.  She was in a Pilates class when symptoms began but has since developed urinary symptoms making her concern for UTI.  She reports frequency, bladder spasms, dysuria.  She denies any hematuria, flank pain, fever, nausea, vomiting.  She has a history of lymphoma and is currently receiving immunotherapy.  She denies history of recurrent UTI, single kidney, seeing urologist in the past.  She does report being told she has kidney stones based on imaging but has never passed a kidney stone or seen urology.  She reports pain is rated 5 on a 0-10 pain scale, localized to lumbar back with radiation anteriorly to abdomen, described as aching, no aggravating relieving factors identified.  She has not tried any over-the-counter medication for symptom management.  Denies any recent antibiotic use.  Denies recent urogenital procedure, catheter use.     Past Medical History:  Diagnosis Date  . Adenomatous polyp   . 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  . Family history of anesthesia complication    sister and sister's children postitive for [place]!!!!!!, pt tested negative 3-2yrs ago  . GERD (gastroesophageal reflux disease)   . History of hypertension    off medication after weight loss  . Hyperlipemia   . Lymphoma ([place])    light box therapy  . 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 Osteopenia    bilateral hips  . Rectal cancer ([place]) 04/2017    Patient Active Problem List   Diagnosis Date Noted  . Counseling regarding advance care planning and goals of care 12/04/2020  . 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 Situational  anxiety 08/13/2019  . Hypertension, essential 02/06/2018  . Other hyperlipidemia 02/06/2018  . Colorectal cancer, stage I ([place]) 09/19/2017  . Laryngopharyngeal reflux (LPR) 12/23/2016  . 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 Sciatic nerve pain 07/28/2016  . Achilles tendinitis of right lower extremity 06/10/2015  . Dysphagia 05/09/2012  . Palpitations 09/26/2011    Past Surgical History:  Procedure Laterality Date  . COLONOSCOPY  5/13   with biopsy  . DILATATION & CURRETTAGE/HYSTEROSCOPY WITH RESECTOCOPE N/A 06/02/2014   Procedure: [place];  Surgeon: Lyman Speller, MD;  Location: [place] [facility];  Service: Gynecology;  Laterality: N/A;  . FOOT SURGERY     Right   . TRANSANAL EXCISION OF RECTAL MASS N/A 06/09/2017   Procedure: TRANSANAL EXCISION OF RECTAL MASS;  Surgeon: [name], MD;  Location: [place];  Service: General;  Laterality: N/A;    OB History    Gravida  1   Para  0   Term      Preterm      AB      Living  0     SAB      IAB      Ectopic      Multiple      Live Births               Home Medications    Prior to Admission medications  Medication Sig Start Date End Date Taking? Authorizing Provider  amLODipine (NORVASC) 5 MG tablet Take 1 tablet (5 mg total) by mouth daily. 11/05/20 05/04/21  Cantwell, Celeste C, PA-C  B Complex Vitamins (VITAMIN B COMPLEX) TABS Take by mouth. [place]    [provider]  Biotin 5000 MCG CAPS  07/08/20   [provider]  cephALEXin (KEFLEX) 500 MG capsule Take 1 capsule (500 mg total) by mouth 3 (three) times daily. 03/13/21   Carena Stream, Derry Skill, PA-C  cetirizine (ZYRTEC) 10 MG tablet Take 10 mg by mouth daily.    [provider]  Cholecalciferol (VITAMIN D) 125 MCG (5000 UT) CAPS     [provider]  Cod Liver Oil 1000 MG CAPS Take by mouth.    [provider]  ELDERBERRY PO Take by mouth daily.    [provider]  famotidine (PEPCID) 20 MG tablet Take 20 mg by mouth 2 (two) times daily.    [provider]  metoprolol tartrate (LOPRESSOR) 25 MG tablet Take 0.5 tablets (12.5 mg total) by mouth daily. 11/26/20 02/24/21  Cantwell, Celeste C, PA-C  Nutritional Supplements (CHLORELLA-SPIRULINA COMPLEX) TABS Take 1 tablet by mouth daily. 11/07/18   [provider]  predniSONE (DELTASONE) 50 MG tablet Take 1 tab (50mg ) orally with breakfast for 5 days starting the morning after each Mogamulizumab treatment 01/13/21   Brunetta Genera, MD  sertraline (ZOLOFT) 25 MG tablet Take by mouth. 10/20/20   [provider]  sodium chloride 0.9 % SOLN 250 mL with mogamulizumab-kpkc 20 MG/5ML SOLN 1 mg/kg Inject 1 mg/kg into the vein.    [provider]  triamcinolone cream (KENALOG) 0.1 %  05/08/20   [provider]  UNABLE TO FIND Wheat grass    [provider]    Family History Family History  Problem Relation Age of Onset  . Anuerysm Father   . Heart disease Father   . Diabetes Father   . Cancer Father        unknown type  . Hypertension Father   . Heart attack Mother   . Heart disease Mother   . Kidney disease Mother   . Kidney cancer Mother        contained tumor  . Hypertension Mother   . Heart attack Brother   . Cancer Brother        liver cancer  . Melanoma Brother   . Other Brother        Bypass surgery   . Hypertension Sister   . Ovarian cancer Paternal Grandmother        was PMP  . Colon cancer Neg Hx     Social History Social History   Tobacco Use  . Smoking status: Never Smoker  . Smokeless tobacco: Never Used  Vaping Use  . Vaping Use: Never used  Substance Use Topics  . Alcohol use: Yes    Alcohol/week: 0.0 - 1.0 standard drinks  . Drug use: No     Allergies   Diphenhydramine hcl and Sudafed [pseudoephedrine hcl]   Review of Systems Review of Systems  Constitutional: Negative for activity change, appetite  change, fatigue and fever.  Respiratory: Negative for cough and shortness of breath.   Cardiovascular: Negative for chest pain.  Gastrointestinal: Positive for abdominal pain. Negative for diarrhea, nausea and vomiting.  Genitourinary: Positive for dysuria and frequency. Negative for flank pain, genital sores, urgency, vaginal bleeding, vaginal discharge and vaginal pain.  Musculoskeletal: Positive for  back pain.  Neurological: Negative for dizziness, light-headedness and headaches.     Physical Exam Triage Vital Signs ED Triage Vitals  Enc Vitals Group     BP 03/13/21 1607 (!) 172/78     Pulse Rate 03/13/21 1607 69     Resp 03/13/21 1607 18     Temp 03/13/21 1607 98.7 F (37.1 C)     Temp Source 03/13/21 1607 Oral     SpO2 03/13/21 1607 98 %     Weight --      Height --      Head Circumference --      Peak Flow --      Pain Score 03/13/21 1609 5     Pain Loc --      Pain Edu? --      Excl. in [place]? --    No data found.  Updated Vital Signs BP (!) 172/78 (BP Location: Right Arm)   Pulse 69   Temp 98.7 F (37.1 C) (Oral)   Resp 18   LMP 11/07/2002   SpO2 97%   Visual Acuity Right Eye Distance:   Left Eye Distance:   Bilateral Distance:    Right Eye Near:   Left Eye Near:    Bilateral Near:     Physical Exam Vitals reviewed.  Constitutional:      General: She is awake. She is not in acute distress.    Appearance: Normal appearance. She is not ill-appearing.     Comments: Very pleasant female appears stated age in no acute distress  HENT:     Head: Normocephalic and atraumatic.  Cardiovascular:     Rate and Rhythm: Normal rate and regular rhythm.     Heart sounds: No murmur heard.   Pulmonary:     Effort: Pulmonary effort is normal.     Breath sounds: Normal breath sounds. No wheezing, rhonchi or rales.     Comments: Clear to auscultation bilaterally Abdominal:     General: Bowel sounds are normal.     Palpations: Abdomen is soft.     Tenderness: There  is no abdominal tenderness. There is no right CVA tenderness, left CVA tenderness, guarding or rebound.     Comments: Benign abdominal exam; no tenderness palpation.  No CVA tenderness.  Psychiatric:        Behavior: Behavior is cooperative.      UC Treatments / Results  Labs (all labs ordered are listed, but only abnormal results are displayed) Labs Reviewed  POCT URINALYSIS DIPSTICK, ED / UC - Abnormal; Notable for the following components:      Result Value   Hgb urine dipstick MODERATE (*)    All other components within normal limits  URINE CULTURE  CBC WITH DIFFERENTIAL/PLATELET  COMPREHENSIVE METABOLIC PANEL    EKG   Radiology No results found.  Procedures Procedures (including critical care time)  Medications Ordered in UC Medications - No data to display  Initial Impression / Assessment and Plan / UC Course  I have reviewed the triage vital signs and the nursing notes.  Pertinent labs & imaging results that were available during my care of the patient were reviewed by me and considered in my medical decision making (see chart for details).     KUB was ordered to rule out kidney stone but unfortunately this was not read before patient had to leave.  Will empirically treat for UTI with Keflex.  Basic labs obtained today-results pending.  Discussed that if she has any  worsening symptoms or persistent pain she is to go to the ER for further evaluation to which she expressed understanding.  Recommended she use Tylenol for pain relief and drink plenty of fluid.  Strict return precautions given to which patient expressed understanding.  Final Clinical Impressions(s) / UC Diagnoses   Final diagnoses:  Acute left-sided low back pain without sciatica  LLQ abdominal pain  Urinary frequency  Dysuria     Discharge Instructions     We are going to treat you as if you have a urinary tract infection given clinical presentation.  Take Keflex 3 times a day.  We will be in  touch with your urine culture results as soon as we have them.  I will be in touch with your lab and imaging results if we need to take additional steps.  If you have any worsening symptoms please go to the emergency room as we discussed.    ED Prescriptions    Medication Sig Dispense Auth. Provider   cephALEXin (KEFLEX) 500 MG capsule  (Status: Discontinued) Take 1 capsule (500 mg total) by mouth 3 (three) times daily. 21 capsule Jandiel Magallanes K, PA-C   cephALEXin (KEFLEX) 500 MG capsule Take 1 capsule (500 mg total) by mouth 3 (three) times daily. 21 capsule Arad Burston K, PA-C     PDMP not reviewed this encounter.   Terrilee Croak, PA-C 03/13/21 1839

## 2021-03-13 NOTE — Discharge Instructions (Addendum)
We are going to treat you as if you have a urinary tract infection given clinical presentation.  Take Keflex 3 times a day.  We will be in touch with your urine culture results as soon as we have them.  I will be in touch with your lab and imaging results if we need to take additional steps.  If you have any worsening symptoms please go to the emergency room as we discussed.

## 2021-03-13 NOTE — ED Triage Notes (Signed)
Pt reports today onset of lower back pain ,dysuria and ABD pain. Pt reports receiving an infusion for lymphoma at [facility] yesterday.

## 2021-03-14 LAB — URINE CULTURE

## 2021-03-15 ENCOUNTER — Telehealth: Payer: Self-pay | Admitting: Adult Health

## 2021-03-15 NOTE — Telephone Encounter (Signed)
I spoke with Alexa [name] about [place] for Covid 19 prevention as she receives [employer].  I will send her information through my chart, and she will call me when she is ready to schedule, or if she has further questions.   Wilber Bihari, NP

## 2021-03-21 ENCOUNTER — Encounter: Payer: Self-pay | Admitting: Hematology

## 2021-03-23 ENCOUNTER — Other Ambulatory Visit: Payer: Self-pay | Admitting: *Deleted

## 2021-03-23 DIAGNOSIS — C8590 Non-Hodgkin lymphoma, unspecified, unspecified site: Secondary | ICD-10-CM

## 2021-03-23 NOTE — Progress Notes (Incomplete)
HEMATOLOGY/ONCOLOGY CLINIC NOTE  Date of Service: 03/23/2021  Patient Care Team: [name], Michael C, MD as PCP - General (Family Medicine)  CHIEF COMPLAINTS/PURPOSE OF CONSULTATION:  Mycosis fungoides  HISTORY OF PRESENTING ILLNESS:   Alexa [name] is a wonderful 70 y.o. female who has been referred to us by D.r Arlan OrganPeter Ennever, MD for evaluation and management of mycosis fungoides, unspecified body region. The pt reports that she is doing well overall.   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 01/2016, she noticed a red patch on her left posterior calf which prompted presentation to her local dermatologist, Dr. Danella [place]Gruber in [place].  B. 02/14/16 punch biopsy was done of a pink-brown plaque on the right anterior thigh that showed ALI and was suspicious for CTCL, but did not confirm the diagnosis of CTCL/MF. Clobetasol prescribed and was effective until 03/2017 and was started on MTX (10mg  weekly) by Dr. Burna FortsStrowd.  C. 05/25/20-09/14/20 6 cycles of brentuximab D. 11/09/20 C1D1 Mogamulizumab".   Also from [facility], "2. Rectal carcinoma  A. 05/19/17 CT C/A/P was unremarkable and showed no evidence for metastatic disease B. 06/09/17 Patient had recent rectal polyp ectomy which revealed cancer within the specimen. Subsequent transanal excision  of the polypectomy site was performed 06/09/2017. Negative margins at the time of mucosal resection."  The pt reports that she is [facility]. She believes the spike in T-cells is due to the vaccination. She has the CD30 receptor that Brentuximab targets. She started with the Brentuximab in July for 6 treatments and then Mogamulizumab. This all started with a skin rash on her lower body. She notes her last CT scan was around November of last year, and denies enlarged lymph nodes. She states her mammogram showed some mammary lymph nodes  that were biopsied and showed T-cell Lymphoma.  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 This, in addition to neuropathy, is what influenced the switch to Mogamulizumab. This has not helped much, according to her, but has also had no side effects.   The pt notes that her skin involvement is as bad as it has been in a long time, and covers her entire body except the face and head regions. She goes for her fourth treatment tomorrow. She wishes to get infusions [facility] following the sixth cycle, but [facility]  The pt notes that the itching goes away during night, but is bothersome during day since treatments has started. She claims this is very distracting.   The pt noted that she did not use the Benadryl with first treatments of Bentruximab, which caused her to lose her hair and experience harsh side effects.  The pt notes that she takes Zyrtec and Pepcid daily. She is active and works out many times a week. She goes to church, does pilates, cooks, daily walks, and notes no fatigue or weakness.  She desires for a small dose of steroids in her pre-treatment medications.   The pt notes that she started having heart palpitations in November 2021. She was given Metopolol to help with this. There was nothing on her two-week monitor that was of concern, she notes. She notes there are no major palpitations currently. This all started with C6 Brentruximab.  The pt notes  that she had a polyp removed in 2018 (Transanal Excision of Rectal Mass). She continues to follow up as needed with Dr. Marcello Moores for this.   She claims Oxycodone makes her extremely nauseous and never wishes to take this again.  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 She note this was a very stressful work environment.  On review  of systems, pt reports skin rashes on all her body and irregular nightsweats except head/face, slight neuropathy, itching, tingling and denies n/v/d, chills, unexpected weight loss, fatigue, weakness, belly pain, abdominal pain, leg swelling and any other symptoms.  INTERVAL HISTORY  Alexa [name] is a wonderful 70 y.o. 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 The patient's last visit with [place] was on 03/10/2021 . The pt reports that she is doing well overall. She is here for C5D15 Mogamulizumab.   The pt reports ***  Lab results today 03/24/2021 of CBC w/diff and CMP is as follows: all values are WNL except for ***  On review of systems, pt reports *** and denies *** and any other symptoms.  MEDICAL HISTORY:  Past Medical History:  Diagnosis Date  . Adenomatous polyp   . 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 Family history of anesthesia complication    sister and sister's children postitive for [place]!!!!!!, pt tested negative 3-73yrs ago  . GERD (gastroesophageal reflux disease)   . History of hypertension    off medication after weight loss  . Hyperlipemia   . Lymphoma ([place])    light box therapy  . 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 Osteopenia    bilateral hips  . Rectal cancer ([place]) 04/2017    SURGICAL HISTORY: Past Surgical History:  Procedure Laterality Date  . COLONOSCOPY  5/13   with biopsy  . DILATATION & CURRETTAGE/HYSTEROSCOPY WITH RESECTOCOPE N/A 06/02/2014   Procedure: [place];  Surgeon: Lyman Speller, MD;  Location: [place] [facility];  Service: Gynecology;  Laterality: N/A;  . FOOT SURGERY     Right   . TRANSANAL EXCISION OF RECTAL MASS N/A 06/09/2017   Procedure: TRANSANAL EXCISION OF RECTAL MASS;  Surgeon: [name], MD;  Location: [place];  Service: General;  Laterality: N/A;    SOCIAL HISTORY: Social  History   Socioeconomic History  . Marital status: Single    Spouse name: Not on file  . Number of children: 0  . Years of education: Not on file  . Highest education level: Not on file  Occupational History  . Not on file  Tobacco Use  . Smoking status: Never Smoker  . Smokeless tobacco: Never Used  Vaping Use  . Vaping Use: Never used  Substance and Sexual Activity  . Alcohol use: Yes    Alcohol/week: 0.0 - 1.0 standard drinks  . Drug use: No  . Sexual activity: Not Currently    Partners: Male    Birth control/protection: Post-menopausal  Other Topics Concern  . Not on file  Social History Narrative  . Not on file   Social Determinants of Health   Financial Resource Strain: Not on file  Food Insecurity: Not on file  Transportation Needs: Not on file  Physical Activity: Not on file  Stress: Not on file  Social Connections: Not on file  Intimate Partner Violence: Not on file  FAMILY HISTORY: Family History  Problem Relation Age of Onset  . Anuerysm Father   . Heart disease Father   . Diabetes Father   . Cancer Father        unknown type  . Hypertension Father   . Heart attack Mother   . Heart disease Mother   . Kidney disease Mother   . Kidney cancer Mother        contained tumor  . Hypertension Mother   . Heart attack Brother   . Cancer Brother        liver cancer  . Melanoma Brother   . Other Brother        Bypass surgery   . Hypertension Sister   . Ovarian cancer Paternal Grandmother        was PMP  . Colon cancer Neg Hx     ALLERGIES:  is allergic to diphenhydramine hcl and sudafed [pseudoephedrine hcl].  MEDICATIONS:  Current Outpatient Medications  Medication Sig Dispense Refill  . amLODipine (NORVASC) 5 MG tablet Take 1 tablet (5 mg total) by mouth daily. 90 tablet 3  . B Complex Vitamins (VITAMIN B COMPLEX) TABS Take by mouth. 5000 UT    . Biotin 5000 MCG CAPS     . cephALEXin (KEFLEX) 500 MG capsule Take 1 capsule (500 mg total) by  mouth 3 (three) times daily. 21 capsule 0  . cetirizine (ZYRTEC) 10 MG tablet Take 10 mg by mouth daily.    . Cholecalciferol (VITAMIN D) 125 MCG (5000 UT) CAPS     . Cod Liver Oil 1000 MG CAPS Take by mouth.    . ELDERBERRY PO Take by mouth daily.    . famotidine (PEPCID) 20 MG tablet Take 20 mg by mouth 2 (two) times daily.    . metoprolol tartrate (LOPRESSOR) 25 MG tablet Take 0.5 tablets (12.5 mg total) by mouth daily.    . Nutritional Supplements (CHLORELLA-SPIRULINA COMPLEX) TABS Take 1 tablet by mouth daily.    . predniSONE (DELTASONE) 50 MG tablet Take 1 tab (50mg ) orally with breakfast for 5 days starting the morning after each Mogamulizumab treatment 30 tablet 0  . sertraline (ZOLOFT) 25 MG tablet Take by mouth.    . sodium chloride 0.9 % SOLN 250 mL with mogamulizumab-kpkc 20 MG/5ML SOLN 1 mg/kg Inject 1 mg/kg into the vein.    Marland Kitchen triamcinolone cream (KENALOG) 0.1 %     . UNABLE TO FIND Wheat grass     No current facility-administered medications for this visit.    REVIEW OF SYSTEMS:   10 Point review of Systems was done is negative except as noted above.  PHYSICAL EXAMINATION: ECOG PERFORMANCE STATUS: 1 - Symptomatic but completely ambulatory  . There were no vitals filed for this visit. There were no vitals filed for this visit. .There is no height or weight on file to calculate BMI.  ***  GENERAL:alert, in no acute distress and comfortable SKIN: no acute rashes, no significant lesions EYES: conjunctiva are pink and non-injected, sclera anicteric OROPHARYNX: MMM, no exudates, no oropharyngeal erythema or ulceration NECK: supple, no JVD LYMPH:  no palpable lymphadenopathy in the cervical, axillary or inguinal regions LUNGS: clear to auscultation b/l with normal respiratory effort HEART: regular rate & rhythm ABDOMEN:  normoactive bowel sounds , non tender, not distended. Extremity: no pedal edema PSYCH: alert & oriented x 3 with fluent speech NEURO: no focal  motor/sensory deficits  LABORATORY DATA:  I have reviewed the data as listed  .  CBC Latest Ref Rng & Units 03/13/2021 03/10/2021 02/24/2021  WBC 4.0 - 10.5 K/uL 9.0 4.2 4.9  Hemoglobin 12.0 - 15.0 g/dL 14.6 14.1 15.2(H)  Hematocrit 36.0 - 46.0 % 41.4 41.2 42.9  Platelets 150 - 400 K/uL 232 176 188    . CMP Latest Ref Rng & Units 03/13/2021 03/10/2021 02/24/2021  Glucose 70 - 99 mg/dL 132(H) 100(H) 97  BUN 8 - 23 mg/dL 18 18 18   Creatinine 0.44 - 1.00 mg/dL 0.97 0.93 1.01(H)  Sodium 135 - 145 mmol/L 135 142 139  Potassium 3.5 - 5.1 mmol/L 4.2 4.6 4.3  Chloride 98 - 111 mmol/L 101 107 105  CO2 22 - 32 mmol/L 26 24 24   Calcium 8.9 - 10.3 mg/dL 9.4 9.1 9.1  Total Protein 6.5 - 8.1 g/dL 7.3 6.9 7.1  Total Bilirubin 0.3 - 1.2 mg/dL 0.9 0.8 0.9  Alkaline Phos 38 - 126 U/L 63 68 71  AST 15 - 41 U/L 27 23 21   ALT 0 - 44 U/L 29 14 16    02/22/2021 Sezary Cell Analysis  9% Sezary-like cells present.   02/22/2021 Flow Cytometry  CD4+/CD7- cells are 21% of the total lymphoid gate. CD4+/CD26- cells are 22% of the total lymphoid gate. The CD4:CD8 ratio of the T-cells is 1.5:1. There is no evidence suggestive of T-cell clonality.  RADIOGRAPHIC STUDIES: I have personally reviewed the radiological images as listed and agreed with the findings in the report. DG Abdomen 1 View  Result Date: 03/13/2021 CLINICAL DATA:  Hematuria, low back pain EXAM: ABDOMEN - 1 VIEW COMPARISON:  None. FINDINGS: The bowel gas pattern is normal. No radio-opaque calculi or other significant radiographic abnormality are seen. IMPRESSION: Negative. Electronically Signed   By: Ulyses Jarred M.D.   On: 03/13/2021 19:13    ASSESSMENT & PLAN:   70 yo with   1) CTCL with previous treatment as noted above.  PLAN: -Discussed pt labwork today, 03/24/2021; ***   -Recommended pt drink 48-64 oz water daily. -Continue Singulair once daily.  -Continue Pepcid and Zyrtec. Benadryl in addition if flare-up prn. -Will see back in  ***    FOLLOW UP: ***   All of the patients questions were answered with apparent satisfaction. The patient knows to call the clinic with any problems, questions or concerns.   The total time spent in the appointment was *** minutes and more than 50% was on counseling and direct patient cares.      Sullivan Lone MD MS AAHIVMS [facility] Hematology/Oncology Physician [facility]  (Office):       [phone] (Work cell):  [phone] (Fax):           [phone]  I have reviewed the above documentation for accuracy and completeness, and I agree with the above.   03/23/2021 8:16 PM  I, Reinaldo Raddle, am acting as scribe for Dr. Sullivan Lone, MD.

## 2021-03-24 ENCOUNTER — Inpatient Hospital Stay: Payer: Medicare Other

## 2021-03-24 ENCOUNTER — Other Ambulatory Visit: Payer: Self-pay

## 2021-03-24 ENCOUNTER — Inpatient Hospital Stay: Payer: Medicare Other | Admitting: Hematology

## 2021-03-24 DIAGNOSIS — Z5112 Encounter for antineoplastic immunotherapy: Secondary | ICD-10-CM | POA: Diagnosis not present

## 2021-03-24 DIAGNOSIS — C8590 Non-Hodgkin lymphoma, unspecified, unspecified site: Secondary | ICD-10-CM

## 2021-03-24 DIAGNOSIS — Z7189 Other specified counseling: Secondary | ICD-10-CM

## 2021-03-24 DIAGNOSIS — C8408 Mycosis fungoides, lymph nodes of multiple sites: Secondary | ICD-10-CM

## 2021-03-24 LAB — CMP (CANCER CENTER ONLY)
ALT: 14 U/L (ref 0–44)
AST: 19 U/L (ref 15–41)
Albumin: 3.7 g/dL (ref 3.5–5.0)
Alkaline Phosphatase: 69 U/L (ref 38–126)
Anion gap: 11 (ref 5–15)
BUN: 17 mg/dL (ref 8–23)
CO2: 23 mmol/L (ref 22–32)
Calcium: 9.1 mg/dL (ref 8.9–10.3)
Chloride: 108 mmol/L (ref 98–111)
Creatinine: 0.95 mg/dL (ref 0.44–1.00)
GFR, Estimated: >60 mL/min (ref >60)
Glucose, Bld: 97 mg/dL (ref 70–99)
Potassium: 4.2 mmol/L (ref 3.5–5.1)
Sodium: 142 mmol/L (ref 135–145)
Total Bilirubin: 0.6 mg/dL (ref 0.3–1.2)
Total Protein: 6.8 g/dL (ref 6.5–8.1)

## 2021-03-24 LAB — LACTATE DEHYDROGENASE: LDH: 207 U/L — ABNORMAL HIGH (ref 98–192)

## 2021-03-24 LAB — CBC WITH DIFFERENTIAL (CANCER CENTER ONLY)
Abs Immature Granulocytes: 0.04 10*3/uL (ref 0.00–0.07)
Basophils Absolute: 0.1 10*3/uL (ref 0.0–0.1)
Basophils Relative: 1 %
Eosinophils Absolute: 0.4 10*3/uL (ref 0.0–0.5)
Eosinophils Relative: 6 %
HCT: 40.1 % (ref 36.0–46.0)
Hemoglobin: 14 g/dL (ref 12.0–15.0)
Immature Granulocytes: 1 %
Lymphocytes Relative: 15 %
Lymphs Abs: 0.9 10*3/uL (ref 0.7–4.0)
MCH: 33.3 pg (ref 26.0–34.0)
MCHC: 34.9 g/dL (ref 30.0–36.0)
MCV: 95.2 fL (ref 80.0–100.0)
Monocytes Absolute: 0.6 10*3/uL (ref 0.1–1.0)
Monocytes Relative: 10 %
Neutro Abs: 4.2 10*3/uL (ref 1.7–7.7)
Neutrophils Relative %: 67 %
Platelet Count: 192 10*3/uL (ref 150–400)
RBC: 4.21 MIL/uL (ref 3.87–5.11)
RDW: 12.1 % (ref 11.5–15.5)
WBC Count: 6.2 10*3/uL (ref 4.0–10.5)
nRBC: 0 % (ref 0.0–0.2)

## 2021-03-24 MED ORDER — ACETAMINOPHEN 325 MG PO TABS
650.0000 mg | ORAL_TABLET | Freq: Once | ORAL | Status: AC
  Administered 2021-03-24: 650 mg via ORAL

## 2021-03-24 MED ORDER — SODIUM CHLORIDE 0.9 % IV SOLN
1.0000 mg/kg | Freq: Once | INTRAVENOUS | Status: AC
  Administered 2021-03-24: 60 mg via INTRAVENOUS
  Filled 2021-03-24: qty 15

## 2021-03-24 MED ORDER — METHYLPREDNISOLONE SODIUM SUCC 125 MG IJ SOLR
125.0000 mg | Freq: Once | INTRAMUSCULAR | Status: AC
  Administered 2021-03-24: 125 mg via INTRAVENOUS

## 2021-03-24 MED ORDER — DIPHENHYDRAMINE HCL 25 MG PO CAPS
25.0000 mg | ORAL_CAPSULE | Freq: Once | ORAL | Status: AC
  Administered 2021-03-24: 25 mg via ORAL

## 2021-03-24 MED ORDER — METHYLPREDNISOLONE SODIUM SUCC 125 MG IJ SOLR
INTRAMUSCULAR | Status: AC
  Filled 2021-03-24: qty 2

## 2021-03-24 MED ORDER — ACETAMINOPHEN 325 MG PO TABS
ORAL_TABLET | ORAL | Status: AC
  Filled 2021-03-24: qty 2

## 2021-03-24 MED ORDER — ACETAMINOPHEN 325 MG PO TABS
ORAL_TABLET | ORAL | Status: AC
  Filled 2021-03-24: qty 1

## 2021-03-24 MED ORDER — DIPHENHYDRAMINE HCL 25 MG PO CAPS
ORAL_CAPSULE | ORAL | Status: AC
  Filled 2021-03-24: qty 1

## 2021-03-24 NOTE — Progress Notes (Signed)
This encounter was created in error - please disregard.

## 2021-03-24 NOTE — Patient Instructions (Signed)
[facility]  Discharge Instructions: Thank you for choosing [facility] to provide your oncology and hematology care.   If you have a lab appointment with the [facility], please go directly to the [facility] and check in at the registration area.   Wear comfortable clothing and clothing appropriate for easy access to any Portacath or PICC line.   We strive to give you quality time with your provider. You may need to reschedule your appointment if you arrive late (15 or more minutes).  Arriving late affects you and other patients whose appointments are after yours.  Also, if you miss three or more appointments without notifying the office, you may be dismissed from the clinic at the provider's discretion.      For prescription refill requests, have your pharmacy contact our office and allow 72 hours for refills to be completed.    Today you received the following chemotherapy and/or immunotherapy agents magamulizumab   To help prevent nausea and vomiting after your treatment, we encourage you to take your nausea medication as directed.  BELOW ARE SYMPTOMS THAT SHOULD BE REPORTED IMMEDIATELY: . *FEVER GREATER THAN 100.4 F (38 C) OR HIGHER . *CHILLS OR SWEATING . *NAUSEA AND VOMITING THAT IS NOT CONTROLLED WITH YOUR NAUSEA MEDICATION . *UNUSUAL SHORTNESS OF BREATH . *UNUSUAL BRUISING OR BLEEDING . *URINARY PROBLEMS (pain or burning when urinating, or frequent urination) . *BOWEL PROBLEMS (unusual diarrhea, constipation, pain near the anus) . TENDERNESS IN MOUTH AND THROAT WITH OR WITHOUT PRESENCE OF ULCERS (sore throat, sores in mouth, or a toothache) . UNUSUAL RASH, SWELLING OR PAIN  . UNUSUAL VAGINAL DISCHARGE OR ITCHING   Items with * indicate a potential emergency and should be followed up as soon as possible or go to the Emergency Department if any problems should occur.  Please show the CHEMOTHERAPY ALERT CARD or IMMUNOTHERAPY ALERT  CARD at check-in to the Emergency Department and triage nurse.  Should you have questions after your visit or need to cancel or reschedule your appointment, please contact [facility]  Dept: [phone]  and follow the prompts.  Office hours are 8:00 a.m. to 4:30 p.m. Monday - Friday. Please note that voicemails left after 4:00 p.m. may not be returned until the following business day.  We are closed weekends and major holidays. You have access to a nurse at all times for urgent questions. Please call the main number to the clinic Dept: [phone] and follow the prompts.   For any non-urgent questions, you may also contact your provider using MyChart. We now offer e-Visits for anyone 31 and older to request care online for non-urgent symptoms. For details visit [email or web address].   Also download the MyChart app! Go to the app store, search "MyChart", open the app, select [facility], and log in with your MyChart username and password.  Due to Covid, a mask is required upon entering the hospital/clinic. If you do not have a mask, one will be given to you upon arrival. For doctor visits, patients may have 1 support person aged 48 or older with them. For treatment visits, patients cannot have anyone with them due to current Covid guidelines and our immunocompromised population.

## 2021-04-02 ENCOUNTER — Inpatient Hospital Stay: Payer: Medicare Other | Admitting: Licensed Clinical Social Worker

## 2021-04-04 ENCOUNTER — Encounter: Payer: Self-pay | Admitting: Hematology

## 2021-04-06 ENCOUNTER — Other Ambulatory Visit: Payer: Self-pay

## 2021-04-06 DIAGNOSIS — C8590 Non-Hodgkin lymphoma, unspecified, unspecified site: Secondary | ICD-10-CM

## 2021-04-06 NOTE — Progress Notes (Signed)
HEMATOLOGY/ONCOLOGY CLINIC NOTE  Date of Service: 04/07/2021  Patient Care Team: Chesley Noon, MD as PCP - General (Family Medicine)  CHIEF COMPLAINTS/PURPOSE OF CONSULTATION:  Mycosis fungoides/Sezary syndrome  HISTORY OF PRESENTING ILLNESS:   Alexa [name] is a wonderful 70 y.o. 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 The pt reports that she is doing well overall.   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 01/2016, she noticed a red patch on her left posterior calf which prompted presentation to her local dermatologist, Dr. Tonia [name] in [place].  B. 02/14/16 punch biopsy was done of a pink-brown plaque on the right anterior thigh that showed ALI and was suspicious for CTCL, but did not confirm the diagnosis of CTCL/MF. Clobetasol prescribed and was effective until 03/2017 and was started on MTX (10mg  weekly) by Dr. Shaaron [name].  C. 05/25/20-09/14/20 6 cycles of brentuximab D. 11/09/20 C1D1 Mogamulizumab".   Also from [facility], "2. Rectal carcinoma  A. 05/19/17 CT C/A/P was unremarkable and showed no evidence for metastatic disease B. 06/09/17 Patient had recent rectal polyp ectomy which revealed cancer within the specimen. Subsequent transanal excision  of the polypectomy site was performed 06/09/2017. Negative margins at the time of mucosal resection."  The pt reports that she is seeing Dr. Lanier [name] at [facility]. She believes the spike in T-cells is due to the vaccination. She has the CD30 receptor that Brentuximab targets. She started with the Brentuximab in July for 6 treatments and then Mogamulizumab. This all started with a skin rash on her lower body. She notes her last CT scan was around November of last year, and denies enlarged lymph nodes. 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 This, in addition to neuropathy, is what influenced the switch to Mogamulizumab. This has not helped much, according to her, but has also had no side effects.   The pt notes that her skin involvement is as bad as it has been in a long time, and covers her entire body except the face and head regions. She goes for her fourth treatment tomorrow. She wishes to get infusions done at [facility] following the sixth cycle, but keep her doctors at [facility]  The pt notes that the itching goes away during night, but is bothersome during day since treatments has started. She claims this is very distracting.   The pt noted that she did not use the Benadryl with first treatments of Bentruximab, which caused her to lose her hair and experience harsh side effects.  The pt notes that she takes Zyrtec and Pepcid daily. She is active and works out many times a week. She goes to church, does pilates, cooks, daily walks, and notes no fatigue or weakness.  She desires for a small dose of steroids in her pre-treatment medications.   The pt notes that she started having heart palpitations in November 2021. She was given Metopolol to help with this. There was nothing on her two-week monitor that was of concern, she notes. She notes there are no major palpitations currently. This all started with C6 Brentruximab.  The pt  notes that she had a polyp removed in 2018 (Transanal Excision of Rectal Mass). She continues to follow up as needed with Dr. Marcello [name] for this.   She claims Oxycodone makes her extremely nauseous and never wishes to take this again.  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 She note this was a very stressful work  environment.  On review of systems, pt reports skin rashes on all her body and irregular nightsweats except head/face, slight neuropathy, itching, tingling and denies n/v/d, chills, unexpected weight loss, fatigue, weakness, belly pain, abdominal pain, leg swelling and any other symptoms.  INTERVAL HISTORY  [name] is a wonderful 70 y.o. female who is here today for follow up regarding evaluation and management of mycosis fungoides/sezary syndome. The patient's last visit with [place] was on 03/10/2021 . The pt reports that she is doing well overall. She is here for C6D1 Mogamulizumab.   The pt reports that she had a bladder infection on Saturday and went to urgent care. She was having pain in her back and pressure. The specimen came back inconclusive. She was on a seven day course of Cephalexin. After this, they did a repeat specimen and found E.Coli and Enterococcus. The pt notes they only did a urine culture. She did five days of Cipro. The pt currently has no active symptoms. She notes some issues with a tendon tear while stretching and on the Cipro. She felt a vulnerable feeling and was cautious, but this tendon still felt injured.  Lab results today 04/07/2021 of CBC w/diff and CMP is as follows: all values are WNL except for Eosinophils Abs of 1.2K, Creatinine of 1.02, GFR est of 60. 04/07/2021 LDH of 223.  On review of systems, pt reports recent infection issue, tendonapathy and denies fevers, chills, night sweats, decreased appetite, decreased fluid intake, and any other symptoms.   MEDICAL HISTORY:  Past Medical History:  Diagnosis Date  . Adenomatous polyp   . Cancer ([name])    cutaneous T-cell lymphoma; followed by Dr. Clovis Riley @ [facility]  . Family history of anesthesia complication    sister and sister's children postitive for [place]!!!!!!, pt tested negative 3-63yrs ago  . GERD (gastroesophageal reflux disease)   . History of hypertension    off medication after weight loss  .  Hyperlipemia   . Lymphoma ([place])    light box therapy  . 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[place])   . Osteopenia    bilateral hips  . Rectal cancer ([place]) 04/2017    SURGICAL HISTORY: Past Surgical History:  Procedure Laterality Date  . COLONOSCOPY  5/13   with biopsy  . DILATATION & CURRETTAGE/HYSTEROSCOPY WITH RESECTOCOPE N/A 06/02/2014   Procedure: [place];  Surgeon: Lyman Speller, MD;  Location: [facility];  Service: Gynecology;  Laterality: N/A;  . FOOT SURGERY     Right   . TRANSANAL EXCISION OF RECTAL MASS N/A 06/09/2017   Procedure: TRANSANAL EXCISION OF RECTAL MASS;  Surgeon: [name], MD;  Location: [facility];  Service: General;  Laterality: N/A;    SOCIAL HISTORY: Social History   Socioeconomic History  . Marital status: Single    Spouse name: Not on file  . Number of children: 0  . Years of education: Not on file  . Highest education level: Not on file  Occupational History  . Not on file  Tobacco Use  . Smoking status: Never  Smoker  . Smokeless tobacco: Never Used  Vaping Use  . Vaping Use: Never used  Substance and Sexual Activity  . Alcohol use: Yes    Alcohol/week: 0.0 - 1.0 standard drinks  . Drug use: No  . Sexual activity: Not Currently    Partners: Male    Birth control/protection: Post-menopausal  Other Topics Concern  . Not on file  Social History Narrative  . Not on file   Social Determinants of Health   Financial Resource Strain: Not on file  Food Insecurity: Not on file  Transportation Needs: Not on file  Physical Activity: Not on file  Stress: Not on file  Social Connections: Not on file  Intimate Partner Violence: Not on file    FAMILY HISTORY: Family History  Problem Relation Age of Onset  . Anuerysm Father   . Heart disease Father   . Diabetes Father   . Cancer Father        unknown type  . Hypertension Father   . Heart attack Mother   . Heart disease Mother   . Kidney  disease Mother   . Kidney cancer Mother        contained tumor  . Hypertension Mother   . Heart attack Brother   . Cancer Brother        liver cancer  . Melanoma Brother   . Other Brother        Bypass surgery   . Hypertension Sister   . Ovarian cancer Paternal Grandmother        was PMP  . Colon cancer Neg Hx     ALLERGIES:  is allergic to diphenhydramine hcl and sudafed [pseudoephedrine hcl].  MEDICATIONS:  Current Outpatient Medications  Medication Sig Dispense Refill  . amLODipine (NORVASC) 5 MG tablet Take 1 tablet (5 mg total) by mouth daily. 90 tablet 3  . B Complex Vitamins (VITAMIN B COMPLEX) TABS Take by mouth. 5000 UT    . Biotin 5000 MCG CAPS     . cephALEXin (KEFLEX) 500 MG capsule Take 1 capsule (500 mg total) by mouth 3 (three) times daily. 21 capsule 0  . cetirizine (ZYRTEC) 10 MG tablet Take 10 mg by mouth daily.    . Cholecalciferol (VITAMIN D) 125 MCG (5000 UT) CAPS     . Cod Liver Oil 1000 MG CAPS Take by mouth.    . ELDERBERRY PO Take by mouth daily.    . famotidine (PEPCID) 20 MG tablet Take 20 mg by mouth 2 (two) times daily.    . metoprolol tartrate (LOPRESSOR) 25 MG tablet Take 0.5 tablets (12.5 mg total) by mouth daily.    . Nutritional Supplements (CHLORELLA-SPIRULINA COMPLEX) TABS Take 1 tablet by mouth daily.    . predniSONE (DELTASONE) 50 MG tablet Take 1 tab (50mg ) orally with breakfast for 5 days starting the morning after each Mogamulizumab treatment 30 tablet 0  . sertraline (ZOLOFT) 25 MG tablet Take by mouth.    . sodium chloride 0.9 % SOLN 250 mL with mogamulizumab-kpkc 20 MG/5ML SOLN 1 mg/kg Inject 1 mg/kg into the vein.    Marland Kitchen triamcinolone cream (KENALOG) 0.1 %     . UNABLE TO FIND Wheat grass     No current facility-administered medications for this visit.    REVIEW OF SYSTEMS:   10 Point review of Systems was done is negative except as noted above.  PHYSICAL EXAMINATION: ECOG PERFORMANCE STATUS: 1 - Symptomatic but completely  ambulatory  . VS stable, reivewed Exam  was given in bed in infusion.   GENERAL:alert, in no acute distress and comfortable SKIN: no significant lesions EYES: conjunctiva are pink and non-injected, sclera anicteric OROPHARYNX: MMM, no exudates, no oropharyngeal erythema or ulceration NECK: supple, no JVD LYMPH:  no palpable lymphadenopathy in the cervical, axillary or inguinal regions LUNGS: clear to auscultation b/l with normal respiratory effort HEART: regular rate & rhythm ABDOMEN:  normoactive bowel sounds , non tender, not distended. Extremity: no pedal edema PSYCH: alert & oriented x 3 with fluent speech NEURO: no focal motor/sensory deficits  LABORATORY DATA:  I have reviewed the data as listed  . CBC Latest Ref Rng & Units 04/07/2021 03/24/2021 03/13/2021  WBC 4.0 - 10.5 K/uL 6.9 6.2 9.0  Hemoglobin 12.0 - 15.0 g/dL 14.8 14.0 14.6  Hematocrit 36.0 - 46.0 % 42.2 40.1 41.4  Platelets 150 - 400 K/uL 192 192 232    . CMP Latest Ref Rng & Units 04/07/2021 03/24/2021 03/13/2021  Glucose 70 - 99 mg/dL 97 97 132(H)  BUN 8 - 23 mg/dL 20 17 18   Creatinine 0.44 - 1.00 mg/dL 1.02(H) 0.95 0.97  Sodium 135 - 145 mmol/L 140 142 135  Potassium 3.5 - 5.1 mmol/L 4.0 4.2 4.2  Chloride 98 - 111 mmol/L 104 108 101  CO2 22 - 32 mmol/L 24 23 26   Calcium 8.9 - 10.3 mg/dL 9.5 9.1 9.4  Total Protein 6.5 - 8.1 g/dL 7.4 6.8 7.3  Total Bilirubin 0.3 - 1.2 mg/dL 1.0 0.6 0.9  Alkaline Phos 38 - 126 U/L 67 69 63  AST 15 - 41 U/L 19 19 27   ALT 0 - 44 U/L 12 14 29    02/22/2021 Sezary Cell Analysis  9% Sezary-like cells present.   02/22/2021 Flow Cytometry  CD4+/CD7- cells are 21% of the total lymphoid gate. CD4+/CD26- cells are 22% of the total lymphoid gate. The CD4:CD8 ratio of the T-cells is 1.5:1. There is no evidence suggestive of T-cell clonality.  RADIOGRAPHIC STUDIES: I have personally reviewed the radiological images as listed and agreed with the findings in the report. DG Abdomen 1  View  Result Date: 03/13/2021 CLINICAL DATA:  Hematuria, low back pain EXAM: ABDOMEN - 1 VIEW COMPARISON:  None. FINDINGS: The bowel gas pattern is normal. No radio-opaque calculi or other significant radiographic abnormality are seen. IMPRESSION: Negative. Electronically Signed   By: Ulyses Jarred M.D.   On: 03/13/2021 19:13    ASSESSMENT & PLAN:   70 yo with   1) CTCL with previous treatment as noted above. Mycosis fungoides with Sezary syndrome  PLAN: -Discussed pt labwork today, 04/07/2021; blood counts normal and chemistries stable. LDH stable. -Advised pt that one time dose of steroids will not greatly affect her tendinapathy risk from Cipro -Advised pt the risk of tendonapathy can persist for a few months after finishing Cipro. -Advised pt to be cautious and not overdo any exercise with sudden stretches on the tendon. Recommended walking. -Discussed levels of immunocompromisation.  -Recommended sandalwood paste for skin / flares. -Recommended pt avoid hot water showers and use milder soaps / pat dry. -Recommended pt drink 48-64 oz water daily. -Continue Singulair once daily.  -Continue Pepcid and Zyrtec. Benadryl in addition if flare-up prn. -The pt has no prohibitive toxicities from continuing [place] at this time. Will continue to monitor. -Will see back in 4 weeks with C7D1.    FOLLOW UP: Please schedule next 2 cycles of mogamulizumab (4 doses) as per orders. Labs with each treatment MD visit with  day 1 of each cycle of treatment.    All of the patients questions were answered with apparent satisfaction. The patient knows to call the clinic with any problems, questions or concerns.   The total time spent in the appointment was 30 minutes and more than 50% was on counseling and direct patient cares, ordering and mx of chemotherapy      Sullivan Lone MD MS AAHIVMS [facility] Hematology/Oncology Physician [place]  (Office):        [phone] (Work cell):  [phone] (Fax):           [phone]  I have reviewed the above documentation for accuracy and completeness, and I agree with the above.   04/07/2021 11:22 AM  I, Reinaldo Raddle, am acting as scribe for Dr. Sullivan Lone, MD.  .I have reviewed the above documentation for accuracy and completeness, and I agree with the above. Brunetta Genera MD

## 2021-04-07 ENCOUNTER — Inpatient Hospital Stay (HOSPITAL_BASED_OUTPATIENT_CLINIC_OR_DEPARTMENT_OTHER): Payer: Medicare Other | Admitting: Hematology

## 2021-04-07 ENCOUNTER — Other Ambulatory Visit: Payer: Self-pay

## 2021-04-07 ENCOUNTER — Other Ambulatory Visit: Payer: Self-pay | Admitting: Hematology

## 2021-04-07 ENCOUNTER — Inpatient Hospital Stay: Payer: Medicare Other

## 2021-04-07 ENCOUNTER — Inpatient Hospital Stay: Payer: Medicare Other | Attending: Hematology

## 2021-04-07 VITALS — BP 136/69 | HR 72 | Temp 97.8°F | Resp 18 | Wt 137.2 lb

## 2021-04-07 DIAGNOSIS — R519 Headache, unspecified: Secondary | ICD-10-CM | POA: Insufficient documentation

## 2021-04-07 DIAGNOSIS — C8410 Sezary disease, unspecified site: Secondary | ICD-10-CM | POA: Diagnosis not present

## 2021-04-07 DIAGNOSIS — E785 Hyperlipidemia, unspecified: Secondary | ICD-10-CM | POA: Insufficient documentation

## 2021-04-07 DIAGNOSIS — M8580 Other specified disorders of bone density and structure, unspecified site: Secondary | ICD-10-CM | POA: Diagnosis not present

## 2021-04-07 DIAGNOSIS — Z806 Family history of leukemia: Secondary | ICD-10-CM | POA: Insufficient documentation

## 2021-04-07 DIAGNOSIS — Z5112 Encounter for antineoplastic immunotherapy: Secondary | ICD-10-CM | POA: Diagnosis present

## 2021-04-07 DIAGNOSIS — Z809 Family history of malignant neoplasm, unspecified: Secondary | ICD-10-CM | POA: Diagnosis not present

## 2021-04-07 DIAGNOSIS — Z79899 Other long term (current) drug therapy: Secondary | ICD-10-CM | POA: Insufficient documentation

## 2021-04-07 DIAGNOSIS — Z7189 Other specified counseling: Secondary | ICD-10-CM

## 2021-04-07 DIAGNOSIS — K219 Gastro-esophageal reflux disease without esophagitis: Secondary | ICD-10-CM | POA: Insufficient documentation

## 2021-04-07 DIAGNOSIS — Z5111 Encounter for antineoplastic chemotherapy: Secondary | ICD-10-CM | POA: Diagnosis not present

## 2021-04-07 DIAGNOSIS — C8408 Mycosis fungoides, lymph nodes of multiple sites: Secondary | ICD-10-CM

## 2021-04-07 DIAGNOSIS — Z86010 Personal history of colonic polyps: Secondary | ICD-10-CM | POA: Diagnosis not present

## 2021-04-07 DIAGNOSIS — C8400 Mycosis fungoides, unspecified site: Secondary | ICD-10-CM | POA: Diagnosis not present

## 2021-04-07 DIAGNOSIS — C8590 Non-Hodgkin lymphoma, unspecified, unspecified site: Secondary | ICD-10-CM

## 2021-04-07 LAB — CBC WITH DIFFERENTIAL (CANCER CENTER ONLY)
Abs Immature Granulocytes: 0.04 10*3/uL (ref 0.00–0.07)
Basophils Absolute: 0.1 10*3/uL (ref 0.0–0.1)
Basophils Relative: 1 %
Eosinophils Absolute: 1.2 10*3/uL — ABNORMAL HIGH (ref 0.0–0.5)
Eosinophils Relative: 17 %
HCT: 42.2 % (ref 36.0–46.0)
Hemoglobin: 14.8 g/dL (ref 12.0–15.0)
Immature Granulocytes: 1 %
Lymphocytes Relative: 13 %
Lymphs Abs: 0.9 10*3/uL (ref 0.7–4.0)
MCH: 33.7 pg (ref 26.0–34.0)
MCHC: 35.1 g/dL (ref 30.0–36.0)
MCV: 96.1 fL (ref 80.0–100.0)
Monocytes Absolute: 0.6 10*3/uL (ref 0.1–1.0)
Monocytes Relative: 9 %
Neutro Abs: 4.1 10*3/uL (ref 1.7–7.7)
Neutrophils Relative %: 59 %
Platelet Count: 192 10*3/uL (ref 150–400)
RBC: 4.39 MIL/uL (ref 3.87–5.11)
RDW: 12 % (ref 11.5–15.5)
WBC Count: 6.9 10*3/uL (ref 4.0–10.5)
nRBC: 0 % (ref 0.0–0.2)

## 2021-04-07 LAB — CMP (CANCER CENTER ONLY)
ALT: 12 U/L (ref 0–44)
AST: 19 U/L (ref 15–41)
Albumin: 3.9 g/dL (ref 3.5–5.0)
Alkaline Phosphatase: 67 U/L (ref 38–126)
Anion gap: 12 (ref 5–15)
BUN: 20 mg/dL (ref 8–23)
CO2: 24 mmol/L (ref 22–32)
Calcium: 9.5 mg/dL (ref 8.9–10.3)
Chloride: 104 mmol/L (ref 98–111)
Creatinine: 1.02 mg/dL — ABNORMAL HIGH (ref 0.44–1.00)
GFR, Estimated: 60 mL/min — ABNORMAL LOW (ref >60)
Glucose, Bld: 97 mg/dL (ref 70–99)
Potassium: 4 mmol/L (ref 3.5–5.1)
Sodium: 140 mmol/L (ref 135–145)
Total Bilirubin: 1 mg/dL (ref 0.3–1.2)
Total Protein: 7.4 g/dL (ref 6.5–8.1)

## 2021-04-07 LAB — LACTATE DEHYDROGENASE: LDH: 223 U/L — ABNORMAL HIGH (ref 98–192)

## 2021-04-07 MED ORDER — METHYLPREDNISOLONE SODIUM SUCC 125 MG IJ SOLR
125.0000 mg | Freq: Once | INTRAMUSCULAR | Status: AC
  Administered 2021-04-07: 125 mg via INTRAVENOUS

## 2021-04-07 MED ORDER — SODIUM CHLORIDE 0.9 % IV SOLN
1.0000 mg/kg | Freq: Once | INTRAVENOUS | Status: AC
  Administered 2021-04-07: 60 mg via INTRAVENOUS
  Filled 2021-04-07: qty 15

## 2021-04-07 MED ORDER — DIPHENHYDRAMINE HCL 25 MG PO CAPS
ORAL_CAPSULE | ORAL | Status: AC
  Filled 2021-04-07: qty 1

## 2021-04-07 MED ORDER — METHYLPREDNISOLONE SODIUM SUCC 125 MG IJ SOLR
INTRAMUSCULAR | Status: AC
  Filled 2021-04-07: qty 2

## 2021-04-07 MED ORDER — ACETAMINOPHEN 325 MG PO TABS
650.0000 mg | ORAL_TABLET | Freq: Once | ORAL | Status: AC
  Administered 2021-04-07: 650 mg via ORAL

## 2021-04-07 MED ORDER — SODIUM CHLORIDE 0.9 % IV SOLN
INTRAVENOUS | Status: DC
  Filled 2021-04-07: qty 250

## 2021-04-07 MED ORDER — ACETAMINOPHEN 325 MG PO TABS
ORAL_TABLET | ORAL | Status: AC
  Filled 2021-04-07: qty 2

## 2021-04-07 MED ORDER — DIPHENHYDRAMINE HCL 25 MG PO CAPS
25.0000 mg | ORAL_CAPSULE | Freq: Once | ORAL | Status: AC
  Administered 2021-04-07: 25 mg via ORAL

## 2021-04-07 NOTE — Patient Instructions (Signed)
[facility]COLOG[facility]  Discharge Instructions: Thank you for choosing [facility] to provide your oncology and hematology care.   If you have a lab appointment with the [facility], please go directly to the [facility] and check in at the registration area.   Wear comfortable clothing and clothing appropriate for easy access to any Portacath or PICC line.   We strive to give you quality time with your provider. You may need to reschedule your appointment if you arrive late (15 or more minutes).  Arriving late affects you and other patients whose appointments are after yours.  Also, if you miss three or more appointments without notifying the office, you may be dismissed from the clinic at the provider's discretion.      For prescription refill requests, have your pharmacy contact our office and allow 72 hours for refills to be completed.    Today you received the following chemotherapy and/or immunotherapy agents [place]      To help prevent nausea and vomiting after your treatment, we encourage you to take your nausea medication as directed.  BELOW ARE SYMPTOMS THAT SHOULD BE REPORTED IMMEDIATELY: . *FEVER GREATER THAN 100.4 F (38 C) OR HIGHER . *CHILLS OR SWEATING . *NAUSEA AND VOMITING THAT IS NOT CONTROLLED WITH YOUR NAUSEA MEDICATION . *UNUSUAL SHORTNESS OF BREATH . *UNUSUAL BRUISING OR BLEEDING . *URINARY PROBLEMS (pain or burning when urinating, or frequent urination) . *BOWEL PROBLEMS (unusual diarrhea, constipation, pain near the anus) . TENDERNESS IN MOUTH AND THROAT WITH OR WITHOUT PRESENCE OF ULCERS (sore throat, sores in mouth, or a toothache) . UNUSUAL RASH, SWELLING OR PAIN  . UNUSUAL VAGINAL DISCHARGE OR ITCHING   Items with * indicate a potential emergency and should be followed up as soon as possible or go to the Emergency Department if any problems should occur.  Please show the CHEMOTHERAPY ALERT CARD or IMMUNOTHERAPY ALERT  CARD at check-in to the Emergency Department and triage nurse.  Should you have questions after your visit or need to cancel or reschedule your appointment, please contact [facility]  Dept: [phone]  and follow the prompts.  Office hours are 8:00 a.m. to 4:30 p.m. Monday - Friday. Please note that voicemails left after 4:00 p.m. may not be returned until the following business day.  We are closed weekends and major holidays. You have access to a nurse at all times for urgent questions. Please call the main number to the clinic Dept: [phone] and follow the prompts.   For any non-urgent questions, you may also contact your provider using MyChart. We now offer e-Visits for anyone 56 and older to request care online for non-urgent symptoms. For details visit [email or web address].   Also download the MyChart app! Go to the app store, search "MyChart", open the app, select [facility], and log in with your MyChart username and password.  Due to Covid, a mask is required upon entering the hospital/clinic. If you do not have a mask, one will be given to you upon arrival. For doctor visits, patients may have 1 support person aged 59 or older with them. For treatment visits, patients cannot have anyone with them due to current Covid guidelines and our immunocompromised population.

## 2021-04-08 ENCOUNTER — Encounter: Payer: Self-pay | Admitting: Hematology

## 2021-04-08 ENCOUNTER — Ambulatory Visit (INDEPENDENT_AMBULATORY_CARE_PROVIDER_SITE_OTHER): Payer: Medicare Other | Admitting: Sports Medicine

## 2021-04-08 DIAGNOSIS — S76011S Strain of muscle, fascia and tendon of right hip, sequela: Secondary | ICD-10-CM | POA: Diagnosis not present

## 2021-04-08 DIAGNOSIS — M25551 Pain in right hip: Secondary | ICD-10-CM

## 2021-04-08 NOTE — Progress Notes (Addendum)
    SUBJECTIVE:   CHIEF COMPLAINT / HPI:   Alexa [name] is 70 yr old female who presents right hip pain  Right hip pain  Patient does Pilates multiple times a week.  1 month ago she had a popping sensation at the right hip when in a Pilates class.  Initially it was painful and then the pain went away.  She was then put on 5 days of ciprofloxacin for a UTI.  She was able to complete the full course of the antibiotics.  7 days ago she was doing a left quad stretch when she had a tear like "ripping at a Velcro" over the right gluteal muscles.  Then the right gluteal muscles have been tight and uncomfortable.  She has been able to weight-bear and walk however it has been uncomfortable for her.  Sh 20.  She is also noticed weakness in the right lower extremity since injury.  She has stopped doing Pilates due to concern of tendon rupture/tendinitis following taking the ciprofloxacin.  Of note she uses chronic steroids and has twice weekly infusions for T cell lymphoma.   PERTINENT  PMH / PSH: HTN, anxiety, t-cell lymphoma   OBJECTIVE:   BP (!) 150/90   Ht 5\' 3"  (1.6 m)   Wt 137 lb (62.1 kg)   LMP 11/07/2002   BMI 24.27 kg/m    Right hip:  - Inspection: No gross deformity, no swelling, erythema, or ecchymosis - Palpation: Tenderness on palpation of right outer gluteal muscles - ROM: Normal range of motion on Flexion, extension, abduction, internal and external rotation - Strength: Normal strength with the exception of 4/5 strength with resisted hip abduction. - Neuro/vasc: NV intact distally - Special Tests: Positive bilateral Trendelenberg.    Normal gait  Bedside ultrasound: Resolving hematoma surrounding gluteus medius, disorganized fibrous tissue noted  ASSESSMENT/PLAN:   Muscle strain of gluteal region, right, sequela Most likely gluteus medius strain following pilates work out.  Recommended adequate compression of hip with biker shorts.  Also recommend stopping Pilates and any  lower body exercises until next follow-up.  Gentle walking is okay.  Recommended hip abductor exercises along with gluteus minimus and gluteus medius exercises and adequate footwear.  Follow-up in 3 weeks with Dr. Micheline Chapman.     [name], MD [place]  Patient seen and evaluated with the resident.  I agree with the above plan of care.  I would like for her to start her rehabilitation exercises as above and follow-up with me again in 3 weeks.  She will refrain from Pilates but she is okay to do some recreational walking if her pain allows.  Hopefully we can return her to Pilates soon.  She will call with questions or concerns prior to her follow-up visit.

## 2021-04-08 NOTE — Assessment & Plan Note (Addendum)
Most likely gluteus medius strain following pilates work out.  Recommended adequate compression of hip with biker shorts.  Also recommend stopping Pilates and any lower body exercises until next follow-up.  Gentle walking is okay.  Recommended hip abductor exercises along with gluteus minimus and gluteus medius exercises and adequate footwear.  Follow-up in 3 weeks with Dr. Micheline Chapman.

## 2021-04-08 NOTE — Patient Instructions (Addendum)
Thank you for coming to see me today. It was a pleasure. Today we discussed your right hip injury. There is likely a hematoma over the right gluteal muscle which is now resolving. I recommend: Compression with bike shorts  Hip abductor, gluteus minimus, medius exercises Adequate footwear-running sneakers, no flat shoes as this will worsen the lower leg pain Do not do pilates/lower body exercises for now, can do gentle walking  Please follow-up with me in 3 weeks   If you have any questions or concerns, please do not hesitate to call the office at [phone].  Best wishes,   Dr Posey Pronto

## 2021-04-09 ENCOUNTER — Telehealth: Payer: Self-pay | Admitting: Hematology

## 2021-04-09 NOTE — Telephone Encounter (Signed)
Scheduled follow-up appointments per 6/1 los. Patient is aware.

## 2021-04-13 ENCOUNTER — Encounter: Payer: Self-pay | Admitting: Hematology

## 2021-04-21 ENCOUNTER — Inpatient Hospital Stay: Payer: Medicare Other

## 2021-04-21 ENCOUNTER — Other Ambulatory Visit: Payer: Self-pay

## 2021-04-21 VITALS — BP 124/78 | HR 72 | Temp 98.2°F | Resp 18 | Wt 137.0 lb

## 2021-04-21 DIAGNOSIS — C8590 Non-Hodgkin lymphoma, unspecified, unspecified site: Secondary | ICD-10-CM

## 2021-04-21 DIAGNOSIS — Z5112 Encounter for antineoplastic immunotherapy: Secondary | ICD-10-CM | POA: Diagnosis not present

## 2021-04-21 DIAGNOSIS — Z7189 Other specified counseling: Secondary | ICD-10-CM

## 2021-04-21 DIAGNOSIS — C8408 Mycosis fungoides, lymph nodes of multiple sites: Secondary | ICD-10-CM

## 2021-04-21 LAB — CMP (CANCER CENTER ONLY)
ALT: 14 U/L (ref 0–44)
AST: 21 U/L (ref 15–41)
Albumin: 4 g/dL (ref 3.5–5.0)
Alkaline Phosphatase: 66 U/L (ref 38–126)
Anion gap: 10 (ref 5–15)
BUN: 17 mg/dL (ref 8–23)
CO2: 25 mmol/L (ref 22–32)
Calcium: 9.8 mg/dL (ref 8.9–10.3)
Chloride: 104 mmol/L (ref 98–111)
Creatinine: 0.91 mg/dL (ref 0.44–1.00)
GFR, Estimated: >60 mL/min (ref >60)
Glucose, Bld: 82 mg/dL (ref 70–99)
Potassium: 4.2 mmol/L (ref 3.5–5.1)
Sodium: 139 mmol/L (ref 135–145)
Total Bilirubin: 1 mg/dL (ref 0.3–1.2)
Total Protein: 7.3 g/dL (ref 6.5–8.1)

## 2021-04-21 LAB — LACTATE DEHYDROGENASE: LDH: 223 U/L — ABNORMAL HIGH (ref 98–192)

## 2021-04-21 LAB — CBC WITH DIFFERENTIAL (CANCER CENTER ONLY)
Abs Immature Granulocytes: 0.01 10*3/uL (ref 0.00–0.07)
Basophils Absolute: 0 10*3/uL (ref 0.0–0.1)
Basophils Relative: 1 %
Eosinophils Absolute: 0.9 10*3/uL — ABNORMAL HIGH (ref 0.0–0.5)
Eosinophils Relative: 13 %
HCT: 42.1 % (ref 36.0–46.0)
Hemoglobin: 14.9 g/dL (ref 12.0–15.0)
Immature Granulocytes: 0 %
Lymphocytes Relative: 16 %
Lymphs Abs: 1 10*3/uL (ref 0.7–4.0)
MCH: 33.5 pg (ref 26.0–34.0)
MCHC: 35.4 g/dL (ref 30.0–36.0)
MCV: 94.6 fL (ref 80.0–100.0)
Monocytes Absolute: 0.6 10*3/uL (ref 0.1–1.0)
Monocytes Relative: 10 %
Neutro Abs: 4 10*3/uL (ref 1.7–7.7)
Neutrophils Relative %: 60 %
Platelet Count: 191 10*3/uL (ref 150–400)
RBC: 4.45 MIL/uL (ref 3.87–5.11)
RDW: 11.9 % (ref 11.5–15.5)
WBC Count: 6.6 10*3/uL (ref 4.0–10.5)
nRBC: 0 % (ref 0.0–0.2)

## 2021-04-21 MED ORDER — ACETAMINOPHEN 325 MG PO TABS
650.0000 mg | ORAL_TABLET | Freq: Once | ORAL | Status: AC
  Administered 2021-04-21: 650 mg via ORAL

## 2021-04-21 MED ORDER — SODIUM CHLORIDE 0.9 % IV SOLN
1.0000 mg/kg | Freq: Once | INTRAVENOUS | Status: AC
  Administered 2021-04-21: 60 mg via INTRAVENOUS
  Filled 2021-04-21: qty 15

## 2021-04-21 MED ORDER — DIPHENHYDRAMINE HCL 25 MG PO CAPS
25.0000 mg | ORAL_CAPSULE | Freq: Once | ORAL | Status: AC
  Administered 2021-04-21: 25 mg via ORAL

## 2021-04-21 MED ORDER — METHYLPREDNISOLONE SODIUM SUCC 125 MG IJ SOLR
125.0000 mg | Freq: Once | INTRAMUSCULAR | Status: AC
  Administered 2021-04-21: 125 mg via INTRAVENOUS

## 2021-04-21 NOTE — Patient Instructions (Signed)
[facility]  Discharge Instructions: Thank you for choosing [facility] to provide your oncology and hematology care.   If you have a lab appointment with the [facility], please go directly to the [facility] and check in at the registration area.   Wear comfortable clothing and clothing appropriate for easy access to any Portacath or PICC line.   We strive to give you quality time with your provider. You may need to reschedule your appointment if you arrive late (15 or more minutes).  Arriving late affects you and other patients whose appointments are after yours.  Also, if you miss three or more appointments without notifying the office, you may be dismissed from the clinic at the provider's discretion.      For prescription refill requests, have your pharmacy contact our office and allow 72 hours for refills to be completed.    Today you received the following chemotherapy and/or immunotherapy agents Poteligeo      To help prevent nausea and vomiting after your treatment, we encourage you to take your nausea medication as directed.  BELOW ARE SYMPTOMS THAT SHOULD BE REPORTED IMMEDIATELY: *FEVER GREATER THAN 100.4 F (38 C) OR HIGHER *CHILLS OR SWEATING *NAUSEA AND VOMITING THAT IS NOT CONTROLLED WITH YOUR NAUSEA MEDICATION *UNUSUAL SHORTNESS OF BREATH *UNUSUAL BRUISING OR BLEEDING *URINARY PROBLEMS (pain or burning when urinating, or frequent urination) *BOWEL PROBLEMS (unusual diarrhea, constipation, pain near the anus) TENDERNESS IN MOUTH AND THROAT WITH OR WITHOUT PRESENCE OF ULCERS (sore throat, sores in mouth, or a toothache) UNUSUAL RASH, SWELLING OR PAIN  UNUSUAL VAGINAL DISCHARGE OR ITCHING   Items with * indicate a potential emergency and should be followed up as soon as possible or go to the Emergency Department if any problems should occur.  Please show the CHEMOTHERAPY ALERT CARD or IMMUNOTHERAPY ALERT CARD at check-in to  the Emergency Department and triage nurse.  Should you have questions after your visit or need to cancel or reschedule your appointment, please contact [facility]  Dept: [phone]  and follow the prompts.  Office hours are 8:00 a.m. to 4:30 p.m. Monday - Friday. Please note that voicemails left after 4:00 p.m. may not be returned until the following business day.  We are closed weekends and major holidays. You have access to a nurse at all times for urgent questions. Please call the main number to the clinic Dept: [phone] and follow the prompts.   For any non-urgent questions, you may also contact your provider using MyChart. We now offer e-Visits for anyone 72 and older to request care online for non-urgent symptoms. For details visit [email or web address].   Also download the MyChart app! Go to the app store, search "MyChart", open the app, select [facility], and log in with your MyChart username and password.  Due to Covid, a mask is required upon entering the hospital/clinic. If you do not have a mask, one will be given to you upon arrival. For doctor visits, patients may have 1 support person aged 80 or older with them. For treatment visits, patients cannot have anyone with them due to current Covid guidelines and our immunocompromised population.

## 2021-04-27 ENCOUNTER — Other Ambulatory Visit: Payer: Self-pay

## 2021-04-27 ENCOUNTER — Ambulatory Visit (INDEPENDENT_AMBULATORY_CARE_PROVIDER_SITE_OTHER): Payer: Medicare Other | Admitting: Sports Medicine

## 2021-04-27 ENCOUNTER — Encounter: Payer: Self-pay | Admitting: Sports Medicine

## 2021-04-27 DIAGNOSIS — S76011S Strain of muscle, fascia and tendon of right hip, sequela: Secondary | ICD-10-CM

## 2021-04-27 DIAGNOSIS — M25551 Pain in right hip: Secondary | ICD-10-CM

## 2021-04-27 NOTE — Progress Notes (Signed)
   Subjective:   Patient ID: Alexa [name]    DOB: 1951-08-16, 70 y.o. female   MRN: [id number]  D[place]ing is a 70 y.o. female here for follow-up  Patient seen on 04/08/2021 for right buttocks pain and noted to have a muscle strain of the gluteal region after Pilates workout.  She was recommended to treat this with compression with biker shorts, discontinuation of Pilates at this time, gentle walking, and hip abductor exercises with adequate footwear.  Today she notes improvement in her symptoms.  Compression shorts have been extremely helpful.  She has been diligent with doing her home exercises.  She is anxious to return to Pilates.  Review of Systems:  Per HPI.   Objective:   BP 126/70   Ht 5\' 3"  (1.6 m)   Wt 135 lb (61.2 kg)   LMP 11/07/2002   BMI 23.91 kg/m  Vitals and nursing note reviewed.  General: pleasant older woman, sitting comfortably on exam bed, well nourished, well developed, in no acute distress with non-toxic appearance Resp: breathing comfortably on room air, speaking in full sentences Skin: warm, dry Extremities: warm and well perfused, normal tone MSK: gait normal  Right Hip:  - Inspection: No gross deformity, no swelling, erythema, or ecchymosis - Palpation: Mild TTP to more medial upper portion of glute area, no pain over piriformis or greater trochanter or lateral gluteus medius - ROM: Normal range of motion on Flexion, extension, abduction, internal and external rotation - Strength: 4/5 strength in extension and abduction, 5/5 strength in flexion - Neuro/vasc: NV intact distally - Special Tests: Negative FABER and FADIR. Positive Trendelenberg on right. Negative piriformis stretch.  Left Hip:  - Inspection: No gross deformity, no swelling, erythema, or ecchymosis - Palpation: No TTP - ROM: Normal range of motion on Flexion, extension, abduction, internal and external rotation - Strength: 4/5 strength in extension and abduction, 5/5  strength in flexion - Neuro/vasc: NV intact distally - Special Tests: Negative FABER and FADIR. Negative Trendelenberg.  Neuro: Alert and oriented, speech normal  Assessment & Plan:   Muscle strain of gluteal region, right, sequela Acute, improving. Still some evidence of weakness to gluteus muscles. Recommended continuing strengthening exercises and compression shorts. Patient is eager to restart Pilates and feels confident her instructors can help her continue to recover and avoid reinjuring. Discussed exercises to avoid. Recommended follow up as needed. Can consider formal PT referral if desired by patient. She voiced understanding and agreement with plan.  No orders of the defined types were placed in this encounter.  No orders of the defined types were placed in this encounter.     Alexa Marble, DO PGY-3, [facility] 04/27/2021 3:37 PM   Patient seen and evaluated with the resident.  I agree with the above plan of care.  Patient is improving but still has some weakness with gluteus medius stressing.  Treatment as above.  I think she can return to some limited Pilates using pain as her guide.  If symptoms return, consider formal physical therapy with Alexa [name].  Follow-up for ongoing or Issues.

## 2021-04-27 NOTE — Patient Instructions (Signed)
We are so glad you are doing better. I caution you not to jump in to fast as you can re-injure your muscle (gluteus medius) I recommend that you continue to wear your compression shorts Continue the hip abductor strengthening exercises Avoid running upstairs for now, at least for another 3-4 weeks Please be sure to start slow when restarting pilates. Focus on continuing to strengthen your gluteus muscles. It is okay to do exercises that have your right leg free floating but try to avoid exercises that have that right foot planted as this can strain your muscle.  It is okay to gently stretch but I advice that you go to wear it hurts and stop.  Reach out to me if you become interested in formal Physical therapy.

## 2021-04-27 NOTE — Assessment & Plan Note (Signed)
Acute, improving. Still some evidence of weakness to gluteus muscles. Recommended continuing strengthening exercises and compression shorts. Patient is eager to restart Pilates and feels confident her instructors can help her continue to recover and avoid reinjuring. Discussed exercises to avoid. Recommended follow up as needed. Can consider formal PT referral if desired by patient. She voiced understanding and agreement with plan.

## 2021-05-03 ENCOUNTER — Encounter: Payer: Self-pay | Admitting: Hematology

## 2021-05-04 ENCOUNTER — Telehealth: Payer: Self-pay | Admitting: Hematology

## 2021-05-04 ENCOUNTER — Telehealth: Payer: Self-pay | Admitting: *Deleted

## 2021-05-04 ENCOUNTER — Encounter: Payer: Self-pay | Admitting: Hematology

## 2021-05-04 ENCOUNTER — Other Ambulatory Visit: Payer: Self-pay | Admitting: *Deleted

## 2021-05-04 DIAGNOSIS — C8590 Non-Hodgkin lymphoma, unspecified, unspecified site: Secondary | ICD-10-CM

## 2021-05-04 NOTE — Progress Notes (Signed)
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 She is a patient of Dr. Irene Limbo. She is here in clinic today for cycle 7.  Her itching has significantly improved almost resolved.  She is very grateful about that.  She gets a very severe headache the evening or the next day after the infusions.  She intends to take Tylenol after this treatment.  She will had a burst blood vessel on the content of her last treatment.  ALLERGIES:  is allergic to diphenhydramine hcl and sudafed [pseudoephedrine hcl].  MEDICATIONS:  Current Outpatient Medications  Medication Sig Dispense Refill   amLODipine (NORVASC) 5 MG tablet Take 1 tablet (5 mg total) by mouth daily. 90 tablet 3   B Complex Vitamins (VITAMIN B COMPLEX) TABS Take by mouth. 5000 UT     Biotin 5000 MCG CAPS      cephALEXin (KEFLEX) 500 MG capsule Take 1 capsule (500 mg total) by mouth 3 (three) times daily. 21 capsule 0   cetirizine (ZYRTEC) 10 MG tablet Take 10 mg by mouth daily.     Cholecalciferol (VITAMIN D) 125 MCG (5000 UT) CAPS      Cod Liver Oil 1000 MG CAPS Take by mouth.     ELDERBERRY PO Take by mouth daily.     famotidine (PEPCID) 20 MG tablet Take 20 mg by  mouth 2 (two) times daily.     metoprolol tartrate (LOPRESSOR) 25 MG tablet Take 0.5 tablets (12.5 mg total) by mouth daily.     Nutritional Supplements (CHLORELLA-SPIRULINA COMPLEX) TABS Take 1 tablet by mouth daily.     predniSONE (DELTASONE) 50 MG tablet Take 1 tab (50mg ) orally with breakfast for 5 days starting the morning after each Mogamulizumab treatment 30 tablet 0   sertraline (ZOLOFT) 25 MG tablet Take by mouth.     sodium chloride 0.9 % SOLN 250 mL with mogamulizumab-kpkc 20 MG/5ML SOLN 1 mg/kg Inject 1 mg/kg into the vein.     triamcinolone cream (KENALOG) 0.1 %      UNABLE TO FIND Wheat grass     No current facility-administered medications for this visit.    PHYSICAL EXAMINATION: ECOG PERFORMANCE STATUS: 1 - Symptomatic but completely ambulatory  Vitals:   05/05/21 1035  BP: (!) 154/75  Pulse: 64  Resp: 18  Temp: 97.7 F (36.5 C)  SpO2: 98%   Filed Weights   05/05/21 1035  Weight: 135 lb 11.2 oz (61.6 kg)     LABORATORY DATA:  I have reviewed the data as listed CMP Latest Ref Rng & Units 05/05/2021 04/21/2021 04/07/2021  Glucose 70 - 99 mg/dL 90 82 97  BUN 8 - 23 mg/dL 19  17 20  Creatinine 0.44 - 1.00 mg/dL 0.99 0.91 1.02(H)  Sodium 135 - 145 mmol/L 140 139 140  Potassium 3.5 - 5.1 mmol/L 4.3 4.2 4.0  Chloride 98 - 111 mmol/L 105 104 104  CO2 22 - 32 mmol/L 25 25 24   Calcium 8.9 - 10.3 mg/dL 9.4 9.8 9.5  Total Protein 6.5 - 8.1 g/dL 7.3 7.3 7.4  Total Bilirubin 0.3 - 1.2 mg/dL 1.0 1.0 1.0  Alkaline Phos 38 - 126 U/L 65 66 67  AST 15 - 41 U/L 20 21 19   ALT 0 - 44 U/L 12 14 12     Lab Results  Component Value Date   WBC 4.5 05/05/2021   HGB 14.6 05/05/2021   HCT 41.4 05/05/2021   MCV 95.0 05/05/2021   PLT 217 05/05/2021   NEUTROABS 2.9 05/05/2021    ASSESSMENT & PLAN:  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   02/22/2021 Flow Cytometry CD4+/CD7- cells are 21% of the total lymphoid gate. CD4+/CD26-  cells are 22% of the total lymphoid gate. The CD4:CD8 ratio of the T-cells is 1.5:1. There is no evidence suggestive of T-cell clonality. Prior treatment:Brentuximab in July for 6 treatments  Current treatment: Mogamulizumab day 1 and day 15, today is cycle 7 day 1  Toxicities: Headaches posttreatment: I asked her to check her blood pressure.  She will take Tylenol today. Return to clinic every 2 weeks for treatment in 4 weeks to see Dr. Irene Limbo    No orders of the defined types were placed in this encounter.  The patient has a good understanding of the overall plan. she agrees with it. she will call with any problems that may develop before the next visit here.  Total time spent: 30 mins including face to face time and time spent for planning, charting and coordination of care  Rulon Eisenmenger, MD, MPH 05/05/2021  I, Reinaldo Raddle, am acting as scribe for Dr. Nicholas Lose, MD. I have reviewed the above documentation for accuracy and completeness, and I agree with the above.

## 2021-05-04 NOTE — Telephone Encounter (Signed)
Patient called back to advise that she took an at home COVID test and it was negative.

## 2021-05-04 NOTE — Telephone Encounter (Signed)
Changed MD appointment due to provider out of town. Patient is aware of changes.

## 2021-05-05 ENCOUNTER — Other Ambulatory Visit: Payer: Self-pay | Admitting: Hematology and Oncology

## 2021-05-05 ENCOUNTER — Other Ambulatory Visit: Payer: Self-pay

## 2021-05-05 ENCOUNTER — Ambulatory Visit: Payer: Medicare Other | Admitting: Hematology

## 2021-05-05 ENCOUNTER — Telehealth: Payer: Self-pay

## 2021-05-05 ENCOUNTER — Inpatient Hospital Stay (HOSPITAL_BASED_OUTPATIENT_CLINIC_OR_DEPARTMENT_OTHER): Payer: Medicare Other | Admitting: Hematology and Oncology

## 2021-05-05 ENCOUNTER — Encounter: Payer: Self-pay | Admitting: Hematology

## 2021-05-05 ENCOUNTER — Inpatient Hospital Stay: Payer: Medicare Other

## 2021-05-05 DIAGNOSIS — C8590 Non-Hodgkin lymphoma, unspecified, unspecified site: Secondary | ICD-10-CM | POA: Diagnosis not present

## 2021-05-05 DIAGNOSIS — C8408 Mycosis fungoides, lymph nodes of multiple sites: Secondary | ICD-10-CM

## 2021-05-05 DIAGNOSIS — Z7189 Other specified counseling: Secondary | ICD-10-CM

## 2021-05-05 DIAGNOSIS — Z5112 Encounter for antineoplastic immunotherapy: Secondary | ICD-10-CM | POA: Diagnosis not present

## 2021-05-05 LAB — CBC WITH DIFFERENTIAL (CANCER CENTER ONLY)
Abs Immature Granulocytes: 0.01 10*3/uL (ref 0.00–0.07)
Basophils Absolute: 0 10*3/uL (ref 0.0–0.1)
Basophils Relative: 1 %
Eosinophils Absolute: 0.4 10*3/uL (ref 0.0–0.5)
Eosinophils Relative: 9 %
HCT: 41.4 % (ref 36.0–46.0)
Hemoglobin: 14.6 g/dL (ref 12.0–15.0)
Immature Granulocytes: 0 %
Lymphocytes Relative: 19 %
Lymphs Abs: 0.9 10*3/uL (ref 0.7–4.0)
MCH: 33.5 pg (ref 26.0–34.0)
MCHC: 35.3 g/dL (ref 30.0–36.0)
MCV: 95 fL (ref 80.0–100.0)
Monocytes Absolute: 0.4 10*3/uL (ref 0.1–1.0)
Monocytes Relative: 9 %
Neutro Abs: 2.9 10*3/uL (ref 1.7–7.7)
Neutrophils Relative %: 62 %
Platelet Count: 217 10*3/uL (ref 150–400)
RBC: 4.36 MIL/uL (ref 3.87–5.11)
RDW: 11.8 % (ref 11.5–15.5)
WBC Count: 4.5 10*3/uL (ref 4.0–10.5)
nRBC: 0 % (ref 0.0–0.2)

## 2021-05-05 LAB — CMP (CANCER CENTER ONLY)
ALT: 12 U/L (ref 0–44)
AST: 20 U/L (ref 15–41)
Albumin: 3.8 g/dL (ref 3.5–5.0)
Alkaline Phosphatase: 65 U/L (ref 38–126)
Anion gap: 10 (ref 5–15)
BUN: 19 mg/dL (ref 8–23)
CO2: 25 mmol/L (ref 22–32)
Calcium: 9.4 mg/dL (ref 8.9–10.3)
Chloride: 105 mmol/L (ref 98–111)
Creatinine: 0.99 mg/dL (ref 0.44–1.00)
GFR, Estimated: >60 mL/min (ref >60)
Glucose, Bld: 90 mg/dL (ref 70–99)
Potassium: 4.3 mmol/L (ref 3.5–5.1)
Sodium: 140 mmol/L (ref 135–145)
Total Bilirubin: 1 mg/dL (ref 0.3–1.2)
Total Protein: 7.3 g/dL (ref 6.5–8.1)

## 2021-05-05 MED ORDER — DIPHENHYDRAMINE HCL 25 MG PO CAPS
ORAL_CAPSULE | ORAL | Status: AC
  Filled 2021-05-05: qty 1

## 2021-05-05 MED ORDER — METHYLPREDNISOLONE SODIUM SUCC 125 MG IJ SOLR
INTRAMUSCULAR | Status: AC
  Filled 2021-05-05: qty 2

## 2021-05-05 MED ORDER — DIPHENHYDRAMINE HCL 25 MG PO CAPS
25.0000 mg | ORAL_CAPSULE | Freq: Once | ORAL | Status: AC
Start: 2021-05-05 — End: 2021-05-05
  Administered 2021-05-05: 25 mg via ORAL

## 2021-05-05 MED ORDER — ACETAMINOPHEN 325 MG PO TABS
650.0000 mg | ORAL_TABLET | Freq: Once | ORAL | Status: AC
  Administered 2021-05-05: 650 mg via ORAL

## 2021-05-05 MED ORDER — SODIUM CHLORIDE 0.9 % IV SOLN
1.0000 mg/kg | Freq: Once | INTRAVENOUS | Status: AC
  Administered 2021-05-05: 60 mg via INTRAVENOUS
  Filled 2021-05-05: qty 15

## 2021-05-05 MED ORDER — METHYLPREDNISOLONE SODIUM SUCC 125 MG IJ SOLR
125.0000 mg | Freq: Once | INTRAMUSCULAR | Status: AC
  Administered 2021-05-05: 125 mg via INTRAVENOUS

## 2021-05-05 MED ORDER — SODIUM CHLORIDE 0.9 % IV SOLN
INTRAVENOUS | Status: DC
  Filled 2021-05-05: qty 250

## 2021-05-05 MED ORDER — ACETAMINOPHEN 325 MG PO TABS
ORAL_TABLET | ORAL | Status: AC
  Filled 2021-05-05: qty 2

## 2021-05-05 NOTE — Telephone Encounter (Signed)
Pt aware.

## 2021-05-05 NOTE — Telephone Encounter (Signed)
Pt called and left a vm stating that she believes her antibody medication is causing her to have sever headaches. Pt stated it feels like a tightness in her temples. Pt also stated that she recently had a blood vessel in her I break and when she saw her PCP, he said it may be her blood pressure. Patient has not checked her blood pressure yet during the headaches but is planning to do so the next time it happens. Patient also would like to know if she can take an additional dose of the amlodipine or metoprolol when this happens the next time. Please advise.

## 2021-05-05 NOTE — Patient Instructions (Signed)
[facility]  Discharge Instructions: Thank you for choosing [facility] to provide your oncology and hematology care.   If you have a lab appointment with the [facility], please go directly to the [facility] and check in at the registration area.   Wear comfortable clothing and clothing appropriate for easy access to any Portacath or PICC line.   We strive to give you quality time with your provider. You may need to reschedule your appointment if you arrive late (15 or more minutes).  Arriving late affects you and other patients whose appointments are after yours.  Also, if you miss three or more appointments without notifying the office, you may be dismissed from the clinic at the provider's discretion.      For prescription refill requests, have your pharmacy contact our office and allow 72 hours for refills to be completed.    Today you received the following chemotherapy and/or immunotherapy agents Poteligeo      To help prevent nausea and vomiting after your treatment, we encourage you to take your nausea medication as directed.  BELOW ARE SYMPTOMS THAT SHOULD BE REPORTED IMMEDIATELY: *FEVER GREATER THAN 100.4 F (38 C) OR HIGHER *CHILLS OR SWEATING *NAUSEA AND VOMITING THAT IS NOT CONTROLLED WITH YOUR NAUSEA MEDICATION *UNUSUAL SHORTNESS OF BREATH *UNUSUAL BRUISING OR BLEEDING *URINARY PROBLEMS (pain or burning when urinating, or frequent urination) *BOWEL PROBLEMS (unusual diarrhea, constipation, pain near the anus) TENDERNESS IN MOUTH AND THROAT WITH OR WITHOUT PRESENCE OF ULCERS (sore throat, sores in mouth, or a toothache) UNUSUAL RASH, SWELLING OR PAIN  UNUSUAL VAGINAL DISCHARGE OR ITCHING   Items with * indicate a potential emergency and should be followed up as soon as possible or go to the Emergency Department if any problems should occur.  Please show the CHEMOTHERAPY ALERT CARD or IMMUNOTHERAPY ALERT CARD at check-in to  the Emergency Department and triage nurse.  Should you have questions after your visit or need to cancel or reschedule your appointment, please contact [facility]  Dept: [phone]  and follow the prompts.  Office hours are 8:00 a.m. to 4:30 p.m. Monday - Friday. Please note that voicemails left after 4:00 p.m. may not be returned until the following business day.  We are closed weekends and major holidays. You have access to a nurse at all times for urgent questions. Please call the main number to the clinic Dept: [phone] and follow the prompts.   For any non-urgent questions, you may also contact your provider using MyChart. We now offer e-Visits for anyone 95 and older to request care online for non-urgent symptoms. For details visit [email or web address].   Also download the MyChart app! Go to the app store, search "MyChart", open the app, select [facility], and log in with your MyChart username and password.  Due to Covid, a mask is required upon entering the hospital/clinic. If you do not have a mask, one will be given to you upon arrival. For doctor visits, patients may have 1 support person aged 35 or older with them. For treatment visits, patients cannot have anyone with them due to current Covid guidelines and our immunocompromised population.

## 2021-05-05 NOTE — Telephone Encounter (Signed)
As we do not manage her antibiotic medication, recommend she get in contact with provider who does regarding her concerns as well.  In regard to blood pressure, please advise patient to notify us of her blood pressure readings when she does have a headache and we can advise further.  If blood pressure is >140/90 mmHg she may take additional 5 mg of amlodipine for total of 10 mg in a day.

## 2021-05-05 NOTE — Assessment & Plan Note (Signed)
02/22/2021 Sezary Cell Analysis 9% Sezary-like cells present.  02/22/2021 Flow Cytometry CD4+/CD7- cells are 21% of the total lymphoid gate. CD4+/CD26- cells are 22% of the total lymphoid gate. The CD4:CD8 ratio of the T-cells is 1.5:1. There is no evidence suggestive of T-cell clonality. Prior treatment:Brentuximab in July for 6 treatments  Current treatment: Mogamulizumab day 1 and day 15, today is cycle 7 day 1  Toxicities:

## 2021-05-07 ENCOUNTER — Encounter: Payer: Self-pay | Admitting: Hematology

## 2021-05-11 ENCOUNTER — Telehealth: Payer: Self-pay | Admitting: Sports Medicine

## 2021-05-11 NOTE — Telephone Encounter (Signed)
Patient call ask if Dr. Micheline Chapman would advise her if she can use a Bone Roller during exercise (piliates)

## 2021-05-12 ENCOUNTER — Encounter (HOSPITAL_BASED_OUTPATIENT_CLINIC_OR_DEPARTMENT_OTHER): Payer: Self-pay | Admitting: Obstetrics & Gynecology

## 2021-05-17 ENCOUNTER — Encounter (HOSPITAL_BASED_OUTPATIENT_CLINIC_OR_DEPARTMENT_OTHER): Payer: Self-pay | Admitting: Obstetrics & Gynecology

## 2021-05-17 ENCOUNTER — Other Ambulatory Visit: Payer: Self-pay

## 2021-05-17 ENCOUNTER — Telehealth (HOSPITAL_BASED_OUTPATIENT_CLINIC_OR_DEPARTMENT_OTHER): Payer: Self-pay

## 2021-05-17 ENCOUNTER — Other Ambulatory Visit: Payer: Self-pay | Admitting: Family Medicine

## 2021-05-17 ENCOUNTER — Ambulatory Visit (INDEPENDENT_AMBULATORY_CARE_PROVIDER_SITE_OTHER): Payer: Medicare Other | Admitting: Obstetrics & Gynecology

## 2021-05-17 VITALS — BP 151/75 | HR 65 | Ht 62.5 in | Wt 135.0 lb

## 2021-05-17 DIAGNOSIS — C8590 Non-Hodgkin lymphoma, unspecified, unspecified site: Secondary | ICD-10-CM

## 2021-05-17 DIAGNOSIS — F418 Other specified anxiety disorders: Secondary | ICD-10-CM | POA: Diagnosis not present

## 2021-05-17 DIAGNOSIS — R590 Localized enlarged lymph nodes: Secondary | ICD-10-CM

## 2021-05-17 DIAGNOSIS — Z9189 Other specified personal risk factors, not elsewhere classified: Secondary | ICD-10-CM

## 2021-05-17 DIAGNOSIS — Z85048 Personal history of other malignant neoplasm of rectum, rectosigmoid junction, and anus: Secondary | ICD-10-CM

## 2021-05-17 DIAGNOSIS — Z780 Asymptomatic menopausal state: Secondary | ICD-10-CM

## 2021-05-17 DIAGNOSIS — M810 Age-related osteoporosis without current pathological fracture: Secondary | ICD-10-CM

## 2021-05-17 DIAGNOSIS — Z01419 Encounter for gynecological examination (general) (routine) without abnormal findings: Secondary | ICD-10-CM

## 2021-05-17 MED ORDER — PAROXETINE HCL 10 MG PO TABS: 10.0000 mg | ORAL_TABLET | Freq: Every day | ORAL | 5 refills | Status: DC

## 2021-05-17 NOTE — Telephone Encounter (Signed)
Patient called back today concerning her mammogram. She states that the scheduler can not schedule her mammogram because of the issue she had last year. Scheduler could not tell if patient needed a regular screening mammogram or a diagnostic. She states that the scheduler told her to reach back out to Dr. Sabra Heck and have her to put an order in for what she wants patient to have. tbw

## 2021-05-17 NOTE — Patient Instructions (Signed)
Check with cardiology about paxil and metroprolol  My records show your tetanus is due this year.  Is this ok with oncology?

## 2021-05-17 NOTE — Progress Notes (Signed)
70 y.o. G1P0 Single White or Caucasian female here for breast and pelvic exam.  Just had follow up colonoscopy 05/12/2021.  This was negative.  Repeat recommended in 5 years.    Last year, had lymphadenopathy on 04/2020 MMG.  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  Pathology reviewed.  Notes from Dr. Lindi Adie and Irene Limbo reviewed from 2021 and 2022  Denies vaginal bleeding.  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  Most recent blood work was good.    Feels she could benefit being back on something for anxiety.  She has used paxil in the past and would like to restart.  There is a drug interaction with metoprolol.  Discussed with pt.  She would like discuss with cardiology tomorrow.   Patient's last menstrual period was 11/07/2002.          Sexually active: No.  H/O STD:  no  Health Maintenance: PCP:  Dr. Melford Aase.  Last wellness appt was 11/2020.  Did blood work at that appt:  yes Vaccines are up to date:  discussed with pt, tdap due this year Colonoscopy:  05/12/2021, follow up 5 years BMD:  04/24/2020, -2.5 MMG:  04/24/2020, adenopathy noted Last pap smear:  05/24/20.   H/o abnormal pap smear:  remote hx   reports that she has never smoked. She has never used smokeless tobacco. She reports current alcohol use. She reports that she does not use drugs.  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[place])    Osteopenia    bilateral hips   Rectal cancer ([place]) 04/2017    Past Surgical History:  Procedure Laterality Date   COLONOSCOPY  5/13   with biopsy    DILATATION & CURRETTAGE/HYSTEROSCOPY WITH RESECTOCOPE N/A 06/02/2014   Procedure: DILATATION & CURETTAGE/HYSTEROSCOPY WITH RESECTOCOPE;  Surgeon: Lyman Speller, MD;  Location: [facility];  Service: Gynecology;  Laterality: N/A;   FOOT SURGERY     Right    TRANSANAL EXCISION OF RECTAL MASS N/A 06/09/2017   Procedure: TRANSANAL EXCISION OF RECTAL MASS;  Surgeon: [name], MD;  Location: [facility];  Service: General;  Laterality: N/A;    Current Outpatient Medications  Medication Sig Dispense Refill   B Complex Vitamins (VITAMIN B COMPLEX) TABS Take by mouth. 5000 UT     Biotin 5000 MCG CAPS      cetirizine (ZYRTEC) 10 MG tablet Take 10 mg by mouth daily.     Cholecalciferol (VITAMIN D) 125 MCG (5000 UT) CAPS      Cod Liver Oil 1000 MG CAPS Take by mouth.     ELDERBERRY PO Take by mouth daily.     famotidine (PEPCID) 20 MG tablet Take 20 mg by mouth 2 (two) times daily.     predniSONE (DELTASONE) 50 MG tablet Take 1 tab (50mg ) orally with breakfast for 5 days starting the morning after each Mogamulizumab treatment 30 tablet 0   Probiotic Product (PROBIOTIC DAILY PO) Take by mouth.     sertraline (  ZOLOFT) 25 MG tablet Take by mouth.     sodium chloride 0.9 % SOLN 250 mL with mogamulizumab-kpkc 20 MG/5ML SOLN 1 mg/kg Inject 1 mg/kg into the vein.     triamcinolone cream (KENALOG) 0.1 %      amLODipine (NORVASC) 5 MG tablet Take 1 tablet (5 mg total) by mouth daily. 90 tablet 3   metoprolol tartrate (LOPRESSOR) 25 MG tablet Take 0.5 tablets (12.5 mg total) by mouth daily.     No current facility-administered medications for this visit.    Family History  Problem Relation Age of Onset   Anuerysm Father    Heart disease Father    Diabetes Father    Cancer Father        unknown type   Hypertension Father    Heart attack Mother    Heart disease Mother    Kidney disease Mother    Kidney cancer Mother        contained tumor   Hypertension Mother    Heart attack  Brother    Cancer Brother        liver cancer   Melanoma Brother    Other Brother        Bypass surgery    Hypertension Sister    Ovarian cancer Paternal Grandmother        was PMP   Colon cancer Neg Hx     Review of Systems  Constitutional: Negative.   Gastrointestinal: Negative.   Genitourinary: Negative.    Exam:   BP (!) 151/75   Pulse 65   Ht 5' 2.5" (1.588 m)   Wt 135 lb (61.2 kg)   LMP 11/07/2002   BMI 24.30 kg/m   Height: 5' 2.5" (158.8 cm)  General appearance: alert, cooperative and appears stated age Breasts: normal appearance, no masses or tenderness Abdomen: soft, non-tender; bowel sounds normal; no masses,  no organomegaly Lymph nodes: Cervical, supraclavicular, and axillary nodes normal.  No abnormal inguinal nodes palpated Neurologic: Grossly normal  Pelvic: External genitalia:  no lesions              Urethra:  normal appearing urethra with no masses, tenderness or lesions              Bartholins and Skenes: normal                 Vagina: normal appearing vagina with atrophic changes and no discharge, no lesions              Cervix: no lesions              Pap taken: No. Bimanual Exam:  Uterus:  normal size, contour, position, consistency, mobility, non-tender              Adnexa: normal adnexa and no mass, fullness, tenderness              Anus:  no visible lesions, rectal deferred as just had colonoscopy last week  Chaperone, Octaviano Batty, CMA, was present for exam.  Assessment/Plan: 1. GYN exam without abnormalities  - pap neg with neg HR HPV 2021.  Not obtained today. - MMG 04/24/2020 with lymphadenopathy that was biopsied showing T cell lymphoma - BMD 04/2020, stable. Repeat next year - colonoscopy just done last week - lab work done with Dr. Melford Aase, dermatology and oncology  2. History of rectal cancer, 4 years since diagnosis  3. Situational anxiety - Rx for paxil 10mg  to pharmacy.  Interaction with metoprolol noted and  she will discuss with  her cardiologist tomorrow.  Other options discussed if need to change.  4. T-cell lymphoma ([place])  5. Axillary lymphadenopathy  6. Postmenopausal - no HRT  7. Age-related osteoporosis without current pathological fracture - plan repeat BMD next year

## 2021-05-18 ENCOUNTER — Encounter: Payer: Self-pay | Admitting: Hematology

## 2021-05-18 ENCOUNTER — Encounter: Payer: Self-pay | Admitting: Student

## 2021-05-18 ENCOUNTER — Ambulatory Visit: Payer: Medicare Other | Admitting: Student

## 2021-05-18 VITALS — BP 126/78 | HR 73 | Temp 98.2°F | Resp 16 | Ht 62.0 in | Wt 136.0 lb

## 2021-05-18 DIAGNOSIS — R002 Palpitations: Secondary | ICD-10-CM

## 2021-05-18 DIAGNOSIS — I10 Essential (primary) hypertension: Secondary | ICD-10-CM

## 2021-05-18 NOTE — Progress Notes (Signed)
Primary Physician/Referring:  Chesley Noon, MD  Patient ID: Alexa [name], female    DOB: Feb 04, 1951, 70 y.o.   MRN: [id number]  Chief Complaint  Patient presents with   Palpitations   Follow-up   HPI:   Alexa [name]  is a 70 y.o. female with history of hypertension, hyperlipidemia, palpitations, mycosis fungoides, T-cell lymphoma, and colorectal cancer.  She was previously seen in our office 03/06/2017 for palpitations and family history of premature coronary artery disease.  Patient presented to our office 10/07/2020 for reevaluation of palpitations.   Patient presents for 85-monthfollow-up of palpitations and hypertension.  Last office visit patient was doing well, palpitations are well controlled blood pressure under control, no changes were made.  Patient continues to do well.  Blood pressure remains well controlled with home readings averaging 115-125/70 mmHg.  She states she has occasional episodes of palpitations a few times per week lasting several seconds, these typically resolve with vagal maneuvers.  Denies chest pain, dyspnea, syncope, near syncope, dizziness.  Past Medical History:  Diagnosis Date   Adenomatous polyp    [place]    cutaneous T-cell lymphoma; [facility]  Family history of anesthesia complication    sister and sister's children postitive for MH!!!!!!, pt tested negative 3-471yrago   GERD (gastroesophageal reflux disease)    History of hypertension    off medication after weight loss   Hyperlipemia    [place]   light box therapy   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[place]   Osteopenia    bilateral hips   [place]   Past Surgical History:  Procedure Laterality Date   COLONOSCOPY  5/13   with biopsy   DILATATION & CURRETTAGE/HYSTEROSCOPY WITH RESECTOCOPE N/A 06/02/2014   Procedure: DILATATION & CURETTAGE/HYSTEROSCOPY WITH RESECTOCOPE;  Surgeon: MaLyman SpellerMD;  [facility];  Service:  Gynecology;  Laterality: N/A;   FOOT SURGERY     Right    TRANSANAL EXCISION OF RECTAL MASS N/A 06/09/2017   Procedure: TRANSANAL EXCISION OF RECTAL MASS;  Surgeon: ThLeighton RuffMD;  [facility] Service: General;  Laterality: N/A;   Family History  Problem Relation Age of Onset   Anuerysm Father    Heart disease Father    Diabetes Father    Cancer Father        unknown type   Hypertension Father    Heart attack Mother    Heart disease Mother    Kidney disease Mother    Kidney cancer Mother        contained tumor   Hypertension Mother    Heart attack Brother    Cancer Brother        liver cancer   Melanoma Brother    Other Brother        Bypass surgery    Hypertension Sister    Ovarian cancer Paternal Grandmother        was PMP   Colon cancer Neg Hx   Father with MI in his 5014snd CHF in his 6010s Brother with MI in early 6066s Social History   Tobacco Use   Smoking status: Never   Smokeless tobacco: Never  Substance Use Topics   Alcohol use: Yes    Alcohol/week: 0.0 - 1.0 standard drinks   Marital Status: Single   ROS  Review of Systems  Constitutional: Negative for malaise/fatigue and weight gain.  Cardiovascular:  Positive for palpitations (rare and brief). Negative for chest pain, claudication, leg swelling, near-syncope, orthopnea, paroxysmal nocturnal dyspnea and syncope.  Gastrointestinal:  Negative for melena.  Neurological:  Negative for dizziness.   Objective  Blood pressure 126/78, pulse 73, temperature 98.2 F (36.8 C), temperature source Temporal, resp. rate 16, height _0  (1.575 m), weight 136 lb (61.7 kg), last menstrual period 11/07/2002, SpO2 98 %.  Vitals with BMI 05/18/2021 05/17/2021 05/05/2021  Height _1  5' 2.5" _2   Weight 136 lbs 135 lbs 135 lbs 11 oz  BMI 24.87 35.57 32.20  Systolic 254 270 623  Diastolic 78 75 75  Pulse 73 65 64     Physical Exam Vitals reviewed.  Cardiovascular:     Rate and  Rhythm: Normal rate and regular rhythm. No extrasystoles are present.    Pulses: Intact distal pulses.          Carotid pulses are 2+ on the right side and 2+ on the left side.      Radial pulses are 2+ on the right side and 2+ on the left side.       Femoral pulses are 2+ on the right side and 2+ on the left side.      Popliteal pulses are 2+ on the right side and 2+ on the left side.       Dorsalis pedis pulses are 2+ on the right side and 2+ on the left side.       Posterior tibial pulses are 2+ on the right side and 2+ on the left side.     Heart sounds: S1 normal and S2 normal. No murmur heard.   No gallop.  Pulmonary:     Effort: Pulmonary effort is normal. No respiratory distress.     Breath sounds: No wheezing, rhonchi or rales.  Musculoskeletal:     Right lower leg: No edema.     Left lower leg: No edema.  Skin:    General: Skin is warm and dry.  Neurological:     Mental Status: She is alert.    Laboratory examination:   Recent Labs    04/07/21 0912 04/21/21 1328 05/05/21 1009  NA 140 139 140  K 4.0 4.2 4.3  CL 104 104 105  CO2 _3 GLUCOSE 97 82 90  BUN _4 CREATININE 1.02* 0.91 0.99  CALCIUM 9.5 9.8 9.4  GFRNONAA 60* >60 >60   estimated creatinine clearance is 47 mL/min (by C-G formula based on SCr of 0.99 mg/dL).  CMP Latest Ref Rng & Units 05/05/2021 04/21/2021 04/07/2021  Glucose 70 - 99 mg/dL 90 82 97  BUN 8 - 23 mg/dL _5 Creatinine 0.44 - 1.00 mg/dL 0.99 0.91 1.02(H)  Sodium 135 - 145 mmol/L 140 139 140  Potassium 3.5 - 5.1 mmol/L 4.3 4.2 4.0  Chloride 98 - 111 mmol/L 105 104 104  CO2 22 - 32 mmol/L _6 Calcium 8.9 - 10.3 mg/dL 9.4 9.8 9.5  Total Protein 6.5 - 8.1 g/dL 7.3 7.3 7.4  Total Bilirubin 0.3 - 1.2 mg/dL 1.0 1.0 1.0  Alkaline Phos 38 - 126 U/L 65 66 67  AST 15 - 41 U/L _7 ALT 0 - 44 U/L _8 CBC Latest Ref Rng & Units 05/05/2021 04/21/2021 04/07/2021  WBC 4.0 - 10.5 K/uL 4.5 6.6 6.9  Hemoglobin 12.0 - 15.0  g/dL 14.6 14.9 14.8  Hematocrit 36.0 -  46.0 % 41.4 42.1 42.2  Platelets 150 - 400 K/uL 217 191 192    Lipid Panel No results for input(s): CHOL, TRIG, LDLCALC, VLDL, HDL, CHOLHDL, LDLDIRECT in the last 8760 hours.  HEMOGLOBIN A1C No results found for: HGBA1C, MPG TSH No results for input(s): TSH in the last 8760 hours.  External labs:  10/28/2020: Total cholesterol 214, triglycerides 142, HDL 53, LDL 136  09/14/2020: Sodium 138, potassium 4.2, creatinine 1.0, BUN 20, EGFR 58 Hemoglobin 13.9, hematocrit 40.3, platelets 257 TSH 2.39  Allergies   Allergies  Allergen Reactions   Ciprofloxacin Other (See Comments)    Gluteus medius tear   Diphenhydramine Hcl     Other reaction(s): Other (See Comments) Pt becomes extremely hyper; pt requested premed for Mogamulizumab per NP   Sudafed [Pseudoephedrine Hcl] Palpitations    Medications Prior to Visit:   Outpatient Medications Prior to Visit  Medication Sig Dispense Refill   amLODipine (NORVASC) 5 MG tablet Take 1 tablet (5 mg total) by mouth daily. 90 tablet 3   B Complex Vitamins (VITAMIN B COMPLEX) TABS Take by mouth. 5000 UT     Biotin 5000 MCG CAPS      cetirizine (ZYRTEC) 10 MG tablet Take 10 mg by mouth daily.     Cholecalciferol (VITAMIN D) 125 MCG (5000 UT) CAPS      Cod Liver Oil 1000 MG CAPS Take by mouth.     ELDERBERRY PO Take by mouth daily.     famotidine (PEPCID) 20 MG tablet Take 20 mg by mouth daily.     metoprolol tartrate (LOPRESSOR) 25 MG tablet Take 0.5 tablets (12.5 mg total) by mouth daily.     predniSONE (DELTASONE) 50 MG tablet Take 1 tab (61m) orally with breakfast for 5 days starting the morning after each Mogamulizumab treatment 30 tablet 0   Probiotic Product (PROBIOTIC DAILY PO) Take by mouth.     sodium chloride 0.9 % SOLN 250 mL with mogamulizumab-kpkc 20 MG/5ML SOLN 1 mg/kg Inject 1 mg/kg into the vein.     triamcinolone cream (KENALOG) 0.1 %      sertraline (ZOLOFT) 25 MG tablet Take by  mouth.     No facility-administered medications prior to visit.   Final Medications at End of Visit    Current Meds  Medication Sig   amLODipine (NORVASC) 5 MG tablet Take 1 tablet (5 mg total) by mouth daily.   B Complex Vitamins (VITAMIN B COMPLEX) TABS Take by mouth. 5000 UT   Biotin 5000 MCG CAPS    cetirizine (ZYRTEC) 10 MG tablet Take 10 mg by mouth daily.   Cholecalciferol (VITAMIN D) 125 MCG (5000 UT) CAPS    Cod Liver Oil 1000 MG CAPS Take by mouth.   ELDERBERRY PO Take by mouth daily.   famotidine (PEPCID) 20 MG tablet Take 20 mg by mouth daily.   metoprolol tartrate (LOPRESSOR) 25 MG tablet Take 0.5 tablets (12.5 mg total) by mouth daily.   predniSONE (DELTASONE) 50 MG tablet Take 1 tab (580m orally with breakfast for 5 days starting the morning after each Mogamulizumab treatment   Probiotic Product (PROBIOTIC DAILY PO) Take by mouth.   sodium chloride 0.9 % SOLN 250 mL with mogamulizumab-kpkc 20 MG/5ML SOLN 1 mg/kg Inject 1 mg/kg into the vein.   triamcinolone cream (KENALOG) 0.1 %    Radiology:   No results found.  Cardiac Studies:   LONG TERM MONITOR 10/07/2020-10/21/2020:  Patient had minimum heart rate of 46 bpm, maximum heart rate  of 23 bpm with an average heart rate of 73 bpm.  Predominant rhythm was sinus.  There were 12 patient triggered events which primarily correlated with sinus rhythm, and occasional ventricular ectopy.  Patient did have symptomatic supraventricular tachycardia with the fastest interval lasting 7 beats at a maximum rate of 203 bpm, longest lasting 8 beats with an average heart rate of 93 bpm.  Rare PVCs and PACs.  Patient also with several brief episodes of atrial tachycardia.  Echocardiogram 02/09/2017: 1.  Left ventricle cavity is normal in size.  Normal global wall motion.  Normal diastolic filling pattern.  Normal LAP.  Calculated EF 50% 2.  Trace mitral regurgitation. 3.  Trace tricuspid regurgitation.  No evidence of pulmonary  hypertension  Stress test 02/13/2017: Resting EKG demonstrates normal sinus rhythm.  The patient exercised according to Bruce protocol, total time recorded 9 minutes achieving maximum heart rate of 169 which was 107% of target heart rate for age and 10.16 METS of work.  Stress terminated due to fatigue, THR met.  Hypertensive blood pressure response.  There was no ST-T changes of ischemia with exercise stress test.  There were no significant arrhythmias.  Normal blood pressure response. No evidence of ischemia by GXT.  Exercise tolerance is normal.  Hypertensive blood pressure response.  Continue preventive therapy.  Bilateral carotid artery duplex 01/30/2017: Minimal amount of bilateral intimal thickening and atherosclerotic plaque, right greater than left, not resulting in a hemodynamically significant stenosis within either internal carotid artery.  EKG:   05/18/2021: Sinus rhythm at a rate of 65 bpm.  Normal axis.  Poor R wave progression, cannot exclude anteroseptal infarct old.  Compared to EKG 10/22/2020, no significant change.  Assessment     ICD-10-CM   1. Palpitations  R00.2 EKG 12-Lead    2. Hypertension, essential  I10        Medications Discontinued During This Encounter  Medication Reason   sertraline (ZOLOFT) 25 MG tablet Error    No orders of the defined types were placed in this encounter.   Recommendations:   Alexa [name] is a 70 y.o. female with history of hypertension, hyperlipidemia, palpitations, mycosis fungoides, T-cell lymphoma, and colorectal cancer.  She was previously seen in our office 03/06/2017 for palpitations and family history of premature coronary artery disease.  She presented to reestablish care 10/07/2020, again for evaluation of palpitations.  Patient presents for 46-monthfollow-up of palpitations and hypertension.  Last office visit patient was doing well, palpitations are well controlled blood pressure under control, no changes were  made.  Patient palpitation symptoms are well controlled with Lopressor, will continue this.  Patient's blood pressure is under excellent control.  Given her family history of premature coronary artery disease discussed with patient undergoing coronary calcium score, she would like to research the procedure and will notify our office if she wishes to proceed with having it done.  Also personally reviewed external labs which reveal lipids are uncontrolled with LDL of 136.  Patient refuses to be on statin medication, therefore discussed the option of starting Zetia 10 mg once daily.  Patient prefers to hold off until she can speak with her oncologist and PCP regarding initiation of Zetia. Will defer further management to PCP.   Patient is otherwise stable from a cardiovascular standpoint.  Follow-up in 1 year, sooner if needed, for palpitations, hypertension, hyperlipidemia.   CAlethia Berthold PA-C 05/18/2021, 1:46 PM Office: [phone]

## 2021-05-19 ENCOUNTER — Telehealth: Payer: Self-pay

## 2021-05-19 ENCOUNTER — Other Ambulatory Visit (HOSPITAL_BASED_OUTPATIENT_CLINIC_OR_DEPARTMENT_OTHER): Payer: Self-pay | Admitting: Obstetrics & Gynecology

## 2021-05-19 ENCOUNTER — Other Ambulatory Visit: Payer: Self-pay

## 2021-05-19 ENCOUNTER — Inpatient Hospital Stay: Payer: Medicare Other

## 2021-05-19 ENCOUNTER — Inpatient Hospital Stay: Payer: Medicare Other | Attending: Hematology

## 2021-05-19 ENCOUNTER — Encounter (HOSPITAL_BASED_OUTPATIENT_CLINIC_OR_DEPARTMENT_OTHER): Payer: Self-pay

## 2021-05-19 VITALS — BP 144/80 | HR 64 | Temp 97.9°F | Resp 16 | Wt 135.5 lb

## 2021-05-19 DIAGNOSIS — Z800 Family history of malignant neoplasm of digestive organs: Secondary | ICD-10-CM | POA: Insufficient documentation

## 2021-05-19 DIAGNOSIS — Z808 Family history of malignant neoplasm of other organs or systems: Secondary | ICD-10-CM | POA: Insufficient documentation

## 2021-05-19 DIAGNOSIS — Z8051 Family history of malignant neoplasm of kidney: Secondary | ICD-10-CM | POA: Diagnosis not present

## 2021-05-19 DIAGNOSIS — Z79899 Other long term (current) drug therapy: Secondary | ICD-10-CM | POA: Insufficient documentation

## 2021-05-19 DIAGNOSIS — Z7952 Long term (current) use of systemic steroids: Secondary | ICD-10-CM | POA: Insufficient documentation

## 2021-05-19 DIAGNOSIS — Z780 Asymptomatic menopausal state: Secondary | ICD-10-CM | POA: Insufficient documentation

## 2021-05-19 DIAGNOSIS — Z5112 Encounter for antineoplastic immunotherapy: Secondary | ICD-10-CM | POA: Diagnosis present

## 2021-05-19 DIAGNOSIS — C8590 Non-Hodgkin lymphoma, unspecified, unspecified site: Secondary | ICD-10-CM

## 2021-05-19 DIAGNOSIS — C8410 Sezary disease, unspecified site: Secondary | ICD-10-CM | POA: Insufficient documentation

## 2021-05-19 DIAGNOSIS — Z7189 Other specified counseling: Secondary | ICD-10-CM

## 2021-05-19 DIAGNOSIS — Z809 Family history of malignant neoplasm, unspecified: Secondary | ICD-10-CM | POA: Insufficient documentation

## 2021-05-19 DIAGNOSIS — C8408 Mycosis fungoides, lymph nodes of multiple sites: Secondary | ICD-10-CM

## 2021-05-19 DIAGNOSIS — Z8041 Family history of malignant neoplasm of ovary: Secondary | ICD-10-CM | POA: Insufficient documentation

## 2021-05-19 DIAGNOSIS — Z1231 Encounter for screening mammogram for malignant neoplasm of breast: Secondary | ICD-10-CM

## 2021-05-19 LAB — CMP (CANCER CENTER ONLY)
ALT: 13 U/L (ref 0–44)
AST: 20 U/L (ref 15–41)
Albumin: 3.9 g/dL (ref 3.5–5.0)
Alkaline Phosphatase: 61 U/L (ref 38–126)
Anion gap: 9 (ref 5–15)
BUN: 22 mg/dL (ref 8–23)
CO2: 26 mmol/L (ref 22–32)
Calcium: 9.4 mg/dL (ref 8.9–10.3)
Chloride: 106 mmol/L (ref 98–111)
Creatinine: 0.95 mg/dL (ref 0.44–1.00)
GFR, Estimated: >60 mL/min (ref >60)
Glucose, Bld: 84 mg/dL (ref 70–99)
Potassium: 4.2 mmol/L (ref 3.5–5.1)
Sodium: 141 mmol/L (ref 135–145)
Total Bilirubin: 0.8 mg/dL (ref 0.3–1.2)
Total Protein: 7.2 g/dL (ref 6.5–8.1)

## 2021-05-19 LAB — CBC WITH DIFFERENTIAL (CANCER CENTER ONLY)
Abs Immature Granulocytes: 0.01 10*3/uL (ref 0.00–0.07)
Basophils Absolute: 0 10*3/uL (ref 0.0–0.1)
Basophils Relative: 1 %
Eosinophils Absolute: 0.3 10*3/uL (ref 0.0–0.5)
Eosinophils Relative: 7 %
HCT: 39.3 % (ref 36.0–46.0)
Hemoglobin: 13.9 g/dL (ref 12.0–15.0)
Immature Granulocytes: 0 %
Lymphocytes Relative: 16 %
Lymphs Abs: 0.7 10*3/uL (ref 0.7–4.0)
MCH: 33.3 pg (ref 26.0–34.0)
MCHC: 35.4 g/dL (ref 30.0–36.0)
MCV: 94 fL (ref 80.0–100.0)
Monocytes Absolute: 0.5 10*3/uL (ref 0.1–1.0)
Monocytes Relative: 11 %
Neutro Abs: 2.9 10*3/uL (ref 1.7–7.7)
Neutrophils Relative %: 65 %
Platelet Count: 200 10*3/uL (ref 150–400)
RBC: 4.18 MIL/uL (ref 3.87–5.11)
RDW: 11.9 % (ref 11.5–15.5)
WBC Count: 4.4 10*3/uL (ref 4.0–10.5)
nRBC: 0 % (ref 0.0–0.2)

## 2021-05-19 LAB — LACTATE DEHYDROGENASE: LDH: 192 U/L (ref 98–192)

## 2021-05-19 MED ORDER — DIPHENHYDRAMINE HCL 25 MG PO CAPS
ORAL_CAPSULE | ORAL | Status: AC
  Filled 2021-05-19: qty 1

## 2021-05-19 MED ORDER — SODIUM CHLORIDE 0.9 % IV SOLN
1.0000 mg/kg | Freq: Once | INTRAVENOUS | Status: AC
  Administered 2021-05-19: 60 mg via INTRAVENOUS
  Filled 2021-05-19: qty 15

## 2021-05-19 MED ORDER — SODIUM CHLORIDE 0.9 % IV SOLN
Freq: Once | INTRAVENOUS | Status: AC
  Filled 2021-05-19: qty 250

## 2021-05-19 MED ORDER — DIPHENHYDRAMINE HCL 25 MG PO CAPS
25.0000 mg | ORAL_CAPSULE | Freq: Once | ORAL | Status: AC
  Administered 2021-05-19: 25 mg via ORAL

## 2021-05-19 MED ORDER — ACETAMINOPHEN 325 MG PO TABS
ORAL_TABLET | ORAL | Status: AC
  Filled 2021-05-19: qty 2

## 2021-05-19 MED ORDER — METHYLPREDNISOLONE SODIUM SUCC 125 MG IJ SOLR
125.0000 mg | Freq: Once | INTRAMUSCULAR | Status: AC
Start: 2021-05-19 — End: 2021-05-19
  Administered 2021-05-19: 125 mg via INTRAVENOUS

## 2021-05-19 MED ORDER — ACETAMINOPHEN 325 MG PO TABS
650.0000 mg | ORAL_TABLET | Freq: Once | ORAL | Status: AC
  Administered 2021-05-19: 650 mg via ORAL

## 2021-05-19 MED ORDER — METHYLPREDNISOLONE SODIUM SUCC 125 MG IJ SOLR
INTRAMUSCULAR | Status: AC
  Filled 2021-05-19: qty 2

## 2021-05-19 MED ORDER — PAROXETINE HCL 10 MG PO TABS: 10.0000 mg | ORAL_TABLET | Freq: Every day | ORAL | 5 refills | Status: DC

## 2021-05-19 NOTE — Telephone Encounter (Signed)
Patient has reached out again about mammogram. Message sent to provider. tbw

## 2021-05-19 NOTE — Patient Instructions (Signed)
[facility]  Discharge Instructions: Thank you for choosing [facility] to provide your oncology and hematology care.   If you have a lab appointment with the [facility], please go directly to the [facility] and check in at the registration area.   Wear comfortable clothing and clothing appropriate for easy access to any Portacath or PICC line.   We strive to give you quality time with your provider. You may need to reschedule your appointment if you arrive late (15 or more minutes).  Arriving late affects you and other patients whose appointments are after yours.  Also, if you miss three or more appointments without notifying the office, you may be dismissed from the clinic at the provider's discretion.      For prescription refill requests, have your pharmacy contact our office and allow 72 hours for refills to be completed.    Today you received the following chemotherapy and/or immunotherapy agents: mogamulizumab      To help prevent nausea and vomiting after your treatment, we encourage you to take your nausea medication as directed.  BELOW ARE SYMPTOMS THAT SHOULD BE REPORTED IMMEDIATELY: *FEVER GREATER THAN 100.4 F (38 C) OR HIGHER *CHILLS OR SWEATING *NAUSEA AND VOMITING THAT IS NOT CONTROLLED WITH YOUR NAUSEA MEDICATION *UNUSUAL SHORTNESS OF BREATH *UNUSUAL BRUISING OR BLEEDING *URINARY PROBLEMS (pain or burning when urinating, or frequent urination) *BOWEL PROBLEMS (unusual diarrhea, constipation, pain near the anus) TENDERNESS IN MOUTH AND THROAT WITH OR WITHOUT PRESENCE OF ULCERS (sore throat, sores in mouth, or a toothache) UNUSUAL RASH, SWELLING OR PAIN  UNUSUAL VAGINAL DISCHARGE OR ITCHING   Items with * indicate a potential emergency and should be followed up as soon as possible or go to the Emergency Department if any problems should occur.  Please show the CHEMOTHERAPY ALERT CARD or IMMUNOTHERAPY ALERT CARD at check-in  to the Emergency Department and triage nurse.  Should you have questions after your visit or need to cancel or reschedule your appointment, please contact [facility]  Dept: [phone]  and follow the prompts.  Office hours are 8:00 a.m. to 4:30 p.m. Monday - Friday. Please note that voicemails left after 4:00 p.m. may not be returned until the following business day.  We are closed weekends and major holidays. You have access to a nurse at all times for urgent questions. Please call the main number to the clinic Dept: [phone] and follow the prompts.   For any non-urgent questions, you may also contact your provider using MyChart. We now offer e-Visits for anyone 43 and older to request care online for non-urgent symptoms. For details visit [email or web address].   Also download the MyChart app! Go to the app store, search "MyChart", open the app, select [facility], and log in with your MyChart username and password.  Due to Covid, a mask is required upon entering the hospital/clinic. If you do not have a mask, one will be given to you upon arrival. For doctor visits, patients may have 1 support person aged 69 or older with them. For treatment visits, patients cannot have anyone with them due to current Covid guidelines and our immunocompromised population.

## 2021-05-19 NOTE — Telephone Encounter (Signed)
Called patient to let her know that the order has been put in and she should be able to call and get it scheduled. Patient advised to give [place] a call if she has any additional problems. tbw

## 2021-05-26 ENCOUNTER — Ambulatory Visit: Payer: 59 | Admitting: Student

## 2021-05-26 ENCOUNTER — Other Ambulatory Visit: Payer: Self-pay

## 2021-05-26 ENCOUNTER — Ambulatory Visit
Admission: RE | Admit: 2021-05-26 | Discharge: 2021-05-26 | Disposition: A | Discharge status: Home / Self Care | Payer: 59 | Source: Ambulatory Visit | Attending: Obstetrics & Gynecology | Admitting: Obstetrics & Gynecology

## 2021-05-26 DIAGNOSIS — Z1231 Encounter for screening mammogram for malignant neoplasm of breast: Secondary | ICD-10-CM

## 2021-05-28 ENCOUNTER — Inpatient Hospital Stay: Payer: Medicare Other | Admitting: General Practice

## 2021-05-30 ENCOUNTER — Encounter: Payer: Self-pay | Admitting: Hematology

## 2021-05-31 ENCOUNTER — Other Ambulatory Visit: Payer: Self-pay

## 2021-05-31 DIAGNOSIS — C8590 Non-Hodgkin lymphoma, unspecified, unspecified site: Secondary | ICD-10-CM

## 2021-06-01 NOTE — Progress Notes (Signed)
HEMATOLOGY/ONCOLOGY CLINIC NOTE  Date of Service: .06/02/2021   Patient Care Team: Alexa Noon, MD as PCP - General (Family Medicine) Alexa Berthold, PA-C (Cardiology)  CHIEF COMPLAINTS/PURPOSE OF CONSULTATION:  Mycosis fungoides/Sezary syndrome  HISTORY OF PRESENTING ILLNESS:   Alexa [name] is a wonderful 70 y.o. 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 The pt reports that she is doing well overall.   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 01/2016, she noticed a red patch on her left posterior calf which prompted presentation to her local dermatologist, Dr. Tonia [name] in [place].  B. 02/14/16 punch biopsy was done of a pink-brown plaque on the right anterior thigh that showed ALI and was suspicious for CTCL, but did not confirm the diagnosis of CTCL/MF. Clobetasol prescribed and was effective until 03/2017 and was started on MTX ('10mg'$  weekly) by Dr. Shaaron [name].  C. 05/25/20-09/14/20 6 cycles of brentuximab D. 11/09/20 C1D1 Mogamulizumab".   Also from [place], "2. Rectal carcinoma  A. 05/19/17 CT C/A/P was unremarkable and showed no evidence for metastatic disease B. 06/09/17  Patient had recent rectal polyp ectomy which revealed cancer within the specimen. Subsequent transanal excision   of the polypectomy site was performed 06/09/2017. Negative margins at the time of mucosal resection."  The pt reports that she is seeing Alexa [name] at [facility]. She believes the spike in T-cells is due to the vaccination. She has the CD30 receptor that Brentuximab targets. She started with the Brentuximab in July for 6 treatments and then Mogamulizumab. [name] all started with a skin rash on her lower body. She notes her last CT scan was around November of last year, and denies enlarged lymph nodes.  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 [name], in addition to neuropathy, is what influenced the switch to Mogamulizumab. [name] has not helped much, according to her, but has also had no side effects.   The pt notes that her skin involvement is as bad as it has been in a long time, and covers her entire body except the face and head regions. She goes for her fourth treatment tomorrow. She wishes to get infusions done at [facility] following the sixth cycle, but keep her doctors at [facility].  The pt notes that the itching goes away during night, but is bothersome during day since treatments has started. She claims [name] is very distracting.   The pt noted that she did not use the Benadryl with first treatments of Bentruximab, which caused her to lose her hair and experience harsh side effects.  The pt notes that she takes Zyrtec and Pepcid daily. She is active and works out many times a week. She goes to church, does pilates, cooks, daily walks, and notes no fatigue or weakness.  She desires for a small dose of steroids in her pre-treatment medications.   The pt notes that she started having heart palpitations in November 2021. She was given Metopolol to help with [name]. There was nothing on her two-week monitor that was of concern, she notes. She notes there are no major palpitations currently. [name]  all started with C6 Brentruximab.  The pt notes that she had a polyp removed in 2018 (Transanal Excision of Rectal Mass). She continues to follow up as needed with Dr. Marcello [name] for [name].   She claims Oxycodone makes her extremely nauseous and never wishes to take [name] again.  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 She note  [name] was a very stressful work environment.  On review of systems, pt reports skin rashes on all her body and irregular nightsweats except head/face, slight neuropathy, itching, tingling and denies n/v/d, chills, unexpected weight loss, fatigue, weakness, belly pain, abdominal pain, leg swelling and any other symptoms.  INTERVAL HISTORY  [name] is a wonderful 70 y.o. female who is here today for follow up regarding evaluation and management of mycosis fungoides/sezary syndome. The patient's last visit with [place] was on 04/07/2021. The pt reports that she is doing well overall. She is here for C8D1 Mogamulizumab.   The pt reports no acute new symptoms.  She notes that her flushing and erythema on the trunk after each dose of Mogamulizumab has improved and he she has stopped using the posttreatment prednisone.  She notes that she has had a scaly fixed rash on her face which is possible fungal infection for which she is using topical antifungal. She has a light box which she received form her dermatologist previously which she is using at home. She has been using probiotics to reduce risk of yeast infection. We discussed possibly using colloidal silver based hypoallergenic soaps.  No acute infection issues.  Energy levels have been okay.  No overt fevers chills night sweats.  lab results today 06/02/2021 of CBC w/diff and CMP are unremarkable. Sezary cell analysis on 05/24/2021 showed of 100 lymphoid cells 2 were Sezary like cells.  On review of systems, pt reports no other acute new symptoms.  She is grateful for all the help she is getting.    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 [name] @ [facility]   Family history of anesthesia complication    sister and sister's children postitive for MH!!!!!!, pt tested negative 3-68yr ago   GERD (gastroesophageal reflux disease)    History of  hypertension    off medication after weight loss   Hyperlipemia    [place]ymphoma ([facility] [place]    light box therapy   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[facility]    Osteopenia    bilateral hips   [place]al cancer [place] 04/2017    SURGICAL HISTORY: Past Surgical History:  Procedure Laterality Date   BREAST BIOPSY     COLONOSCOPY  03/07/2012   with biopsy   DILATATION & CURRETTAGE/HYSTEROSCOPY WITH RESECTOCOPE N/A 06/02/2014   Procedure: DILATATION & CURETTAGE/HYSTEROSCOPY WITH RESECTOCOPE;  Surgeon: MLyman Speller MD;  [facility];  Service: Gynecology;  Laterality: N/A;   FOOT SURGERY     Right    TRANSANAL EXCISION OF RECTAL MASS N/A 06/09/2017   Procedure: TRANSANAL EXCISION OF RECTAL MASS;  Surgeon: TLeighton Ruff MD;  [facility]  Service: General;  Laterality: N/A;    SOCIAL HISTORY: Social History   Socioeconomic History   Marital status: Single    Spouse name: Not on file   Number of children: 0   Years of education: Not on file   Highest education level:  Not on file  Occupational History   Not on file  Tobacco Use   Smoking status: Never   Smokeless tobacco: Never  Vaping Use   Vaping Use: Never used  Substance and Sexual Activity   Alcohol use: Yes    Alcohol/week: 0.0 - 1.0 standard drinks   Drug use: No   Sexual activity: Not Currently    Partners: Male    Birth control/protection: Post-menopausal  Other Topics Concern   Not on file  Social History Narrative   Not on file   Social Determinants of Health   Financial Resource Strain: Not on file  Food Insecurity: Not on file  Transportation Needs: Not on file  Physical Activity: Not on file  Stress: Not on file  Social Connections: Not on file  Intimate Partner Violence: Not on file    FAMILY HISTORY: Family History  Problem Relation Age of Onset   Anuerysm Father    Heart disease Father    Diabetes Father    Cancer Father        unknown type   Hypertension Father    Heart attack  Mother    Heart disease Mother    Kidney disease Mother    Kidney cancer Mother        contained tumor   Hypertension Mother    Heart attack Brother    Cancer Brother        liver cancer   Melanoma Brother    Other Brother        Bypass surgery    Hypertension Sister    Ovarian cancer Paternal Grandmother        was PMP   Colon cancer Neg Hx     ALLERGIES:  is allergic to ciprofloxacin, diphenhydramine hcl, and sudafed [pseudoephedrine hcl].  MEDICATIONS:  Current Outpatient Medications  Medication Sig Dispense Refill   amLODipine (NORVASC) 5 MG tablet Take 1 tablet (5 mg total) by mouth daily. 90 tablet 3   B Complex Vitamins (VITAMIN B COMPLEX) TABS Take by mouth. 5000 UT     Biotin 5000 MCG CAPS      cetirizine (ZYRTEC) 10 MG tablet Take 10 mg by mouth daily.     Cholecalciferol (VITAMIN D) 125 MCG (5000 UT) CAPS      Cod Liver Oil 1000 MG CAPS Take by mouth.     ELDERBERRY PO Take by mouth daily.     famotidine (PEPCID) 20 MG tablet Take 20 mg by mouth daily.     metoprolol tartrate (LOPRESSOR) 25 MG tablet Take 0.5 tablets (12.5 mg total) by mouth daily.     PARoxetine (PAXIL) 10 MG tablet Take 1 tablet (10 mg total) by mouth daily. 30 tablet 5   predniSONE (DELTASONE) 50 MG tablet Take 1 tab ('50mg'$ ) orally with breakfast for 5 days starting the morning after each Mogamulizumab treatment 30 tablet 0   Probiotic Product (PROBIOTIC DAILY PO) Take by mouth.     sodium chloride 0.9 % SOLN 250 mL with mogamulizumab-kpkc 20 MG/5ML SOLN 1 mg/kg Inject 1 mg/kg into the vein.     triamcinolone cream (KENALOG) 0.1 %      No current facility-administered medications for [name] visit.    REVIEW OF SYSTEMS:   10 Point review of Systems was done is negative except as noted above.  PHYSICAL EXAMINATION: ECOG PERFORMANCE STATUS: 1 - Symptomatic but completely ambulatory  VS stable, reivewed  No acute distress GENERAL:alert, in no acute distress and comfortable SKIN: Mild  erythroderma  over the chest . EYES: conjunctiva are pink and non-injected, sclera anicteric OROPHARYNX: MMM, no exudates, no oropharyngeal erythema or ulceration NECK: supple, no JVD LYMPH:  no palpable lymphadenopathy in the cervical, axillary or inguinal regions LUNGS: clear to auscultation b/l with normal respiratory effort HEART: regular rate & rhythm ABDOMEN:  normoactive bowel sounds , non tender, not distended. Extremity: no pedal edema PSYCH: alert & oriented x 3 with fluent speech NEURO: no focal motor/sensory deficits   LABORATORY DATA:  I have reviewed the data as listed  . CBC Latest Ref Rng & Units 06/02/2021 05/19/2021 05/05/2021  WBC 4.0 - 10.5 K/uL 3.9(L) 4.4 4.5  Hemoglobin 12.0 - 15.0 g/dL 14.2 13.9 14.6  Hematocrit 36.0 - 46.0 % 40.3 39.3 41.4  Platelets 150 - 400 K/uL 207 200 217    . CMP Latest Ref Rng & Units 06/02/2021 05/19/2021 05/05/2021  Glucose 70 - 99 mg/dL 94 84 90  BUN 8 - 23 mg/dL '20 22 19  '$ Creatinine 0.44 - 1.00 mg/dL 0.94 0.95 0.99  Sodium 135 - 145 mmol/L 139 141 140  Potassium 3.5 - 5.1 mmol/L 4.0 4.2 4.3  Chloride 98 - 111 mmol/L 106 106 105  CO2 22 - 32 mmol/L '25 26 25  '$ Calcium 8.9 - 10.3 mg/dL 9.4 9.4 9.4  Total Protein 6.5 - 8.1 g/dL 7.5 7.2 7.3  Total Bilirubin 0.3 - 1.2 mg/dL 0.9 0.8 1.0  Alkaline Phos 38 - 126 U/L 66 61 65  AST 15 - 41 U/L '20 20 20  '$ ALT 0 - 44 U/L '10 13 12   '$ 02/22/2021 Sezary Cell Analysis  9% Sezary-like cells present.   02/22/2021 Flow Cytometry  CD4+/CD7- cells are 21% of the total lymphoid gate. CD4+/CD26- cells are 22% of the total lymphoid gate. The CD4:CD8 ratio of the T-cells is 1.5:1. There is no evidence suggestive of T-cell clonality.  Sezary Cell Analysis Order: [id number] Component 05/24/2021  Sezary Cell  Of 100 lymphoid cells, 2 are counted as Sezary like cells.   Sezary Cells    Pathologist Signature  Lynnae Sandhoff, M.D.    RADIOGRAPHIC STUDIES: I have personally reviewed the radiological images  as listed and agreed with the findings in the report. MM 3D SCREEN BREAST BILATERAL  Result Date: 05/28/2021 CLINICAL DATA:  Screening. EXAM: DIGITAL SCREENING BILATERAL MAMMOGRAM WITH TOMOSYNTHESIS AND CAD TECHNIQUE: Bilateral screening digital craniocaudal and mediolateral oblique mammograms were obtained. Bilateral screening digital breast tomosynthesis was performed. The images were evaluated with computer-aided detection. COMPARISON:  Previous exam(s). ACR Breast Density Category c: The breast tissue is heterogeneously dense, which may obscure small masses. FINDINGS: There are no findings suspicious for malignancy. IMPRESSION: No mammographic evidence of malignancy. A result letter of [name] screening mammogram will be mailed directly to the patient. RECOMMENDATION: Screening mammogram in one year. (Code:SM-B-01Y) BI-RADS CATEGORY  1: Negative. Electronically Signed   By: Dorise Bullion III M.D   On: 05/28/2021 10:45     ASSESSMENT & PLAN:   70 yo with   1) CTCL with previous treatment as noted above. Mycosis fungoides with Sezary syndrome  PLAN: -Discussed pt labwork today, 06/02/2021; CBC and CMP unremarkable. -The pt has no prohibitive toxicities from continuing [place] at [name] time. Will continue to monitor. -Recent surgery cell analysis on 05/24/2021 at [facility] was improved.  Down from 9 out of 100 lymphoid cells to 2 out of 100 lymphoid cells. -She has stopped her post treatment 3 to 4 days of prednisone at [name] time. -Recommended  pt drink 48-64 oz water daily. -Continue Singulair once daily.  -Continue Pepcid and Zyrtec. Benadryl in addition if flare-up prn. -Patient has been using light box at home.  FOLLOW UP:  Please schedule next 2 cycles of mogamulizumab (4 doses) as per orders. Labs with each treatment MD visit win 6 weeks      All of the patients questions were answered with apparent satisfaction. The patient knows to call the clinic with any problems, questions  or concerns.    The total time spent in the appointment was 30 minutes and more than 50% was on counseling and direct patient cares, ordering and mx of mogamulizumab treatment and toxicity monitoring and mx    Sullivan Lone MD MS AAHIVMS [facility] Hematology/Oncology Physician [facility]  (Office):       [phone] (Work cell):  [phone] (Fax):           [phone]  I have reviewed the above documentation for accuracy and completeness, and I agree with the above.   06/01/2021 9:02 PM  I, Reinaldo Raddle, am acting as scribe for Dr. Sullivan Lone, MD. .I have reviewed the above documentation for accuracy and completeness, and I agree with the above. Brunetta Genera MD

## 2021-06-02 ENCOUNTER — Other Ambulatory Visit: Payer: Self-pay

## 2021-06-02 ENCOUNTER — Telehealth: Payer: Self-pay

## 2021-06-02 ENCOUNTER — Inpatient Hospital Stay: Payer: Medicare Other

## 2021-06-02 ENCOUNTER — Other Ambulatory Visit: Payer: Self-pay | Admitting: Student

## 2021-06-02 ENCOUNTER — Other Ambulatory Visit: Payer: Medicare Other

## 2021-06-02 ENCOUNTER — Inpatient Hospital Stay (HOSPITAL_BASED_OUTPATIENT_CLINIC_OR_DEPARTMENT_OTHER): Payer: Medicare Other | Admitting: Hematology

## 2021-06-02 VITALS — BP 134/74 | HR 60 | Temp 98.7°F | Resp 17 | Ht 62.0 in | Wt 137.2 lb

## 2021-06-02 DIAGNOSIS — Z136 Encounter for screening for cardiovascular disorders: Secondary | ICD-10-CM

## 2021-06-02 DIAGNOSIS — Z5111 Encounter for antineoplastic chemotherapy: Secondary | ICD-10-CM | POA: Diagnosis not present

## 2021-06-02 DIAGNOSIS — C8590 Non-Hodgkin lymphoma, unspecified, unspecified site: Secondary | ICD-10-CM

## 2021-06-02 DIAGNOSIS — C8408 Mycosis fungoides, lymph nodes of multiple sites: Secondary | ICD-10-CM

## 2021-06-02 DIAGNOSIS — Z5112 Encounter for antineoplastic immunotherapy: Secondary | ICD-10-CM | POA: Diagnosis not present

## 2021-06-02 DIAGNOSIS — Z7189 Other specified counseling: Secondary | ICD-10-CM

## 2021-06-02 LAB — CBC WITH DIFFERENTIAL (CANCER CENTER ONLY)
Abs Immature Granulocytes: 0.01 10*3/uL (ref 0.00–0.07)
Basophils Absolute: 0.1 10*3/uL (ref 0.0–0.1)
Basophils Relative: 2 %
Eosinophils Absolute: 0.4 10*3/uL (ref 0.0–0.5)
Eosinophils Relative: 10 %
HCT: 40.3 % (ref 36.0–46.0)
Hemoglobin: 14.2 g/dL (ref 12.0–15.0)
Immature Granulocytes: 0 %
Lymphocytes Relative: 22 %
Lymphs Abs: 0.9 10*3/uL (ref 0.7–4.0)
MCH: 33.4 pg (ref 26.0–34.0)
MCHC: 35.2 g/dL (ref 30.0–36.0)
MCV: 94.8 fL (ref 80.0–100.0)
Monocytes Absolute: 0.4 10*3/uL (ref 0.1–1.0)
Monocytes Relative: 10 %
Neutro Abs: 2.2 10*3/uL (ref 1.7–7.7)
Neutrophils Relative %: 56 %
Platelet Count: 207 10*3/uL (ref 150–400)
RBC: 4.25 MIL/uL (ref 3.87–5.11)
RDW: 11.8 % (ref 11.5–15.5)
WBC Count: 3.9 10*3/uL — ABNORMAL LOW (ref 4.0–10.5)
nRBC: 0 % (ref 0.0–0.2)

## 2021-06-02 LAB — CMP (CANCER CENTER ONLY)
ALT: 10 U/L (ref 0–44)
AST: 20 U/L (ref 15–41)
Albumin: 4 g/dL (ref 3.5–5.0)
Alkaline Phosphatase: 66 U/L (ref 38–126)
Anion gap: 8 (ref 5–15)
BUN: 20 mg/dL (ref 8–23)
CO2: 25 mmol/L (ref 22–32)
Calcium: 9.4 mg/dL (ref 8.9–10.3)
Chloride: 106 mmol/L (ref 98–111)
Creatinine: 0.94 mg/dL (ref 0.44–1.00)
GFR, Estimated: >60 mL/min (ref >60)
Glucose, Bld: 94 mg/dL (ref 70–99)
Potassium: 4 mmol/L (ref 3.5–5.1)
Sodium: 139 mmol/L (ref 135–145)
Total Bilirubin: 0.9 mg/dL (ref 0.3–1.2)
Total Protein: 7.5 g/dL (ref 6.5–8.1)

## 2021-06-02 LAB — LACTATE DEHYDROGENASE: LDH: 191 U/L (ref 98–192)

## 2021-06-02 MED ORDER — SODIUM CHLORIDE 0.9 % IV SOLN
1.0000 mg/kg | Freq: Once | INTRAVENOUS | Status: AC
  Administered 2021-06-02: 60 mg via INTRAVENOUS
  Filled 2021-06-02: qty 15

## 2021-06-02 MED ORDER — DIPHENHYDRAMINE HCL 25 MG PO CAPS
ORAL_CAPSULE | ORAL | Status: AC
  Filled 2021-06-02: qty 1

## 2021-06-02 MED ORDER — ACETAMINOPHEN 325 MG PO TABS
ORAL_TABLET | ORAL | Status: AC
  Filled 2021-06-02: qty 2

## 2021-06-02 MED ORDER — METHYLPREDNISOLONE SODIUM SUCC 125 MG IJ SOLR
80.0000 mg | Freq: Once | INTRAMUSCULAR | Status: AC
  Administered 2021-06-02: 80 mg via INTRAVENOUS

## 2021-06-02 MED ORDER — DIPHENHYDRAMINE HCL 25 MG PO CAPS
25.0000 mg | ORAL_CAPSULE | Freq: Once | ORAL | Status: AC
  Administered 2021-06-02: 25 mg via ORAL

## 2021-06-02 MED ORDER — METHYLPREDNISOLONE SODIUM SUCC 125 MG IJ SOLR
INTRAMUSCULAR | Status: AC
  Filled 2021-06-02: qty 2

## 2021-06-02 MED ORDER — SODIUM CHLORIDE 0.9 % IV SOLN
INTRAVENOUS | Status: DC
  Filled 2021-06-02: qty 250

## 2021-06-02 MED ORDER — ACETAMINOPHEN 325 MG PO TABS
650.0000 mg | ORAL_TABLET | Freq: Once | ORAL | Status: AC
  Administered 2021-06-02: 650 mg via ORAL

## 2021-06-02 NOTE — Telephone Encounter (Signed)
Order has been placed. Our office will work on scheduling.

## 2021-06-02 NOTE — Patient Instructions (Signed)
[facility] ONCOLOGY  Discharge Instructions: Thank you for choosing [facility] to provide your oncology and hematology care.   If you have a lab appointment with the [facility], please go directly to the [facility] and check in at the registration area.   Wear comfortable clothing and clothing appropriate for easy access to any Portacath or PICC line.   We strive to give you quality time with your provider. You may need to reschedule your appointment if you arrive late (15 or more minutes).  Arriving late affects you and other patients whose appointments are after yours.  Also, if you miss three or more appointments without notifying the office, you may be dismissed from the clinic at the provider's discretion.      For prescription refill requests, have your pharmacy contact our office and allow 72 hours for refills to be completed.    Today you received the following chemotherapy and/or immunotherapy agents mogomulizumab      To help prevent nausea and vomiting after your treatment, we encourage you to take your nausea medication as directed.  BELOW ARE SYMPTOMS THAT SHOULD BE REPORTED IMMEDIATELY: *FEVER GREATER THAN 100.4 F (38 C) OR HIGHER *CHILLS OR SWEATING *NAUSEA AND VOMITING THAT IS NOT CONTROLLED WITH YOUR NAUSEA MEDICATION *UNUSUAL SHORTNESS OF BREATH *UNUSUAL BRUISING OR BLEEDING *URINARY PROBLEMS (pain or burning when urinating, or frequent urination) *BOWEL PROBLEMS (unusual diarrhea, constipation, pain near the anus) TENDERNESS IN MOUTH AND THROAT WITH OR WITHOUT PRESENCE OF ULCERS (sore throat, sores in mouth, or a toothache) UNUSUAL RASH, SWELLING OR PAIN  UNUSUAL VAGINAL DISCHARGE OR ITCHING   Items with * indicate a potential emergency and should be followed up as soon as possible or go to the Emergency Department if any problems should occur.  Please show the CHEMOTHERAPY ALERT CARD or IMMUNOTHERAPY ALERT CARD at check-in  to the Emergency Department and triage nurse.  Should you have questions after your visit or need to cancel or reschedule your appointment, please contact [facility]  Dept: [phone]  and follow the prompts.  Office hours are 8:00 a.m. to 4:30 p.m. Monday - Friday. Please note that voicemails left after 4:00 p.m. may not be returned until the following business day.  We are closed weekends and major holidays. You have access to a nurse at all times for urgent questions. Please call the main number to the clinic Dept: [phone] and follow the prompts.   For any non-urgent questions, you may also contact your provider using MyChart. We now offer e-Visits for anyone 28 and older to request care online for non-urgent symptoms. For details visit [email or web address].   Also download the MyChart app! Go to the app store, search "MyChart", open the app, select [facility], and log in with your MyChart username and password.  Due to Covid, a mask is required upon entering the hospital/clinic. If you do not have a mask, one will be given to you upon arrival. For doctor visits, patients may have 1 support person aged 46 or older with them. For treatment visits, patients cannot have anyone with them due to current Covid guidelines and our immunocompromised population.

## 2021-06-02 NOTE — Telephone Encounter (Signed)
Pt called and stated that her PCP agreed with the coronary calcium scan. Pt would like to get it done late August.

## 2021-06-03 ENCOUNTER — Telehealth: Payer: Self-pay | Admitting: Hematology

## 2021-06-03 NOTE — Telephone Encounter (Signed)
The front will be calling the pt tomorrow and scheduling her appt.

## 2021-06-03 NOTE — Telephone Encounter (Signed)
Left message with follow-up appointments per 7/27 los. 

## 2021-06-08 ENCOUNTER — Encounter: Payer: Self-pay | Admitting: Hematology

## 2021-06-14 ENCOUNTER — Encounter: Payer: Self-pay | Admitting: Hematology

## 2021-06-14 ENCOUNTER — Other Ambulatory Visit: Payer: Self-pay | Admitting: Physician Assistant

## 2021-06-14 MED ORDER — FLUCONAZOLE 100 MG PO TABS: 100.0000 mg | ORAL_TABLET | Freq: Every day | ORAL | 0 refills | Status: DC

## 2021-06-14 NOTE — Progress Notes (Signed)
fl

## 2021-06-16 ENCOUNTER — Inpatient Hospital Stay: Payer: 59

## 2021-06-28 ENCOUNTER — Ambulatory Visit
Admission: RE | Admit: 2021-06-28 | Discharge: 2021-06-28 | Disposition: A | Discharge status: Home / Self Care | Payer: No Typology Code available for payment source | Source: Ambulatory Visit | Attending: Student | Admitting: Student

## 2021-06-28 ENCOUNTER — Encounter: Payer: Self-pay | Admitting: Hematology

## 2021-06-28 DIAGNOSIS — Z136 Encounter for screening for cardiovascular disorders: Secondary | ICD-10-CM

## 2021-06-29 NOTE — Progress Notes (Signed)
Called patient and reviewed results of coronary calcium score.  Discussed that in context with LDL of 136 and calcium score in the 63rd percentile would recommend initiation of lipid management medication.  Patient has refused statin therapy in the past.  Discussed with patient recommendation of starting Zetia, however she wishes to do research prior to initiating this therapy.  Patient will notify our office of her decision or if she has questions

## 2021-06-29 NOTE — Telephone Encounter (Signed)
error 

## 2021-06-30 ENCOUNTER — Other Ambulatory Visit: Payer: Self-pay

## 2021-06-30 ENCOUNTER — Inpatient Hospital Stay: Payer: Medicare Other

## 2021-06-30 ENCOUNTER — Inpatient Hospital Stay: Payer: Medicare Other | Attending: Hematology

## 2021-06-30 ENCOUNTER — Encounter: Payer: Self-pay | Admitting: Hematology

## 2021-06-30 VITALS — BP 130/81 | HR 69 | Temp 98.3°F | Resp 18 | Wt 137.3 lb

## 2021-06-30 DIAGNOSIS — Z7189 Other specified counseling: Secondary | ICD-10-CM

## 2021-06-30 DIAGNOSIS — Z5112 Encounter for antineoplastic immunotherapy: Secondary | ICD-10-CM | POA: Diagnosis not present

## 2021-06-30 DIAGNOSIS — C8408 Mycosis fungoides, lymph nodes of multiple sites: Secondary | ICD-10-CM

## 2021-06-30 DIAGNOSIS — C8590 Non-Hodgkin lymphoma, unspecified, unspecified site: Secondary | ICD-10-CM

## 2021-06-30 DIAGNOSIS — C9150 Adult T-cell lymphoma/leukemia (HTLV-1-associated) not having achieved remission: Secondary | ICD-10-CM | POA: Diagnosis not present

## 2021-06-30 LAB — CMP (CANCER CENTER ONLY)
ALT: 12 U/L (ref 0–44)
AST: 21 U/L (ref 15–41)
Albumin: 4 g/dL (ref 3.5–5.0)
Alkaline Phosphatase: 70 U/L (ref 38–126)
Anion gap: 11 (ref 5–15)
BUN: 18 mg/dL (ref 8–23)
CO2: 23 mmol/L (ref 22–32)
Calcium: 9.4 mg/dL (ref 8.9–10.3)
Chloride: 104 mmol/L (ref 98–111)
Creatinine: 1.03 mg/dL — ABNORMAL HIGH (ref 0.44–1.00)
GFR, Estimated: 59 mL/min — ABNORMAL LOW (ref >60)
Glucose, Bld: 114 mg/dL — ABNORMAL HIGH (ref 70–99)
Potassium: 4.2 mmol/L (ref 3.5–5.1)
Sodium: 138 mmol/L (ref 135–145)
Total Bilirubin: 0.9 mg/dL (ref 0.3–1.2)
Total Protein: 7.3 g/dL (ref 6.5–8.1)

## 2021-06-30 LAB — CBC WITH DIFFERENTIAL (CANCER CENTER ONLY)
Abs Immature Granulocytes: 0.01 10*3/uL (ref 0.00–0.07)
Basophils Absolute: 0 10*3/uL (ref 0.0–0.1)
Basophils Relative: 0 %
Eosinophils Absolute: 0.4 10*3/uL (ref 0.0–0.5)
Eosinophils Relative: 8 %
HCT: 38.9 % (ref 36.0–46.0)
Hemoglobin: 14 g/dL (ref 12.0–15.0)
Immature Granulocytes: 0 %
Lymphocytes Relative: 15 %
Lymphs Abs: 0.7 10*3/uL (ref 0.7–4.0)
MCH: 33.4 pg (ref 26.0–34.0)
MCHC: 36 g/dL (ref 30.0–36.0)
MCV: 92.8 fL (ref 80.0–100.0)
Monocytes Absolute: 0.4 10*3/uL (ref 0.1–1.0)
Monocytes Relative: 8 %
Neutro Abs: 3.2 10*3/uL (ref 1.7–7.7)
Neutrophils Relative %: 69 %
Platelet Count: 201 10*3/uL (ref 150–400)
RBC: 4.19 MIL/uL (ref 3.87–5.11)
RDW: 11.6 % (ref 11.5–15.5)
WBC Count: 4.7 10*3/uL (ref 4.0–10.5)
nRBC: 0 % (ref 0.0–0.2)

## 2021-06-30 LAB — LACTATE DEHYDROGENASE: LDH: 175 U/L (ref 98–192)

## 2021-06-30 MED ORDER — METHYLPREDNISOLONE SODIUM SUCC 125 MG IJ SOLR
80.0000 mg | Freq: Once | INTRAMUSCULAR | Status: AC
  Administered 2021-06-30: 80 mg via INTRAVENOUS
  Filled 2021-06-30: qty 2

## 2021-06-30 MED ORDER — ACETAMINOPHEN 325 MG PO TABS
650.0000 mg | ORAL_TABLET | Freq: Once | ORAL | Status: AC
  Administered 2021-06-30: 650 mg via ORAL
  Filled 2021-06-30: qty 2

## 2021-06-30 MED ORDER — SODIUM CHLORIDE 0.9 % IV SOLN
1.0000 mg/kg | Freq: Once | INTRAVENOUS | Status: AC
  Administered 2021-06-30: 60 mg via INTRAVENOUS
  Filled 2021-06-30: qty 15

## 2021-06-30 MED ORDER — SODIUM CHLORIDE 0.9 % IV SOLN: INTRAVENOUS | Status: DC

## 2021-06-30 MED ORDER — DIPHENHYDRAMINE HCL 25 MG PO CAPS
25.0000 mg | ORAL_CAPSULE | Freq: Once | ORAL | Status: AC
  Administered 2021-06-30: 25 mg via ORAL
  Filled 2021-06-30: qty 1

## 2021-06-30 NOTE — Patient Instructions (Signed)
[facility] ONCOLOGY  Discharge Instructions: Thank you for choosing [facility] to provide your oncology and hematology care.   If you have a lab appointment with the [facility], please go directly to the [facility] and check in at the registration area.   Wear comfortable clothing and clothing appropriate for easy access to any Portacath or PICC line.   We strive to give you quality time with your provider. You may need to reschedule your appointment if you arrive late (15 or more minutes).  Arriving late affects you and other patients whose appointments are after yours.  Also, if you miss three or more appointments without notifying the office, you may be dismissed from the clinic at the provider's discretion.      For prescription refill requests, have your pharmacy contact our office and allow 72 hours for refills to be completed.    Today you received the following chemotherapy and/or immunotherapy agents : [place]      To help prevent nausea and vomiting after your treatment, we encourage you to take your nausea medication as directed.  BELOW ARE SYMPTOMS THAT SHOULD BE REPORTED IMMEDIATELY: *FEVER GREATER THAN 100.4 F (38 C) OR HIGHER *CHILLS OR SWEATING *NAUSEA AND VOMITING THAT IS NOT CONTROLLED WITH YOUR NAUSEA MEDICATION *UNUSUAL SHORTNESS OF BREATH *UNUSUAL BRUISING OR BLEEDING *URINARY PROBLEMS (pain or burning when urinating, or frequent urination) *BOWEL PROBLEMS (unusual diarrhea, constipation, pain near the anus) TENDERNESS IN MOUTH AND THROAT WITH OR WITHOUT PRESENCE OF ULCERS (sore throat, sores in mouth, or a toothache) UNUSUAL RASH, SWELLING OR PAIN  UNUSUAL VAGINAL DISCHARGE OR ITCHING   Items with * indicate a potential emergency and should be followed up as soon as possible or go to the Emergency Department if any problems should occur.  Please show the CHEMOTHERAPY ALERT CARD or IMMUNOTHERAPY ALERT CARD at check-in to  the Emergency Department and triage nurse.  Should you have questions after your visit or need to cancel or reschedule your appointment, please contact [facility]  Dept: [phone]  and follow the prompts.  Office hours are 8:00 a.m. to 4:30 p.m. Monday - Friday. Please note that voicemails left after 4:00 p.m. may not be returned until the following business day.  We are closed weekends and major holidays. You have access to a nurse at all times for urgent questions. Please call the main number to the clinic Dept: [phone] and follow the prompts.   For any non-urgent questions, you may also contact your provider using MyChart. We now offer e-Visits for anyone 75 and older to request care online for non-urgent symptoms. For details visit [email or web address].   Also download the MyChart app! Go to the app store, search "MyChart", open the app, select [facility], and log in with your MyChart username and password.  Due to Covid, a mask is required upon entering the hospital/clinic. If you do not have a mask, one will be given to you upon arrival. For doctor visits, patients may have 1 support person aged 20 or older with them. For treatment visits, patients cannot have anyone with them due to current Covid guidelines and our immunocompromised population.

## 2021-07-02 ENCOUNTER — Other Ambulatory Visit: Payer: 59 | Admitting: *Deleted

## 2021-07-05 ENCOUNTER — Telehealth: Payer: Self-pay | Admitting: Student

## 2021-07-05 MED ORDER — EZETIMIBE 10 MG PO TABS: 10.0000 mg | ORAL_TABLET | Freq: Every day | ORAL | 3 refills | Status: DC

## 2021-07-05 NOTE — Telephone Encounter (Signed)
Patient willing to start Zetia, but wishes to only take 10 mg every other day. Will plan to repeat lipid profile testing in 6 weeks.

## 2021-07-13 ENCOUNTER — Other Ambulatory Visit: Payer: Self-pay

## 2021-07-13 DIAGNOSIS — C8590 Non-Hodgkin lymphoma, unspecified, unspecified site: Secondary | ICD-10-CM

## 2021-07-14 ENCOUNTER — Inpatient Hospital Stay (HOSPITAL_BASED_OUTPATIENT_CLINIC_OR_DEPARTMENT_OTHER): Payer: Medicare Other | Admitting: Hematology

## 2021-07-14 ENCOUNTER — Other Ambulatory Visit: Payer: Self-pay

## 2021-07-14 ENCOUNTER — Inpatient Hospital Stay: Payer: Medicare Other | Attending: Hematology

## 2021-07-14 ENCOUNTER — Inpatient Hospital Stay: Payer: Medicare Other

## 2021-07-14 VITALS — BP 137/87 | HR 76 | Temp 99.3°F | Resp 20 | Wt 139.2 lb

## 2021-07-14 DIAGNOSIS — C8408 Mycosis fungoides, lymph nodes of multiple sites: Secondary | ICD-10-CM

## 2021-07-14 DIAGNOSIS — C8409 Mycosis fungoides, extranodal and solid organ sites: Secondary | ICD-10-CM | POA: Insufficient documentation

## 2021-07-14 DIAGNOSIS — M85852 Other specified disorders of bone density and structure, left thigh: Secondary | ICD-10-CM | POA: Diagnosis not present

## 2021-07-14 DIAGNOSIS — Z8041 Family history of malignant neoplasm of ovary: Secondary | ICD-10-CM | POA: Insufficient documentation

## 2021-07-14 DIAGNOSIS — K219 Gastro-esophageal reflux disease without esophagitis: Secondary | ICD-10-CM | POA: Diagnosis not present

## 2021-07-14 DIAGNOSIS — Z5111 Encounter for antineoplastic chemotherapy: Secondary | ICD-10-CM

## 2021-07-14 DIAGNOSIS — Z7189 Other specified counseling: Secondary | ICD-10-CM

## 2021-07-14 DIAGNOSIS — Z800 Family history of malignant neoplasm of digestive organs: Secondary | ICD-10-CM | POA: Diagnosis not present

## 2021-07-14 DIAGNOSIS — C8590 Non-Hodgkin lymphoma, unspecified, unspecified site: Secondary | ICD-10-CM

## 2021-07-14 DIAGNOSIS — Z79899 Other long term (current) drug therapy: Secondary | ICD-10-CM | POA: Diagnosis not present

## 2021-07-14 DIAGNOSIS — M85851 Other specified disorders of bone density and structure, right thigh: Secondary | ICD-10-CM | POA: Insufficient documentation

## 2021-07-14 DIAGNOSIS — C8410 Sezary disease, unspecified site: Secondary | ICD-10-CM | POA: Diagnosis not present

## 2021-07-14 DIAGNOSIS — Z5112 Encounter for antineoplastic immunotherapy: Secondary | ICD-10-CM | POA: Insufficient documentation

## 2021-07-14 DIAGNOSIS — Z86010 Personal history of colonic polyps: Secondary | ICD-10-CM | POA: Diagnosis not present

## 2021-07-14 DIAGNOSIS — Z85048 Personal history of other malignant neoplasm of rectum, rectosigmoid junction, and anus: Secondary | ICD-10-CM | POA: Insufficient documentation

## 2021-07-14 DIAGNOSIS — M8580 Other specified disorders of bone density and structure, unspecified site: Secondary | ICD-10-CM | POA: Insufficient documentation

## 2021-07-14 DIAGNOSIS — Z809 Family history of malignant neoplasm, unspecified: Secondary | ICD-10-CM | POA: Diagnosis not present

## 2021-07-14 DIAGNOSIS — E785 Hyperlipidemia, unspecified: Secondary | ICD-10-CM | POA: Diagnosis not present

## 2021-07-14 DIAGNOSIS — Z8572 Personal history of non-Hodgkin lymphomas: Secondary | ICD-10-CM | POA: Diagnosis not present

## 2021-07-14 LAB — CMP (CANCER CENTER ONLY)
ALT: 10 U/L (ref 0–44)
AST: 19 U/L (ref 15–41)
Albumin: 4 g/dL (ref 3.5–5.0)
Alkaline Phosphatase: 71 U/L (ref 38–126)
Anion gap: 11 (ref 5–15)
BUN: 19 mg/dL (ref 8–23)
CO2: 23 mmol/L (ref 22–32)
Calcium: 9.6 mg/dL (ref 8.9–10.3)
Chloride: 106 mmol/L (ref 98–111)
Creatinine: 1.05 mg/dL — ABNORMAL HIGH (ref 0.44–1.00)
GFR, Estimated: 58 mL/min — ABNORMAL LOW (ref >60)
Glucose, Bld: 114 mg/dL — ABNORMAL HIGH (ref 70–99)
Potassium: 4.2 mmol/L (ref 3.5–5.1)
Sodium: 140 mmol/L (ref 135–145)
Total Bilirubin: 0.9 mg/dL (ref 0.3–1.2)
Total Protein: 7.3 g/dL (ref 6.5–8.1)

## 2021-07-14 LAB — CBC WITH DIFFERENTIAL (CANCER CENTER ONLY)
Abs Immature Granulocytes: 0.01 10*3/uL (ref 0.00–0.07)
Basophils Absolute: 0 10*3/uL (ref 0.0–0.1)
Basophils Relative: 1 %
Eosinophils Absolute: 0.2 10*3/uL (ref 0.0–0.5)
Eosinophils Relative: 4 %
HCT: 40.1 % (ref 36.0–46.0)
Hemoglobin: 14.3 g/dL (ref 12.0–15.0)
Immature Granulocytes: 0 %
Lymphocytes Relative: 17 %
Lymphs Abs: 0.7 10*3/uL (ref 0.7–4.0)
MCH: 33.4 pg (ref 26.0–34.0)
MCHC: 35.7 g/dL (ref 30.0–36.0)
MCV: 93.7 fL (ref 80.0–100.0)
Monocytes Absolute: 0.3 10*3/uL (ref 0.1–1.0)
Monocytes Relative: 8 %
Neutro Abs: 3 10*3/uL (ref 1.7–7.7)
Neutrophils Relative %: 70 %
Platelet Count: 209 10*3/uL (ref 150–400)
RBC: 4.28 MIL/uL (ref 3.87–5.11)
RDW: 11.8 % (ref 11.5–15.5)
WBC Count: 4.3 10*3/uL (ref 4.0–10.5)
nRBC: 0 % (ref 0.0–0.2)

## 2021-07-14 LAB — LACTATE DEHYDROGENASE: LDH: 174 U/L (ref 98–192)

## 2021-07-14 MED ORDER — METHYLPREDNISOLONE SODIUM SUCC 125 MG IJ SOLR
62.5000 mg | Freq: Once | INTRAMUSCULAR | Status: AC
  Administered 2021-07-14: 62.5 mg via INTRAVENOUS
  Filled 2021-07-14: qty 2

## 2021-07-14 MED ORDER — ACETAMINOPHEN 325 MG PO TABS
650.0000 mg | ORAL_TABLET | Freq: Once | ORAL | Status: AC
  Administered 2021-07-14: 650 mg via ORAL
  Filled 2021-07-14: qty 2

## 2021-07-14 MED ORDER — DIPHENHYDRAMINE HCL 25 MG PO CAPS
25.0000 mg | ORAL_CAPSULE | Freq: Once | ORAL | Status: AC
Start: 2021-07-14 — End: 2021-07-14
  Administered 2021-07-14: 25 mg via ORAL
  Filled 2021-07-14: qty 1

## 2021-07-14 MED ORDER — SODIUM CHLORIDE 0.9 % IV SOLN
1.0000 mg/kg | Freq: Once | INTRAVENOUS | Status: AC
  Administered 2021-07-14: 60 mg via INTRAVENOUS
  Filled 2021-07-14: qty 15

## 2021-07-14 MED ORDER — SODIUM CHLORIDE 0.9 % IV SOLN: INTRAVENOUS | Status: DC

## 2021-07-14 NOTE — Patient Instructions (Signed)
[facility] ONCOLOGY   Discharge Instructions: Thank you for choosing [facility] to provide your oncology and hematology care.   If you have a lab appointment with the [facility], please go directly to the [facility] and check in at the registration area.   Wear comfortable clothing and clothing appropriate for easy access to any Portacath or PICC line.   We strive to give you quality time with your provider. You may need to reschedule your appointment if you arrive late (15 or more minutes).  Arriving late affects you and other patients whose appointments are after yours.  Also, if you miss three or more appointments without notifying the office, you may be dismissed from the clinic at the provider's discretion.      For prescription refill requests, have your pharmacy contact our office and allow 72 hours for refills to be completed.    Today you received the following chemotherapy and/or immunotherapy agents: Mogamulizumab       To help prevent nausea and vomiting after your treatment, we encourage you to take your nausea medication as directed.  BELOW ARE SYMPTOMS THAT SHOULD BE REPORTED IMMEDIATELY: *FEVER GREATER THAN 100.4 F (38 C) OR HIGHER *CHILLS OR SWEATING *NAUSEA AND VOMITING THAT IS NOT CONTROLLED WITH YOUR NAUSEA MEDICATION *UNUSUAL SHORTNESS OF BREATH *UNUSUAL BRUISING OR BLEEDING *URINARY PROBLEMS (pain or burning when urinating, or frequent urination) *BOWEL PROBLEMS (unusual diarrhea, constipation, pain near the anus) TENDERNESS IN MOUTH AND THROAT WITH OR WITHOUT PRESENCE OF ULCERS (sore throat, sores in mouth, or a toothache) UNUSUAL RASH, SWELLING OR PAIN  UNUSUAL VAGINAL DISCHARGE OR ITCHING   Items with * indicate a potential emergency and should be followed up as soon as possible or go to the Emergency Department if any problems should occur.  Please show the CHEMOTHERAPY ALERT CARD or IMMUNOTHERAPY ALERT CARD at  check-in to the Emergency Department and triage nurse.  Should you have questions after your visit or need to cancel or reschedule your appointment, please contact [facility]  Dept: [phone]  and follow the prompts.  Office hours are 8:00 a.m. to 4:30 p.m. Monday - Friday. Please note that voicemails left after 4:00 p.m. may not be returned until the following business day.  We are closed weekends and major holidays. You have access to a nurse at all times for urgent questions. Please call the main number to the clinic Dept: [phone] and follow the prompts.   For any non-urgent questions, you may also contact your provider using MyChart. We now offer e-Visits for anyone 49 and older to request care online for non-urgent symptoms. For details visit [email or web address].   Also download the MyChart app! Go to the app store, search "MyChart", open the app, select [facility], and log in with your MyChart username and password.  Due to Covid, a mask is required upon entering the hospital/clinic. If you do not have a mask, one will be given to you upon arrival. For doctor visits, patients may have 1 support person aged 66 or older with them. For treatment visits, patients cannot have anyone with them due to current Covid guidelines and our immunocompromised population.

## 2021-07-15 ENCOUNTER — Ambulatory Visit: Payer: Medicare Other

## 2021-07-15 ENCOUNTER — Telehealth: Payer: Self-pay | Admitting: Hematology

## 2021-07-15 NOTE — Telephone Encounter (Signed)
Left message with follow-up appointments per 9/7 los. 

## 2021-07-20 ENCOUNTER — Encounter: Payer: Self-pay | Admitting: Hematology

## 2021-07-20 NOTE — Progress Notes (Signed)
HEMATOLOGY/ONCOLOGY CLINIC NOTE  Date of Service: .07/14/2021   Patient Care Team: Alexa Noon, MD as PCP - General (Family Medicine) Alexa Berthold, PA-C (Cardiology)  CHIEF COMPLAINTS/PURPOSE OF CONSULTATION:  Mycosis fungoides/Sezary syndrome  HISTORY OF PRESENTING ILLNESS:   Alexa [name] is a wonderful 70 y.o. 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 The pt reports that she is doing well overall.   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 01/2016, she noticed a red patch on her left posterior calf which prompted presentation to her local dermatologist, Dr. Tonia [name] in [place].  B. 02/14/16 punch biopsy was done of a pink-brown plaque on the right anterior thigh that showed ALI and was suspicious for CTCL, but did not confirm the diagnosis of CTCL/MF. Clobetasol prescribed and was effective until 03/2017 and was started on MTX ('10mg'$  weekly) by Dr. Shaaron [name].  C. 05/25/20-09/14/20 6 cycles of brentuximab D. 11/09/20 C1D1 Mogamulizumab".   Also from Alexa [name], "2. Rectal carcinoma  A. 05/19/17 CT C/A/P was unremarkable and showed no evidence for metastatic disease B. 06/09/17  Patient had recent rectal polyp ectomy which revealed cancer within the specimen. Subsequent transanal excision   of the polypectomy site was performed 06/09/2017. Negative margins at the time of mucosal resection."  The pt reports that she is seeing Dr. Lanier [name] at [facility]. She believes the spike in T-cells is due to the vaccination. She has the CD30 receptor that Brentuximab targets. She started with the Brentuximab in July for 6 treatments and then Mogamulizumab. This all started with a skin rash on her lower body. She notes her last CT scan was around November of last year, and denies enlarged lymph nodes.  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 This, in addition to neuropathy, is what influenced the switch to Mogamulizumab. This has not helped much, according to her, but has also had no side effects.   The pt notes that her skin involvement is as bad as it has been in a long time, and covers her entire body except the face and head regions. She goes for her fourth treatment tomorrow. She wishes to get infusions done at [facility] following the sixth cycle, but keep her doctors at [facility].  The pt notes that the itching goes away during night, but is bothersome during day since treatments has started. She claims this is very distracting.   The pt noted that she did not use the Benadryl with first treatments of Bentruximab, which caused her to lose her hair and experience harsh side effects.  The pt notes that she takes Zyrtec and Pepcid daily. She is active and works out many times a week. She goes to church, does pilates, cooks, daily walks, and notes no fatigue or weakness.  She desires for a small dose of steroids in her pre-treatment medications.   The pt notes that she started having heart palpitations in November 2021. She was given Metopolol to help with this. There was nothing on her two-week monitor that was of concern, she notes. She notes there are no major palpitations currently. This  all started with C6 Brentruximab.  The pt notes that she had a polyp removed in 2018 (Transanal Excision of Rectal Mass). She continues to follow up as needed with Dr. Marcello [name] for this.   She claims Oxycodone makes her extremely nauseous and never wishes to take this again.  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 She note  this was a very stressful work environment.  On review of systems, pt reports skin rashes on all her body and irregular nightsweats except head/face, slight neuropathy, itching, tingling and denies n/v/d, chills, unexpected weight loss, fatigue, weakness, belly pain, abdominal pain, leg swelling and any other symptoms.  INTERVAL HISTORY  Alexa [name] is a wonderful 70 y.o. female who is here today for follow up regarding evaluation and management of mycosis fungoides/sezary syndome. The patient's last visit with [place] was on 06/02/2021. The pt reports that she is doing well overall. She is here for C9D1 Mogamulizumab.   The pt reports no acute new symptoms.  She notes that she has followed up with her dermatologist who are helping her manage her fungal infections.   She is keen to reduce her steroid use as premedication.  She has stopped using her posttreatment prednisone at home without any significant increase in infusion related rashes. Overall feels her skin involvement for CTCL is relatively stable and that she is able to manage a decent quality of life.  No new lumps or bumps.  No fevers no chills no night sweats no unexpected weight loss. No other acute infectious issues.  lab results today 07/14/2021 of CBC w/diff and CMP are unremarkable.  LDH within normal limits  On review of systems, pt reports no other acute new symptoms.     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[facility]    light box therapy   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Alexa [name]    Osteopenia    bilateral hips   Rectal cancer (Alexa [name] 04/2017    SURGICAL  HISTORY: Past Surgical History:  Procedure Laterality Date   BREAST BIOPSY     COLONOSCOPY  03/07/2012   with biopsy   DILATATION & CURRETTAGE/HYSTEROSCOPY WITH RESECTOCOPE N/A 06/02/2014   Procedure: DILATATION & CURETTAGE/HYSTEROSCOPY WITH RESECTOCOPE;  Surgeon: MLyman Speller MD;  [facility];  Service: Gynecology;  Laterality: N/A;   FOOT SURGERY     Right    TRANSANAL EXCISION OF RECTAL MASS N/A 06/09/2017   Procedure: TRANSANAL EXCISION OF RECTAL MASS;  Surgeon: TLeighton Ruff MD;  [facility]  Service: General;  Laterality: N/A;    SOCIAL HISTORY: Social History   Socioeconomic History   Marital status: Single    Spouse name: Not on file   Number of children: 0   Years of education: Not on file   Highest education level: Not on file  Occupational History   Not on file  Tobacco Use   Smoking status: Never   Smokeless tobacco: Never  Vaping Use   Vaping Use: Never used  Substance  and Sexual Activity   Alcohol use: Yes    Alcohol/week: 0.0 - 1.0 standard drinks   Drug use: No   Sexual activity: Not Currently    Partners: Male    Birth control/protection: Post-menopausal  Other Topics Concern   Not on file  Social History Narrative   Not on file   Social Determinants of Health   Financial Resource Strain: Not on file  Food Insecurity: Not on file  Transportation Needs: Not on file  Physical Activity: Not on file  Stress: Not on file  Social Connections: Not on file  Intimate Partner Violence: Not on file    FAMILY HISTORY: Family History  Problem Relation Age of Onset   Anuerysm Father    Heart disease Father    Diabetes Father    Cancer Father        unknown type   Hypertension Father    Heart attack Mother    Heart disease Mother    Kidney disease Mother    Kidney cancer Mother        contained tumor   Hypertension Mother    Heart attack Brother    Cancer Brother        liver cancer   Melanoma Brother     Other Brother        Bypass surgery    Hypertension Sister    Ovarian cancer Paternal Grandmother        was PMP   Colon cancer Neg Hx     ALLERGIES:  is allergic to ciprofloxacin, diphenhydramine hcl, and sudafed [pseudoephedrine hcl].  MEDICATIONS:  Current Outpatient Medications  Medication Sig Dispense Refill   amLODipine (NORVASC) 5 MG tablet Take 1 tablet (5 mg total) by mouth daily. 90 tablet 3   B Complex Vitamins (VITAMIN B COMPLEX) TABS Take by mouth. 5000 UT     Biotin 5000 MCG CAPS      cetirizine (ZYRTEC) 10 MG tablet Take 10 mg by mouth daily.     Cholecalciferol (VITAMIN D) 125 MCG (5000 UT) CAPS      clotrimazole (LOTRIMIN) 1 % cream Apply topically 2 (two) times daily.     Cod Liver Oil 1000 MG CAPS Take by mouth.     ezetimibe (ZETIA) 10 MG tablet Take 1 tablet (10 mg total) by mouth daily. 30 tablet 3   famotidine (PEPCID) 20 MG tablet Take 20 mg by mouth daily.     metoprolol tartrate (LOPRESSOR) 25 MG tablet Take 0.5 tablets (12.5 mg total) by mouth daily.     predniSONE (DELTASONE) 50 MG tablet Take 1 tab ('50mg'$ ) orally with breakfast for 5 days starting the morning after each Mogamulizumab treatment 30 tablet 0   Probiotic Product (PROBIOTIC DAILY PO) Take by mouth.     sodium chloride 0.9 % SOLN 250 mL with mogamulizumab-kpkc 20 MG/5ML SOLN 1 mg/kg Inject 1 mg/kg into the vein.     tacrolimus (PROTOPIC) 0.1 % ointment AAA BID to face PRN     triamcinolone cream (KENALOG) 0.1 %      ELDERBERRY PO Take by mouth daily. (Patient not taking: Reported on 07/14/2021)     fluconazole (DIFLUCAN) 100 MG tablet Take 1 tablet (100 mg total) by mouth daily. 7 tablet 0   PARoxetine (PAXIL) 10 MG tablet Take 1 tablet (10 mg total) by mouth daily. (Patient not taking: Reported on 07/14/2021) 30 tablet 5   No current facility-administered medications for this visit.    REVIEW OF SYSTEMS:   .  10 Point review of Systems was done is negative except as noted above.   PHYSICAL  EXAMINATION: ECOG PERFORMANCE STATUS: 1 - Symptomatic but completely ambulatory  VS stable, reivewed . GENERAL:alert, in no acute distress and comfortable SKIN: no acute rashes, no significant lesions EYES: conjunctiva are pink and non-injected, sclera anicteric OROPHARYNX: MMM, no exudates, no oropharyngeal erythema or ulceration NECK: supple, no JVD LYMPH:  no palpable lymphadenopathy in the cervical, axillary or inguinal regions LUNGS: clear to auscultation b/l with normal respiratory effort HEART: regular rate & rhythm ABDOMEN:  normoactive bowel sounds , non tender, not distended. Extremity: no pedal edema PSYCH: alert & oriented x 3 with fluent speech NEURO: no focal motor/sensory deficits    LABORATORY DATA:  I have reviewed the data as listed  . CBC Latest Ref Rng & Units 07/14/2021 06/30/2021 06/02/2021  WBC 4.0 - 10.5 K/uL 4.3 4.7 3.9(L)  Hemoglobin 12.0 - 15.0 g/dL 14.3 14.0 14.2  Hematocrit 36.0 - 46.0 % 40.1 38.9 40.3  Platelets 150 - 400 K/uL 209 201 207    . CMP Latest Ref Rng & Units 07/14/2021 06/30/2021 06/02/2021  Glucose 70 - 99 mg/dL 114(H) 114(H) 94  BUN 8 - 23 mg/dL '19 18 20  '$ Creatinine 0.44 - 1.00 mg/dL 1.05(H) 1.03(H) 0.94  Sodium 135 - 145 mmol/L 140 138 139  Potassium 3.5 - 5.1 mmol/L 4.2 4.2 4.0  Chloride 98 - 111 mmol/L 106 104 106  CO2 22 - 32 mmol/L '23 23 25  '$ Calcium 8.9 - 10.3 mg/dL 9.6 9.4 9.4  Total Protein 6.5 - 8.1 g/dL 7.3 7.3 7.5  Total Bilirubin 0.3 - 1.2 mg/dL 0.9 0.9 0.9  Alkaline Phos 38 - 126 U/L 71 70 66  AST 15 - 41 U/L '19 21 20  '$ ALT 0 - 44 U/L '10 12 10   '$ 02/22/2021 Sezary Cell Analysis  9% Sezary-like cells present.   02/22/2021 Flow Cytometry  CD4+/CD7- cells are 21% of the total lymphoid gate. CD4+/CD26- cells are 22% of the total lymphoid gate. The CD4:CD8 ratio of the T-cells is 1.5:1. There is no evidence suggestive of T-cell clonality.  Sezary Cell Analysis Order: [id number] Component 05/24/2021  Sezary Cell  Of 100  lymphoid cells, 2 are counted as Sezary like cells.   Sezary Cells    Pathologist Signature  Lynnae Sandhoff, M.D.    RADIOGRAPHIC STUDIES: I have personally reviewed the radiological images as listed and agreed with the findings in the report. CT CARDIAC SCORING ([facility] LOCATIONS ONLY)  Result Date: 06/28/2021 CLINICAL DATA:  70 year old Caucasian female with history of hyperlipidemia, hypertension and family history of heart disease. EXAM: CT CARDIAC CORONARY ARTERY CALCIUM SCORE TECHNIQUE: Non-contrast imaging through the heart was performed using prospective ECG gating. Image post processing was performed on an independent workstation, allowing for quantitative analysis of the heart and coronary arteries. Note that this exam targets the heart and the chest was not imaged in its entirety. COMPARISON:  CT of the chest on 09/20/2019 FINDINGS: CORONARY CALCIUM SCORES: Left Main: 0 LAD: 34.3 LCx: 0 RCA: 0 Total Agatston Score: 34.3 MESA database percentile: 63 AORTA MEASUREMENTS: Ascending Aorta: 36 mm Descending Aorta: 24 mm OTHER FINDINGS: The heart size is within normal limits. No pericardial fluid is identified. Visualized segments of the thoracic aorta and central pulmonary arteries are normal in caliber. Visualized mediastinum and hilar regions demonstrate no lymphadenopathy or masses. Visualized lungs show no evidence of pulmonary edema, consolidation, pneumothorax, nodule or pleural fluid. Visualized upper abdomen and  bony structures are unremarkable. IMPRESSION: Coronary calcium score of 34.3 is at the 63rd percentile for the patient's age, sex and race. Electronically Signed   By: Aletta Edouard M.D.   On: 06/28/2021 12:08     ASSESSMENT & PLAN:   70 yo with   1) CTCL with previous treatment as noted above. Mycosis fungoides with Sezary syndrome  PLAN: -Discussed pt labwork today, 07/14/2021; CBC and CMP and LDH unremarkable. -The pt has no prohibitive toxicities from continuing [place]  at this time. Will continue to monitor. -She has stopped her post treatment 3 to 4 days of prednisone at this time. -Recommended pt drink 48-64 oz water daily. -Continue Singulair once daily.  -Continue Pepcid and Zyrtec. Benadryl in addition if flare-up prn. -Patient has been using light box at home. -will reduce pre treat solumedrol to 62.'5mg'$  and try to continue to taper this.  FOLLOW UP: Please schedule next 4 doses [2 cycles] of mogamulizumab treatment  Labs with each treatment MD visit in 4 weeks and 8 weeks.   All of the patients questions were answered with apparent satisfaction. The patient knows to call the clinic with any problems, questions or concerns.  . The total time spent in the appointment was 30 minutes and more than 50% was on counseling and direct patient cares., ordering and mx of chemotherapy     Sullivan Lone MD MS AAHIVMS [facility] Hematology/Oncology Physician [facility]  (Office):       [phone] (Work cell):  [phone] (Fax):           [phone]

## 2021-07-26 ENCOUNTER — Encounter: Payer: Self-pay | Admitting: Hematology

## 2021-07-28 ENCOUNTER — Other Ambulatory Visit: Payer: Self-pay

## 2021-07-28 ENCOUNTER — Encounter: Payer: Self-pay | Admitting: Hematology

## 2021-07-28 ENCOUNTER — Ambulatory Visit: Payer: No Typology Code available for payment source

## 2021-07-28 ENCOUNTER — Inpatient Hospital Stay: Payer: Medicare Other

## 2021-07-28 ENCOUNTER — Other Ambulatory Visit: Payer: Self-pay | Admitting: Hematology

## 2021-07-28 ENCOUNTER — Other Ambulatory Visit: Payer: No Typology Code available for payment source

## 2021-07-28 VITALS — BP 119/74 | HR 61 | Temp 98.4°F | Resp 18 | Wt 140.5 lb

## 2021-07-28 DIAGNOSIS — Z5112 Encounter for antineoplastic immunotherapy: Secondary | ICD-10-CM | POA: Diagnosis not present

## 2021-07-28 DIAGNOSIS — Z7189 Other specified counseling: Secondary | ICD-10-CM

## 2021-07-28 DIAGNOSIS — C8590 Non-Hodgkin lymphoma, unspecified, unspecified site: Secondary | ICD-10-CM

## 2021-07-28 DIAGNOSIS — C8408 Mycosis fungoides, lymph nodes of multiple sites: Secondary | ICD-10-CM

## 2021-07-28 LAB — CBC WITH DIFFERENTIAL (CANCER CENTER ONLY)
Abs Immature Granulocytes: 0.02 10*3/uL (ref 0.00–0.07)
Basophils Absolute: 0 10*3/uL (ref 0.0–0.1)
Basophils Relative: 1 %
Eosinophils Absolute: 0.1 10*3/uL (ref 0.0–0.5)
Eosinophils Relative: 3 %
HCT: 38.6 % (ref 36.0–46.0)
Hemoglobin: 14.1 g/dL (ref 12.0–15.0)
Immature Granulocytes: 0 %
Lymphocytes Relative: 15 %
Lymphs Abs: 0.7 10*3/uL (ref 0.7–4.0)
MCH: 33.8 pg (ref 26.0–34.0)
MCHC: 36.5 g/dL — ABNORMAL HIGH (ref 30.0–36.0)
MCV: 92.6 fL (ref 80.0–100.0)
Monocytes Absolute: 0.4 10*3/uL (ref 0.1–1.0)
Monocytes Relative: 9 %
Neutro Abs: 3.3 10*3/uL (ref 1.7–7.7)
Neutrophils Relative %: 72 %
Platelet Count: 206 10*3/uL (ref 150–400)
RBC: 4.17 MIL/uL (ref 3.87–5.11)
RDW: 11.8 % (ref 11.5–15.5)
WBC Count: 4.5 10*3/uL (ref 4.0–10.5)
nRBC: 0 % (ref 0.0–0.2)

## 2021-07-28 LAB — CMP (CANCER CENTER ONLY)
ALT: 12 U/L (ref 0–44)
AST: 18 U/L (ref 15–41)
Albumin: 4 g/dL (ref 3.5–5.0)
Alkaline Phosphatase: 72 U/L (ref 38–126)
Anion gap: 10 (ref 5–15)
BUN: 18 mg/dL (ref 8–23)
CO2: 22 mmol/L (ref 22–32)
Calcium: 9.4 mg/dL (ref 8.9–10.3)
Chloride: 108 mmol/L (ref 98–111)
Creatinine: 0.98 mg/dL (ref 0.44–1.00)
GFR, Estimated: >60 mL/min (ref >60)
Glucose, Bld: 117 mg/dL — ABNORMAL HIGH (ref 70–99)
Potassium: 4.1 mmol/L (ref 3.5–5.1)
Sodium: 140 mmol/L (ref 135–145)
Total Bilirubin: 0.7 mg/dL (ref 0.3–1.2)
Total Protein: 7.3 g/dL (ref 6.5–8.1)

## 2021-07-28 LAB — LACTATE DEHYDROGENASE: LDH: 200 U/L — ABNORMAL HIGH (ref 98–192)

## 2021-07-28 MED ORDER — METHYLPREDNISOLONE SODIUM SUCC 125 MG IJ SOLR: 62.5000 mg | Freq: Once | INTRAMUSCULAR | Status: DC

## 2021-07-28 MED ORDER — ACETAMINOPHEN 325 MG PO TABS
650.0000 mg | ORAL_TABLET | Freq: Once | ORAL | Status: AC
  Administered 2021-07-28: 650 mg via ORAL
  Filled 2021-07-28: qty 2

## 2021-07-28 MED ORDER — METHYLPREDNISOLONE SODIUM SUCC 40 MG IJ SOLR
INTRAMUSCULAR | Status: AC
  Filled 2021-07-28: qty 1

## 2021-07-28 MED ORDER — SODIUM CHLORIDE 0.9 % IV SOLN
1.0000 mg/kg | Freq: Once | INTRAVENOUS | Status: AC
  Administered 2021-07-28: 60 mg via INTRAVENOUS
  Filled 2021-07-28: qty 15

## 2021-07-28 MED ORDER — SODIUM CHLORIDE 0.9 % IV SOLN: INTRAVENOUS | Status: DC

## 2021-07-28 MED ORDER — DIPHENHYDRAMINE HCL 25 MG PO CAPS
25.0000 mg | ORAL_CAPSULE | Freq: Once | ORAL | Status: AC
  Administered 2021-07-28: 25 mg via ORAL
  Filled 2021-07-28: qty 1

## 2021-07-28 MED ORDER — METHYLPREDNISOLONE SODIUM SUCC 40 MG IJ SOLR
40.0000 mg | Freq: Once | INTRAMUSCULAR | Status: AC
  Administered 2021-07-28: 40 mg via INTRAVENOUS

## 2021-07-28 NOTE — Patient Instructions (Signed)
[facility] ONCOLOGY  Discharge Instructions: Thank you for choosing [facility] to provide your oncology and hematology care.   If you have a lab appointment with the [facility], please go directly to the [facility] and check in at the registration area.   Wear comfortable clothing and clothing appropriate for easy access to any Portacath or PICC line.   We strive to give you quality time with your provider. You may need to reschedule your appointment if you arrive late (15 or more minutes).  Arriving late affects you and other patients whose appointments are after yours.  Also, if you miss three or more appointments without notifying the office, you may be dismissed from the clinic at the provider's discretion.      For prescription refill requests, have your pharmacy contact our office and allow 72 hours for refills to be completed.    Today you received the following chemotherapy and/or immunotherapy agents [place]      To help prevent nausea and vomiting after your treatment, we encourage you to take your nausea medication as directed.  BELOW ARE SYMPTOMS THAT SHOULD BE REPORTED IMMEDIATELY: *FEVER GREATER THAN 100.4 F (38 C) OR HIGHER *CHILLS OR SWEATING *NAUSEA AND VOMITING THAT IS NOT CONTROLLED WITH YOUR NAUSEA MEDICATION *UNUSUAL SHORTNESS OF BREATH *UNUSUAL BRUISING OR BLEEDING *URINARY PROBLEMS (pain or burning when urinating, or frequent urination) *BOWEL PROBLEMS (unusual diarrhea, constipation, pain near the anus) TENDERNESS IN MOUTH AND THROAT WITH OR WITHOUT PRESENCE OF ULCERS (sore throat, sores in mouth, or a toothache) UNUSUAL RASH, SWELLING OR PAIN  UNUSUAL VAGINAL DISCHARGE OR ITCHING   Items with * indicate a potential emergency and should be followed up as soon as possible or go to the Emergency Department if any problems should occur.  Please show the CHEMOTHERAPY ALERT CARD or IMMUNOTHERAPY ALERT CARD at check-in to  the Emergency Department and triage nurse.  Should you have questions after your visit or need to cancel or reschedule your appointment, please contact [facility]  Dept: [phone]  and follow the prompts.  Office hours are 8:00 a.m. to 4:30 p.m. Monday - Friday. Please note that voicemails left after 4:00 p.m. may not be returned until the following business day.  We are closed weekends and major holidays. You have access to a nurse at all times for urgent questions. Please call the main number to the clinic Dept: [phone] and follow the prompts.   For any non-urgent questions, you may also contact your provider using MyChart. We now offer e-Visits for anyone 68 and older to request care online for non-urgent symptoms. For details visit [email or web address].   Also download the MyChart app! Go to the app store, search "MyChart", open the app, select [facility], and log in with your MyChart username and password.  Due to Covid, a mask is required upon entering the hospital/clinic. If you do not have a mask, one will be given to you upon arrival. For doctor visits, patients may have 1 support person aged 68 or older with them. For treatment visits, patients cannot have anyone with them due to current Covid guidelines and our immunocompromised population.

## 2021-07-30 ENCOUNTER — Other Ambulatory Visit: Payer: No Typology Code available for payment source | Admitting: General Practice

## 2021-08-02 ENCOUNTER — Telehealth: Payer: Self-pay

## 2021-08-02 NOTE — Telephone Encounter (Signed)
Pt wanted to let you know that she has been feeling her heart beat skipping this morning. Pt took 12.5 mg of metoprolol this morning around 10:30 am and will be taking the other dose around 4 pm. Pt said you can message her back on Mychart if needed.

## 2021-08-02 NOTE — Telephone Encounter (Signed)
Noted, palpitations were well controlled with Lopressor at last visit.

## 2021-08-06 ENCOUNTER — Ambulatory Visit (HOSPITAL_COMMUNITY)
Admission: EM | Admit: 2021-08-06 | Discharge: 2021-08-06 | Disposition: A | Discharge status: Home / Self Care | Payer: Medicare Other | Attending: Emergency Medicine | Admitting: Emergency Medicine

## 2021-08-06 ENCOUNTER — Other Ambulatory Visit: Payer: Self-pay

## 2021-08-06 ENCOUNTER — Encounter (HOSPITAL_COMMUNITY): Payer: Self-pay | Admitting: Emergency Medicine

## 2021-08-06 DIAGNOSIS — N390 Urinary tract infection, site not specified: Secondary | ICD-10-CM | POA: Diagnosis not present

## 2021-08-06 LAB — POCT URINALYSIS DIPSTICK, ED / UC
Bilirubin Urine: NEGATIVE
Glucose, UA: NEGATIVE mg/dL
Ketones, ur: NEGATIVE mg/dL
Nitrite: NEGATIVE
Protein, ur: NEGATIVE mg/dL
Specific Gravity, Urine: 1.02 (ref 1.005–1.030)
Urobilinogen, UA: 0.2 mg/dL (ref 0.0–1.0)
pH: 5.5 (ref 5.0–8.0)

## 2021-08-06 MED ORDER — SULFAMETHOXAZOLE-TRIMETHOPRIM 800-160 MG PO TABS: 1.0000 | ORAL_TABLET | Freq: Two times a day (BID) | ORAL | 0 refills | Status: AC

## 2021-08-06 NOTE — ED Provider Notes (Signed)
[place]    CSN: [id number] Arrival date & time: 08/06/21  0805      History   Chief Complaint Chief Complaint  Patient presents with   Abdominal Pain    HPI Alexa [name] is a 70 y.o. female.   Patient here for evaluation of lower abdominal pain that has been ongoing for the past 3 to 4 days.  Reports pain started initially in the left lower quadrant but is now more central.  Denies any dysuria, urgency, or frequency.  Denies any lower back or flank pain.  Denies any diarrhea or constipation.  Does report history of cancer and is undergoing treatment.  Reports having a low-grade fever of 99.8 last night for which she did take Tylenol.  Temperature 98.3 in office.  Denies any trauma, injury, or other precipitating event.  Denies any specific alleviating or aggravating factors.  Denies any chest pain, shortness of breath, N/V/D, numbness, tingling, weakness, or headaches.    The history is provided by the patient.  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[place])    Osteopenia    bilateral hips   Rectal cancer ([place]) 04/2017    Patient Active Problem List   Diagnosis Date Noted   Muscle strain of gluteal region, right, sequela 04/08/2021   Counseling regarding advance care planning and goals of care 12/04/2020   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   Situational anxiety 08/13/2019   Hypertension, essential 02/06/2018   Other hyperlipidemia 02/06/2018    Colorectal cancer, stage I ([place]) 09/19/2017   Laryngopharyngeal reflux (LPR) 12/23/2016   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[place]) 08/11/2016   Sciatic nerve pain 07/28/2016   Achilles tendinitis of right lower extremity 06/10/2015   Dysphagia 05/09/2012   Palpitations 09/26/2011    Past Surgical History:  Procedure Laterality Date   BREAST BIOPSY     COLONOSCOPY  03/07/2012   with biopsy   DILATATION & CURRETTAGE/HYSTEROSCOPY WITH RESECTOCOPE N/A 06/02/2014   Procedure: [place];  Surgeon: Lyman Speller, MD;  Location: [facility];  Service: Gynecology;  Laterality: N/A;   FOOT SURGERY     Right    TRANSANAL EXCISION OF RECTAL MASS N/A 06/09/2017   Procedure: TRANSANAL EXCISION OF RECTAL MASS;  Surgeon: [name], MD;  Location: [place];  Service: General;  Laterality: N/A;    OB History     Gravida  1   Para  0   Term      Preterm      AB      Living  0      SAB      IAB      Ectopic      Multiple      Live Births               Home Medications    Prior to Admission medications   Medication Sig Start Date  End Date Taking? Authorizing Provider  sulfamethoxazole-trimethoprim (BACTRIM DS) 800-160 MG tablet Take 1 tablet by mouth 2 (two) times daily for 7 days. 08/06/21 08/13/21 Yes Pearson Forster, NP  amLODipine (NORVASC) 5 MG tablet Take 1 tablet (5 mg total) by mouth daily. 11/05/20 07/14/21  Cantwell, Celeste C, PA-C  B Complex Vitamins (VITAMIN B COMPLEX) TABS Take by mouth. [place]    [provider]  Biotin 5000 MCG CAPS  07/08/20   [provider]  cetirizine (ZYRTEC) 10 MG tablet Take 10 mg by mouth daily.    [provider]  Cholecalciferol (VITAMIN D) 125 MCG (5000 UT) CAPS     [provider]  clotrimazole (LOTRIMIN) 1 % cream Apply topically 2 (two) times daily. 07/07/21 07/07/22  [provider]  Cod Liver Oil 1000 MG CAPS Take by mouth.     [provider]  ELDERBERRY PO Take by mouth daily. Patient not taking: Reported on 07/14/2021    [provider]  ezetimibe (ZETIA) 10 MG tablet Take 1 tablet (10 mg total) by mouth daily. 07/05/21 11/02/21  Cantwell, Celeste C, PA-C  famotidine (PEPCID) 20 MG tablet Take 20 mg by mouth daily.    [provider]  fluconazole (DIFLUCAN) 100 MG tablet Take 1 tablet (100 mg total) by mouth daily. 06/14/21   Dede Query T, PA-C  metoprolol tartrate (LOPRESSOR) 25 MG tablet Take 0.5 tablets (12.5 mg total) by mouth daily. 11/26/20 07/14/21  Cantwell, Celeste C, PA-C  PARoxetine (PAXIL) 10 MG tablet Take 1 tablet (10 mg total) by mouth daily. Patient not taking: Reported on 07/14/2021 05/19/21   [name], MD  predniSONE (DELTASONE) 50 MG tablet Take 1 tab (50mg ) orally with breakfast for 5 days starting the morning after each Mogamulizumab treatment 01/13/21   Brunetta Genera, MD  Probiotic Product (PROBIOTIC DAILY PO) Take by mouth.    [provider]  sodium chloride 0.9 % SOLN 250 mL with mogamulizumab-kpkc 20 MG/5ML SOLN 1 mg/kg Inject 1 mg/kg into the vein.    [provider]  tacrolimus (PROTOPIC) 0.1 % ointment AAA BID to face PRN 07/07/21   [provider]  triamcinolone cream (KENALOG) 0.1 %  05/08/20   [provider]    Family History Family History  Problem Relation Age of Onset   Anuerysm Father    Heart disease Father    Diabetes Father    Cancer Father        unknown type   Hypertension Father    Heart attack Mother    Heart disease Mother    Kidney disease Mother    Kidney cancer Mother        contained tumor   Hypertension Mother    Heart attack Brother    Cancer Brother        liver cancer   Melanoma Brother    Other Brother        Bypass surgery    Hypertension Sister    Ovarian cancer Paternal Grandmother        was PMP   Colon cancer Neg Hx     Social History Social History   Tobacco Use    Smoking status: Never   Smokeless tobacco: Never  Vaping Use   Vaping Use: Never used  Substance Use Topics   Alcohol use: Yes    Alcohol/week: 0.0 - 1.0 standard drinks   Drug use: No     Allergies   Ciprofloxacin, Diphenhydramine hcl,  and Sudafed [pseudoephedrine hcl]   Review of Systems Review of Systems  Constitutional:  Positive for fever.  Gastrointestinal:  Positive for abdominal pain. Negative for diarrhea, nausea and vomiting.  Genitourinary:  Negative for flank pain, frequency and urgency.  All other systems reviewed and are negative.   Physical Exam Triage Vital Signs ED Triage Vitals  Enc Vitals Group     BP 08/06/21 0819 137/79     Pulse Rate 08/06/21 0819 64     Resp 08/06/21 0819 17     Temp 08/06/21 0819 98.3 F (36.8 C)     Temp Source 08/06/21 0819 Oral     SpO2 08/06/21 0819 95 %     Weight --      Height --      Head Circumference --      Peak Flow --      Pain Score 08/06/21 0823 3     Pain Loc --      Pain Edu? --      Excl. in [place]? --    No data found.  Updated Vital Signs BP 137/79 (BP Location: Right Arm)   Pulse 64   Temp 98.3 F (36.8 C) (Oral)   Resp 17   LMP 11/07/2002   SpO2 95%   Visual Acuity Right Eye Distance:   Left Eye Distance:   Bilateral Distance:    Right Eye Near:   Left Eye Near:    Bilateral Near:     Physical Exam Vitals and nursing note reviewed.  Constitutional:      General: She is not in acute distress.    Appearance: Normal appearance. She is not ill-appearing, toxic-appearing or diaphoretic.  HENT:     Head: Normocephalic and atraumatic.  Eyes:     Extraocular Movements: Extraocular movements intact.     Conjunctiva/sclera: Conjunctivae normal.     Pupils: Pupils are equal, round, and reactive to light.  Cardiovascular:     Rate and Rhythm: Normal rate and regular rhythm.     Pulses: Normal pulses.     Heart sounds: Normal heart sounds.  Pulmonary:     Effort: Pulmonary effort is normal.      Breath sounds: Normal breath sounds.  Abdominal:     General: Abdomen is flat. Bowel sounds are normal.     Palpations: Abdomen is soft.     Tenderness: There is abdominal tenderness in the suprapubic area. There is no right CVA tenderness or left CVA tenderness. Negative signs include psoas sign and obturator sign.  Musculoskeletal:        General: Normal range of motion.     Cervical back: Normal range of motion.  Skin:    General: Skin is warm and dry.  Neurological:     General: No focal deficit present.     Mental Status: She is alert and oriented to person, place, and time.  Psychiatric:        Mood and Affect: Mood normal.     UC Treatments / Results  Labs (all labs ordered are listed, but only abnormal results are displayed) Labs Reviewed  POCT URINALYSIS DIPSTICK, ED / UC - Abnormal; Notable for the following components:      Result Value   Hgb urine dipstick SMALL (*)    Leukocytes,Ua SMALL (*)    All other components within normal limits  URINE CULTURE    EKG   Radiology No results found.  Procedures Procedures (including critical care time)  Medications Ordered  in UC Medications - No data to display  Initial Impression / Assessment and Plan / UC Course  I have reviewed the triage vital signs and the nursing notes.  Pertinent labs & imaging results that were available during my care of the patient were reviewed by me and considered in my medical decision making (see chart for details).    Assessment negative for red flags or concerns.  UTI likely based on urinalysis with hemoglobin and leukocytes.  Urine culture pending.  We will treat with Bactrim twice daily for 7 days.  Encourage fluids especially water.  May take AZO, cranberry pills or Pyridium for symptom management.  Follow-up with PCP for reevaluation as soon as possible. Final Clinical Impressions(s) / UC Diagnoses   Final diagnoses:  Acute lower UTI     Discharge Instructions      Take  the Bactrim twice a day for the next 7 days.    You can take Tylenol and/or Ibuprofen as needed for pain relief and fever reduction.   Make sure you are drinking plenty of fluids, especially water.  You can drink cranberry juice to help with symptom relief, but make sure it is cranberry juice and not cranberry cocktail.  You can also try AZO, cranberry pills, or pyridium as needed.    Eat yogurt or take a probiotic to help with symptoms.   Return or go to the Emergency Department if symptoms worsen or do not improve in the next few days.      ED Prescriptions     Medication Sig Dispense Auth. Provider   sulfamethoxazole-trimethoprim (BACTRIM DS) 800-160 MG tablet Take 1 tablet by mouth 2 (two) times daily for 7 days. 14 tablet Pearson Forster, NP      PDMP not reviewed this encounter.   Pearson Forster, NP 08/06/21 [phone]

## 2021-08-06 NOTE — Discharge Instructions (Signed)
Take the Bactrim twice a day for the next 7 days.    You can take Tylenol and/or Ibuprofen as needed for pain relief and fever reduction.   Make sure you are drinking plenty of fluids, especially water.  You can drink cranberry juice to help with symptom relief, but make sure it is cranberry juice and not cranberry cocktail.  You can also try AZO, cranberry pills, or pyridium as needed.    Eat yogurt or take a probiotic to help with symptoms.   Return or go to the Emergency Department if symptoms worsen or do not improve in the next few days.

## 2021-08-06 NOTE — ED Triage Notes (Signed)
Pt presents with lower abdominal pain xs 3-4 days.

## 2021-08-11 ENCOUNTER — Other Ambulatory Visit: Payer: Self-pay

## 2021-08-11 ENCOUNTER — Inpatient Hospital Stay: Payer: Medicare Other

## 2021-08-11 ENCOUNTER — Inpatient Hospital Stay (HOSPITAL_BASED_OUTPATIENT_CLINIC_OR_DEPARTMENT_OTHER): Payer: Medicare Other | Admitting: Hematology

## 2021-08-11 ENCOUNTER — Inpatient Hospital Stay: Payer: Medicare Other | Attending: Hematology

## 2021-08-11 VITALS — BP 134/62 | HR 69 | Temp 97.9°F | Resp 16 | Ht 62.0 in | Wt 136.6 lb

## 2021-08-11 DIAGNOSIS — C8410 Sezary disease, unspecified site: Secondary | ICD-10-CM | POA: Diagnosis not present

## 2021-08-11 DIAGNOSIS — Z5112 Encounter for antineoplastic immunotherapy: Secondary | ICD-10-CM | POA: Insufficient documentation

## 2021-08-11 DIAGNOSIS — C8590 Non-Hodgkin lymphoma, unspecified, unspecified site: Secondary | ICD-10-CM

## 2021-08-11 DIAGNOSIS — M8580 Other specified disorders of bone density and structure, unspecified site: Secondary | ICD-10-CM | POA: Diagnosis not present

## 2021-08-11 DIAGNOSIS — E785 Hyperlipidemia, unspecified: Secondary | ICD-10-CM | POA: Insufficient documentation

## 2021-08-11 DIAGNOSIS — C8408 Mycosis fungoides, lymph nodes of multiple sites: Secondary | ICD-10-CM | POA: Diagnosis not present

## 2021-08-11 DIAGNOSIS — Z809 Family history of malignant neoplasm, unspecified: Secondary | ICD-10-CM | POA: Diagnosis not present

## 2021-08-11 DIAGNOSIS — Z85048 Personal history of other malignant neoplasm of rectum, rectosigmoid junction, and anus: Secondary | ICD-10-CM | POA: Insufficient documentation

## 2021-08-11 DIAGNOSIS — Z86010 Personal history of colonic polyps: Secondary | ICD-10-CM | POA: Insufficient documentation

## 2021-08-11 DIAGNOSIS — Z5111 Encounter for antineoplastic chemotherapy: Secondary | ICD-10-CM

## 2021-08-11 DIAGNOSIS — K219 Gastro-esophageal reflux disease without esophagitis: Secondary | ICD-10-CM | POA: Diagnosis not present

## 2021-08-11 DIAGNOSIS — Z7189 Other specified counseling: Secondary | ICD-10-CM

## 2021-08-11 LAB — CMP (CANCER CENTER ONLY)
ALT: 42 U/L (ref 0–44)
AST: 29 U/L (ref 15–41)
Albumin: 4.1 g/dL (ref 3.5–5.0)
Alkaline Phosphatase: 108 U/L (ref 38–126)
Anion gap: 10 (ref 5–15)
BUN: 20 mg/dL (ref 8–23)
CO2: 22 mmol/L (ref 22–32)
Calcium: 9.7 mg/dL (ref 8.9–10.3)
Chloride: 106 mmol/L (ref 98–111)
Creatinine: 1.44 mg/dL — ABNORMAL HIGH (ref 0.44–1.00)
GFR, Estimated: 40 mL/min — ABNORMAL LOW (ref >60)
Glucose, Bld: 99 mg/dL (ref 70–99)
Potassium: 4.5 mmol/L (ref 3.5–5.1)
Sodium: 138 mmol/L (ref 135–145)
Total Bilirubin: 0.7 mg/dL (ref 0.3–1.2)
Total Protein: 7.7 g/dL (ref 6.5–8.1)

## 2021-08-11 LAB — CBC WITH DIFFERENTIAL (CANCER CENTER ONLY)
Abs Immature Granulocytes: 0.01 10*3/uL (ref 0.00–0.07)
Basophils Absolute: 0.1 10*3/uL (ref 0.0–0.1)
Basophils Relative: 1 %
Eosinophils Absolute: 0.1 10*3/uL (ref 0.0–0.5)
Eosinophils Relative: 2 %
HCT: 39.8 % (ref 36.0–46.0)
Hemoglobin: 14 g/dL (ref 12.0–15.0)
Immature Granulocytes: 0 %
Lymphocytes Relative: 19 %
Lymphs Abs: 0.8 10*3/uL (ref 0.7–4.0)
MCH: 33.1 pg (ref 26.0–34.0)
MCHC: 35.2 g/dL (ref 30.0–36.0)
MCV: 94.1 fL (ref 80.0–100.0)
Monocytes Absolute: 0.4 10*3/uL (ref 0.1–1.0)
Monocytes Relative: 9 %
Neutro Abs: 2.8 10*3/uL (ref 1.7–7.7)
Neutrophils Relative %: 69 %
Platelet Count: 272 10*3/uL (ref 150–400)
RBC: 4.23 MIL/uL (ref 3.87–5.11)
RDW: 11.5 % (ref 11.5–15.5)
WBC Count: 4.2 10*3/uL (ref 4.0–10.5)
nRBC: 0 % (ref 0.0–0.2)

## 2021-08-11 LAB — LACTATE DEHYDROGENASE: LDH: 163 U/L (ref 98–192)

## 2021-08-11 MED ORDER — SODIUM CHLORIDE 0.9 % IV SOLN
1.0000 mg/kg | Freq: Once | INTRAVENOUS | Status: AC
  Administered 2021-08-11: 60 mg via INTRAVENOUS
  Filled 2021-08-11: qty 15

## 2021-08-11 MED ORDER — SODIUM CHLORIDE 0.9 % IV SOLN: Freq: Once | INTRAVENOUS | Status: AC

## 2021-08-11 MED ORDER — DIPHENHYDRAMINE HCL 25 MG PO CAPS
25.0000 mg | ORAL_CAPSULE | Freq: Once | ORAL | Status: AC
  Administered 2021-08-11: 25 mg via ORAL
  Filled 2021-08-11: qty 1

## 2021-08-11 MED ORDER — METHYLPREDNISOLONE SODIUM SUCC 40 MG IJ SOLR
40.0000 mg | Freq: Once | INTRAMUSCULAR | Status: AC
  Administered 2021-08-11: 40 mg via INTRAVENOUS
  Filled 2021-08-11: qty 1

## 2021-08-11 MED ORDER — ACETAMINOPHEN 325 MG PO TABS
650.0000 mg | ORAL_TABLET | Freq: Once | ORAL | Status: AC
Start: 2021-08-11 — End: 2021-08-11
  Administered 2021-08-11: 650 mg via ORAL
  Filled 2021-08-11: qty 2

## 2021-08-11 NOTE — Patient Instructions (Signed)
[facility] ONCOLOGY   Discharge Instructions: Thank you for choosing [facility] to provide your oncology and hematology care.   If you have a lab appointment with the [facility], please go directly to the [facility] and check in at the registration area.   Wear comfortable clothing and clothing appropriate for easy access to any Portacath or PICC line.   We strive to give you quality time with your provider. You may need to reschedule your appointment if you arrive late (15 or more minutes).  Arriving late affects you and other patients whose appointments are after yours.  Also, if you miss three or more appointments without notifying the office, you may be dismissed from the clinic at the provider's discretion.      For prescription refill requests, have your pharmacy contact our office and allow 72 hours for refills to be completed.    Today you received the following chemotherapy and/or immunotherapy agents: Mogamulizumab       To help prevent nausea and vomiting after your treatment, we encourage you to take your nausea medication as directed.  BELOW ARE SYMPTOMS THAT SHOULD BE REPORTED IMMEDIATELY: *FEVER GREATER THAN 100.4 F (38 C) OR HIGHER *CHILLS OR SWEATING *NAUSEA AND VOMITING THAT IS NOT CONTROLLED WITH YOUR NAUSEA MEDICATION *UNUSUAL SHORTNESS OF BREATH *UNUSUAL BRUISING OR BLEEDING *URINARY PROBLEMS (pain or burning when urinating, or frequent urination) *BOWEL PROBLEMS (unusual diarrhea, constipation, pain near the anus) TENDERNESS IN MOUTH AND THROAT WITH OR WITHOUT PRESENCE OF ULCERS (sore throat, sores in mouth, or a toothache) UNUSUAL RASH, SWELLING OR PAIN  UNUSUAL VAGINAL DISCHARGE OR ITCHING   Items with * indicate a potential emergency and should be followed up as soon as possible or go to the Emergency Department if any problems should occur.  Please show the CHEMOTHERAPY ALERT CARD or IMMUNOTHERAPY ALERT CARD at  check-in to the Emergency Department and triage nurse.  Should you have questions after your visit or need to cancel or reschedule your appointment, please contact [facility]  Dept: [phone]  and follow the prompts.  Office hours are 8:00 a.m. to 4:30 p.m. Monday - Friday. Please note that voicemails left after 4:00 p.m. may not be returned until the following business day.  We are closed weekends and major holidays. You have access to a nurse at all times for urgent questions. Please call the main number to the clinic Dept: [phone] and follow the prompts.   For any non-urgent questions, you may also contact your provider using MyChart. We now offer e-Visits for anyone 49 and older to request care online for non-urgent symptoms. For details visit [email or web address].   Also download the MyChart app! Go to the app store, search "MyChart", open the app, select [facility], and log in with your MyChart username and password.  Due to Covid, a mask is required upon entering the hospital/clinic. If you do not have a mask, one will be given to you upon arrival. For doctor visits, patients may have 1 support person aged 66 or older with them. For treatment visits, patients cannot have anyone with them due to current Covid guidelines and our immunocompromised population.

## 2021-08-12 ENCOUNTER — Telehealth: Payer: Self-pay | Admitting: Hematology

## 2021-08-12 NOTE — Telephone Encounter (Signed)
Scheduled follow-up appointments per 10/5 los. Patient is aware. 

## 2021-08-17 ENCOUNTER — Encounter: Payer: Self-pay | Admitting: Hematology

## 2021-08-17 NOTE — Progress Notes (Signed)
HEMATOLOGY/ONCOLOGY CLINIC NOTE  Date of Service: .08/11/2021   Patient Care Team: Alexa Noon, MD as PCP - General (Family Medicine) Alexa Berthold, PA-C (Cardiology)  CHIEF COMPLAINTS/PURPOSE OF CONSULTATION:  Mycosis fungoides/Sezary syndrome  HISTORY OF PRESENTING ILLNESS:   Alexa [name] is a wonderful 70 y.o. female who has been referred to [place] by Alexa Burney Gauze, MD for evaluation and management of mycosis fungoides, unspecified body region. The pt reports that she is doing well overall.   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 01/2016, she noticed a red patch on her left posterior calf which prompted presentation to her local dermatologist, Dr. Tonia [name] in [place].  B. 02/14/16 punch biopsy was done of a pink-brown plaque on the right anterior thigh that showed ALI and was suspicious for CTCL, but did not confirm the diagnosis of CTCL/MF. Clobetasol prescribed and was effective until 03/2017 and was started on MTX (10mg  weekly) by Dr. Shaaron [name].  C. 05/25/20-09/14/20 6 cycles of brentuximab D. 11/09/20 C1D1 Mogamulizumab".   Also from Alexa [name], "2. Rectal carcinoma  A. 05/19/17 CT C/A/P was unremarkable and showed no evidence for metastatic disease B. 06/09/17  Patient had recent rectal polyp ectomy which revealed cancer within the specimen. Subsequent transanal excision   of the polypectomy site was performed 06/09/2017. Negative margins at the time of mucosal resection."  The pt reports that she is seeing Dr. Lanier [name] at [facility]. She believes the spike in T-cells is due to the vaccination. She has the CD30 receptor that Brentuximab targets. She started with the Brentuximab in July for 6 treatments and then Mogamulizumab. This all started with a skin rash on her lower body. She notes her last CT scan was around November of last year, and denies enlarged lymph nodes.  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 This, in addition to neuropathy, is what influenced the switch to Mogamulizumab. This has not helped much, according to her, but has also had no side effects.   The pt notes that her skin involvement is as bad as it has been in a long time, and covers her entire body except the face and head regions. She goes for her fourth treatment tomorrow. She wishes to get infusions done at [facility] following the sixth cycle, but keep her doctors at [facility].  The pt notes that the itching goes away during night, but is bothersome during day since treatments has started. She claims this is very distracting.   The pt noted that she did not use the Benadryl with first treatments of Bentruximab, which caused her to lose her hair and experience harsh side effects.  The pt notes that she takes Zyrtec and Pepcid daily. She is active and works out many times a week. She goes to church, does pilates, cooks, daily walks, and notes no fatigue or weakness.  She desires for a small dose of steroids in her pre-treatment medications.   The pt notes that she started having heart palpitations in November 2021. She was given Metopolol to help with this. There was nothing on her two-week monitor that was of concern, she notes. She notes there are no major palpitations currently. This  all started with C6 Brentruximab.  The pt notes that she had a polyp removed in 2018 (Transanal Excision of Rectal Mass). She continues to follow up as needed with Dr. Marcello [name] for this.   She claims Oxycodone makes her extremely nauseous and never wishes to take this again.  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 She note  this was a very stressful work environment.  On review of systems, pt reports skin rashes on all her body and irregular nightsweats except head/face, slight neuropathy, itching, tingling and denies n/v/d, chills, unexpected weight loss, fatigue, weakness, belly pain, abdominal pain, leg swelling and any other symptoms.  INTERVAL HISTORY  Alexa [name] is a wonderful 70 y.o. female who is here today for follow up regarding evaluation and management of mycosis fungoides/sezary syndome. The patient's last visit with [place] was on 08/03/2021. The pt reports that she is doing well overall. She contnes to be tolerant of the Mogamulizumab.   The pt reports no acute new symptoms.  Patient notes no acute new symptoms.  No new rashes.  No new drug eruptions no fevers no chills no night sweats no shortness of breath no swallowing issues.  On review of systems, pt reports no other acute new symptoms.     MEDICAL HISTORY:  Past Medical History:  Diagnosis Date   Adenomatous polyp    Cancer ([place])    cutaneous T-cell lymphoma; followed by Dr. 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[place])    Osteopenia    bilateral hips   Rectal cancer ([place]) 04/2017    SURGICAL HISTORY: Past Surgical History:  Procedure Laterality Date   BREAST BIOPSY     COLONOSCOPY  03/07/2012   with biopsy   DILATATION & CURRETTAGE/HYSTEROSCOPY WITH RESECTOCOPE N/A 06/02/2014   Procedure: DILATATION & CURETTAGE/HYSTEROSCOPY WITH RESECTOCOPE;  Surgeon: Lyman Speller, MD;  Location: [facility];  Service: Gynecology;  Laterality: N/A;   FOOT SURGERY     Right    TRANSANAL EXCISION OF RECTAL MASS N/A 06/09/2017   Procedure: TRANSANAL  EXCISION OF RECTAL MASS;  Surgeon: [name], MD;  Location: [place];  Service: General;  Laterality: N/A;    SOCIAL HISTORY: Social History   Socioeconomic History   Marital status: Single    Spouse name: Not on file   Number of children: 0   Years of education: Not on file   Highest education level: Not on file  Occupational History   Not on file  Tobacco Use   Smoking status: Never   Smokeless tobacco: Never  Vaping Use   Vaping Use: Never used  Substance and Sexual Activity   Alcohol use: Yes    Alcohol/week: 0.0 - 1.0 standard drinks   Drug use: No   Sexual activity: Not Currently    Partners: Male    Birth control/protection: Post-menopausal  Other Topics Concern   Not on file  Social History Narrative   Not on file   Social Determinants of Health   Financial Resource Strain: Not on file  Food Insecurity: Not on file  Transportation  Needs: Not on file  Physical Activity: Not on file  Stress: Not on file  Social Connections: Not on file  Intimate Partner Violence: Not on file    FAMILY HISTORY: Family History  Problem Relation Age of Onset   Anuerysm Father    Heart disease Father    Diabetes Father    Cancer Father        unknown type   Hypertension Father    Heart attack Mother    Heart disease Mother    Kidney disease Mother    Kidney cancer Mother        contained tumor   Hypertension Mother    Heart attack Brother    Cancer Brother        liver cancer   Melanoma Brother    Other Brother        Bypass surgery    Hypertension Sister    Ovarian cancer Paternal Grandmother        was PMP   Colon cancer Neg Hx     ALLERGIES:  is allergic to ciprofloxacin, diphenhydramine hcl, and sudafed [pseudoephedrine hcl].  MEDICATIONS:  Current Outpatient Medications  Medication Sig Dispense Refill   amLODipine (NORVASC) 5 MG tablet Take 1 tablet (5 mg total) by mouth daily. 90 tablet 3   B Complex Vitamins (VITAMIN B  COMPLEX) TABS Take by mouth. 5000 UT     Biotin 5000 MCG CAPS      cetirizine (ZYRTEC) 10 MG tablet Take 10 mg by mouth daily.     Cholecalciferol (VITAMIN D) 125 MCG (5000 UT) CAPS      clotrimazole (LOTRIMIN) 1 % cream Apply topically 2 (two) times daily.     Cod Liver Oil 1000 MG CAPS Take by mouth.     ELDERBERRY PO Take by mouth daily. (Patient not taking: Reported on 07/14/2021)     ezetimibe (ZETIA) 10 MG tablet Take 1 tablet (10 mg total) by mouth daily. 30 tablet 3   famotidine (PEPCID) 20 MG tablet Take 20 mg by mouth daily.     fluconazole (DIFLUCAN) 100 MG tablet Take 1 tablet (100 mg total) by mouth daily. 7 tablet 0   metoprolol tartrate (LOPRESSOR) 25 MG tablet Take 0.5 tablets (12.5 mg total) by mouth daily.     PARoxetine (PAXIL) 10 MG tablet Take 1 tablet (10 mg total) by mouth daily. (Patient not taking: Reported on 07/14/2021) 30 tablet 5   predniSONE (DELTASONE) 50 MG tablet Take 1 tab (50mg ) orally with breakfast for 5 days starting the morning after each Mogamulizumab treatment 30 tablet 0   Probiotic Product (PROBIOTIC DAILY PO) Take by mouth.     sodium chloride 0.9 % SOLN 250 mL with mogamulizumab-kpkc 20 MG/5ML SOLN 1 mg/kg Inject 1 mg/kg into the vein.     tacrolimus (PROTOPIC) 0.1 % ointment AAA BID to face PRN     triamcinolone cream (KENALOG) 0.1 %      No current facility-administered medications for this visit.    REVIEW OF SYSTEMS:   .10 Point review of Systems was done is negative except as noted above.   PHYSICAL EXAMINATION: ECOG PERFORMANCE STATUS: 1 - Symptomatic but completely ambulatory .BP 134/62 (BP Location: Left Arm, Patient Position: Sitting)   Pulse 69   Temp 97.9 F (36.6 C) (Oral)   Resp 16   Ht 5\' 2"  (1.575 m)   Wt 136 lb 9.6 oz (62 kg)   LMP 11/07/2002   SpO2 98%   BMI 24.98  kg/m   GENERAL:alert, in no acute distress and comfortable SKIN: no acute rashes, no significant lesions EYES: conjunctiva are pink and non-injected, sclera  anicteric OROPHARYNX: MMM, no exudates, no oropharyngeal erythema or ulceration NECK: supple, no JVD LYMPH:  no palpable lymphadenopathy in the cervical, axillary or inguinal regions LUNGS: clear to auscultation b/l with normal respiratory effort HEART: regular rate & rhythm ABDOMEN:  normoactive bowel sounds , non tender, not distended. Extremity: no pedal edema PSYCH: alert & oriented x 3 with fluent speech NEURO: no focal motor/sensory deficits     LABORATORY DATA:  I have reviewed the data as listed  . CBC Latest Ref Rng & Units 08/11/2021 07/28/2021 07/14/2021  WBC 4.0 - 10.5 K/uL 4.2 4.5 4.3  Hemoglobin 12.0 - 15.0 g/dL 14.0 14.1 14.3  Hematocrit 36.0 - 46.0 % 39.8 38.6 40.1  Platelets 150 - 400 K/uL 272 206 209    . CMP Latest Ref Rng & Units 08/11/2021 07/28/2021 07/14/2021  Glucose 70 - 99 mg/dL 99 117(H) 114(H)  BUN 8 - 23 mg/dL 20 18 19   Creatinine 0.44 - 1.00 mg/dL 1.44(H) 0.98 1.05(H)  Sodium 135 - 145 mmol/L 138 140 140  Potassium 3.5 - 5.1 mmol/L 4.5 4.1 4.2  Chloride 98 - 111 mmol/L 106 108 106  CO2 22 - 32 mmol/L 22 22 23   Calcium 8.9 - 10.3 mg/dL 9.7 9.4 9.6  Total Protein 6.5 - 8.1 g/dL 7.7 7.3 7.3  Total Bilirubin 0.3 - 1.2 mg/dL 0.7 0.7 0.9  Alkaline Phos 38 - 126 U/L 108 72 71  AST 15 - 41 U/L 29 18 19   ALT 0 - 44 U/L 42 12 10   02/22/2021 Sezary Cell Analysis  9% Sezary-like cells present.   02/22/2021 Flow Cytometry  CD4+/CD7- cells are 21% of the total lymphoid gate. CD4+/CD26- cells are 22% of the total lymphoid gate. The CD4:CD8 ratio of the T-cells is 1.5:1. There is no evidence suggestive of T-cell clonality.  Sezary Cell Analysis Order: [id number] Component 05/24/2021  Sezary Cell  Of 100 lymphoid cells, 2 are counted as Sezary like cells.   Sezary Cells    Pathologist Signature  Lynnae Sandhoff, M.D.    RADIOGRAPHIC STUDIES: I have personally reviewed the radiological images as listed and agreed with the findings in the report. No results  found.   ASSESSMENT & PLAN:   70 yo with   1) CTCL with previous treatment as noted above. Mycosis fungoides with Sezary syndrome  PLAN: -Discussed pt labwork today, 08/03/2021; CBC and CMP and LDH unremarkable. -The pt has no prohibitive toxicities from continuing [place] at this time. Will continue to monitor. -Continue Singulair once daily.  -Continue Pepcid and Zyrtec. Benadryl in addition if flare-up prn. -Patient has been using light box at home. -will reduce pre treat solumedrol to 62.5mg  and try to continue to taper this.  FOLLOW UP: Please schedule next 4 doses [2 cycles] of mogamulizumab treatment  Labs with each treatment MD visit in 4 weeks and 8 weeks.   All of the patients questions were answered with apparent satisfaction. The patient knows to call the clinic with any problems, questions or concerns.  . The total time spent in the appointment was 23 minutes and more than 50% was on counseling and direct patient cares.    Sullivan Lone MD [place] AAHIVMS [facility] Hematology/Oncology Physician [facility]

## 2021-08-25 ENCOUNTER — Inpatient Hospital Stay: Payer: Medicare Other

## 2021-08-25 ENCOUNTER — Other Ambulatory Visit: Payer: No Typology Code available for payment source

## 2021-08-25 ENCOUNTER — Other Ambulatory Visit: Payer: Self-pay

## 2021-08-25 ENCOUNTER — Ambulatory Visit: Payer: No Typology Code available for payment source

## 2021-08-25 VITALS — BP 130/85 | HR 70 | Temp 98.0°F | Resp 16 | Wt 138.8 lb

## 2021-08-25 DIAGNOSIS — C8590 Non-Hodgkin lymphoma, unspecified, unspecified site: Secondary | ICD-10-CM

## 2021-08-25 DIAGNOSIS — C8408 Mycosis fungoides, lymph nodes of multiple sites: Secondary | ICD-10-CM

## 2021-08-25 DIAGNOSIS — Z7189 Other specified counseling: Secondary | ICD-10-CM

## 2021-08-25 DIAGNOSIS — Z5112 Encounter for antineoplastic immunotherapy: Secondary | ICD-10-CM | POA: Diagnosis not present

## 2021-08-25 LAB — CMP (CANCER CENTER ONLY)
ALT: 11 U/L (ref 0–44)
AST: 19 U/L (ref 15–41)
Albumin: 4 g/dL (ref 3.5–5.0)
Alkaline Phosphatase: 79 U/L (ref 38–126)
Anion gap: 10 (ref 5–15)
BUN: 19 mg/dL (ref 8–23)
CO2: 23 mmol/L (ref 22–32)
Calcium: 9.4 mg/dL (ref 8.9–10.3)
Chloride: 107 mmol/L (ref 98–111)
Creatinine: 0.93 mg/dL (ref 0.44–1.00)
GFR, Estimated: >60 mL/min (ref >60)
Glucose, Bld: 99 mg/dL (ref 70–99)
Potassium: 4.2 mmol/L (ref 3.5–5.1)
Sodium: 140 mmol/L (ref 135–145)
Total Bilirubin: 0.8 mg/dL (ref 0.3–1.2)
Total Protein: 7.4 g/dL (ref 6.5–8.1)

## 2021-08-25 LAB — CBC WITH DIFFERENTIAL (CANCER CENTER ONLY)
Abs Immature Granulocytes: 0.01 10*3/uL (ref 0.00–0.07)
Basophils Absolute: 0 10*3/uL (ref 0.0–0.1)
Basophils Relative: 1 %
Eosinophils Absolute: 0.4 10*3/uL (ref 0.0–0.5)
Eosinophils Relative: 7 %
HCT: 39.1 % (ref 36.0–46.0)
Hemoglobin: 14 g/dL (ref 12.0–15.0)
Immature Granulocytes: 0 %
Lymphocytes Relative: 11 %
Lymphs Abs: 0.6 10*3/uL — ABNORMAL LOW (ref 0.7–4.0)
MCH: 32.9 pg (ref 26.0–34.0)
MCHC: 35.8 g/dL (ref 30.0–36.0)
MCV: 92 fL (ref 80.0–100.0)
Monocytes Absolute: 0.4 10*3/uL (ref 0.1–1.0)
Monocytes Relative: 7 %
Neutro Abs: 4 10*3/uL (ref 1.7–7.7)
Neutrophils Relative %: 74 %
Platelet Count: 215 10*3/uL (ref 150–400)
RBC: 4.25 MIL/uL (ref 3.87–5.11)
RDW: 11.7 % (ref 11.5–15.5)
WBC Count: 5.4 10*3/uL (ref 4.0–10.5)
nRBC: 0 % (ref 0.0–0.2)

## 2021-08-25 LAB — LACTATE DEHYDROGENASE: LDH: 187 U/L (ref 98–192)

## 2021-08-25 MED ORDER — SODIUM CHLORIDE 0.9 % IV SOLN
1.0000 mg/kg | Freq: Once | INTRAVENOUS | Status: AC
  Administered 2021-08-25: 60 mg via INTRAVENOUS
  Filled 2021-08-25: qty 15

## 2021-08-25 MED ORDER — METHYLPREDNISOLONE SODIUM SUCC 40 MG IJ SOLR
40.0000 mg | Freq: Once | INTRAMUSCULAR | Status: AC
  Administered 2021-08-25: 40 mg via INTRAVENOUS
  Filled 2021-08-25: qty 1

## 2021-08-25 MED ORDER — SODIUM CHLORIDE 0.9 % IV SOLN: INTRAVENOUS | Status: DC

## 2021-08-25 MED ORDER — ACETAMINOPHEN 325 MG PO TABS
650.0000 mg | ORAL_TABLET | Freq: Once | ORAL | Status: AC
  Administered 2021-08-25: 650 mg via ORAL
  Filled 2021-08-25: qty 2

## 2021-08-25 MED ORDER — DIPHENHYDRAMINE HCL 25 MG PO CAPS
25.0000 mg | ORAL_CAPSULE | Freq: Once | ORAL | Status: AC
  Administered 2021-08-25: 25 mg via ORAL
  Filled 2021-08-25: qty 1

## 2021-08-25 NOTE — Patient Instructions (Signed)
[facility]   Discharge Instructions: Thank you for choosing [facility] to provide your oncology and hematology care.   If you have a lab appointment with the [facility], please go directly to the [facility] and check in at the registration area.   Wear comfortable clothing and clothing appropriate for easy access to any Portacath or PICC line.   We strive to give you quality time with your provider. You may need to reschedule your appointment if you arrive late (15 or more minutes).  Arriving late affects you and other patients whose appointments are after yours.  Also, if you miss three or more appointments without notifying the office, you may be dismissed from the clinic at the provider's discretion.      For prescription refill requests, have your pharmacy contact our office and allow 72 hours for refills to be completed.    Today you received the following chemotherapy and/or immunotherapy agents: mogamulizumab.      To help prevent nausea and vomiting after your treatment, we encourage you to take your nausea medication as directed.  BELOW ARE SYMPTOMS THAT SHOULD BE REPORTED IMMEDIATELY: *FEVER GREATER THAN 100.4 F (38 C) OR HIGHER *CHILLS OR SWEATING *NAUSEA AND VOMITING THAT IS NOT CONTROLLED WITH YOUR NAUSEA MEDICATION *UNUSUAL SHORTNESS OF BREATH *UNUSUAL BRUISING OR BLEEDING *URINARY PROBLEMS (pain or burning when urinating, or frequent urination) *BOWEL PROBLEMS (unusual diarrhea, constipation, pain near the anus) TENDERNESS IN MOUTH AND THROAT WITH OR WITHOUT PRESENCE OF ULCERS (sore throat, sores in mouth, or a toothache) UNUSUAL RASH, SWELLING OR PAIN  UNUSUAL VAGINAL DISCHARGE OR ITCHING   Items with * indicate a potential emergency and should be followed up as soon as possible or go to the Emergency Department if any problems should occur.  Please show the CHEMOTHERAPY ALERT CARD or IMMUNOTHERAPY ALERT CARD at  check-in to the Emergency Department and triage nurse.  Should you have questions after your visit or need to cancel or reschedule your appointment, please contact [facility]  Dept: [phone]  and follow the prompts.  Office hours are 8:00 a.m. to 4:30 p.m. Monday - Friday. Please note that voicemails left after 4:00 p.m. may not be returned until the following business day.  We are closed weekends and major holidays. You have access to a nurse at all times for urgent questions. Please call the main number to the clinic Dept: [phone] and follow the prompts.   For any non-urgent questions, you may also contact your provider using MyChart. We now offer e-Visits for anyone 88 and older to request care online for non-urgent symptoms. For details visit [email or web address].   Also download the MyChart app! Go to the app store, search "MyChart", open the app, select , and log in with your MyChart username and password.  Due to Covid, a mask is required upon entering the hospital/clinic. If you do not have a mask, one will be given to you upon arrival. For doctor visits, patients may have 1 support person aged 9 or older with them. For treatment visits, patients cannot have anyone with them due to current Covid guidelines and our immunocompromised population.

## 2021-08-31 ENCOUNTER — Telehealth: Payer: Self-pay | Admitting: Student

## 2021-08-31 NOTE — Telephone Encounter (Signed)
That is fine she may increase metoprolol.

## 2021-08-31 NOTE — Telephone Encounter (Signed)
Patient would like to discuss metoprolol medication with [facility]. Her number is [phone].

## 2021-08-31 NOTE — Telephone Encounter (Signed)
Pt stated that lately she has been feeling her heart skip a beat recently. She has been taking tablet 12.5 of the metoprolol . Pt would like to know if she can go back to one 25 mg tablet twice a day. Pt stated her Bp and Hr have been pretty normal. She also said when she practices her breathing exercises she starts to feel better.

## 2021-09-01 NOTE — Telephone Encounter (Signed)
Called pt to inform her to it is ok to increase her metoprolol. Pt understood

## 2021-09-06 ENCOUNTER — Encounter: Payer: Self-pay | Admitting: Hematology

## 2021-09-06 ENCOUNTER — Other Ambulatory Visit: Payer: Self-pay

## 2021-09-06 DIAGNOSIS — C8590 Non-Hodgkin lymphoma, unspecified, unspecified site: Secondary | ICD-10-CM

## 2021-09-06 MED ORDER — PREDNISONE 50 MG PO TABS: ORAL_TABLET | ORAL | 0 refills | Status: DC

## 2021-09-07 ENCOUNTER — Other Ambulatory Visit: Payer: Self-pay

## 2021-09-07 DIAGNOSIS — C8590 Non-Hodgkin lymphoma, unspecified, unspecified site: Secondary | ICD-10-CM

## 2021-09-08 ENCOUNTER — Ambulatory Visit: Payer: No Typology Code available for payment source | Admitting: Hematology

## 2021-09-08 ENCOUNTER — Other Ambulatory Visit: Payer: Medicare Other

## 2021-09-08 ENCOUNTER — Ambulatory Visit: Payer: No Typology Code available for payment source

## 2021-09-08 ENCOUNTER — Other Ambulatory Visit: Payer: No Typology Code available for payment source

## 2021-09-08 ENCOUNTER — Ambulatory Visit: Payer: Self-pay

## 2021-09-14 NOTE — Progress Notes (Signed)
Pt called to let Dr Irene Limbo know she saw Dermatology today for her skin issues/ who consulted with [facility]cologist. Oncologist at [facility] and Dermatology wants pt to hold off with tx here until skin biopsy results and lab results come back. Dr Irene Limbo informed. Pt to update when results come back as to next steps. 10/06/21 appointments to remain in place for now.

## 2021-09-15 ENCOUNTER — Other Ambulatory Visit: Payer: Medicare Other

## 2021-09-15 ENCOUNTER — Ambulatory Visit: Payer: Medicare Other

## 2021-09-15 ENCOUNTER — Telehealth: Payer: Self-pay | Admitting: Student

## 2021-09-15 ENCOUNTER — Other Ambulatory Visit: Payer: Self-pay

## 2021-09-15 ENCOUNTER — Ambulatory Visit: Payer: Medicare Other | Admitting: Hematology

## 2021-09-15 DIAGNOSIS — R002 Palpitations: Secondary | ICD-10-CM

## 2021-09-15 DIAGNOSIS — I10 Essential (primary) hypertension: Secondary | ICD-10-CM

## 2021-09-15 MED ORDER — AMLODIPINE BESYLATE 5 MG PO TABS: 5.0000 mg | ORAL_TABLET | Freq: Every day | ORAL | 3 refills | Status: DC

## 2021-09-15 NOTE — Telephone Encounter (Signed)
Patient requesting refill for amlodipine. Says she will be out in about 3 days.

## 2021-09-15 NOTE — Telephone Encounter (Signed)
Sent!

## 2021-09-22 ENCOUNTER — Other Ambulatory Visit: Payer: Medicare Other

## 2021-09-22 ENCOUNTER — Other Ambulatory Visit: Payer: No Typology Code available for payment source

## 2021-09-22 ENCOUNTER — Ambulatory Visit: Payer: Medicare Other | Admitting: Hematology

## 2021-09-22 ENCOUNTER — Ambulatory Visit: Payer: Self-pay

## 2021-09-22 ENCOUNTER — Ambulatory Visit: Payer: No Typology Code available for payment source

## 2021-10-06 ENCOUNTER — Ambulatory Visit: Payer: Medicare Other

## 2021-10-06 ENCOUNTER — Ambulatory Visit: Payer: Medicare Other | Admitting: Hematology

## 2021-10-06 ENCOUNTER — Other Ambulatory Visit: Payer: Medicare Other

## 2021-10-13 ENCOUNTER — Ambulatory Visit: Payer: Medicare Other

## 2021-10-13 ENCOUNTER — Inpatient Hospital Stay: Payer: Medicare Other | Attending: Hematology | Admitting: Hematology

## 2021-10-13 ENCOUNTER — Other Ambulatory Visit: Payer: Medicare Other

## 2021-10-13 ENCOUNTER — Other Ambulatory Visit: Payer: Self-pay

## 2021-10-13 VITALS — BP 140/75 | HR 58 | Temp 98.1°F | Resp 18 | Wt 139.2 lb

## 2021-10-13 DIAGNOSIS — E785 Hyperlipidemia, unspecified: Secondary | ICD-10-CM | POA: Insufficient documentation

## 2021-10-13 DIAGNOSIS — M8580 Other specified disorders of bone density and structure, unspecified site: Secondary | ICD-10-CM | POA: Insufficient documentation

## 2021-10-13 DIAGNOSIS — C8410 Sezary disease, unspecified site: Secondary | ICD-10-CM | POA: Diagnosis not present

## 2021-10-13 DIAGNOSIS — K219 Gastro-esophageal reflux disease without esophagitis: Secondary | ICD-10-CM | POA: Diagnosis not present

## 2021-10-13 DIAGNOSIS — C8408 Mycosis fungoides, lymph nodes of multiple sites: Secondary | ICD-10-CM | POA: Diagnosis not present

## 2021-10-13 DIAGNOSIS — Z800 Family history of malignant neoplasm of digestive organs: Secondary | ICD-10-CM | POA: Diagnosis not present

## 2021-10-13 DIAGNOSIS — Z86010 Personal history of colonic polyps: Secondary | ICD-10-CM | POA: Insufficient documentation

## 2021-10-13 DIAGNOSIS — Z85048 Personal history of other malignant neoplasm of rectum, rectosigmoid junction, and anus: Secondary | ICD-10-CM | POA: Diagnosis not present

## 2021-10-13 DIAGNOSIS — Z79899 Other long term (current) drug therapy: Secondary | ICD-10-CM | POA: Diagnosis not present

## 2021-10-13 DIAGNOSIS — Z5112 Encounter for antineoplastic immunotherapy: Secondary | ICD-10-CM | POA: Diagnosis not present

## 2021-10-13 DIAGNOSIS — Z5111 Encounter for antineoplastic chemotherapy: Secondary | ICD-10-CM

## 2021-10-14 ENCOUNTER — Telehealth: Payer: Self-pay | Admitting: Hematology

## 2021-10-14 NOTE — Telephone Encounter (Signed)
Left message with follow-up appointment per 12/7 los. 

## 2021-10-19 ENCOUNTER — Encounter: Payer: Self-pay | Admitting: Hematology

## 2021-10-19 NOTE — Progress Notes (Addendum)
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 female who is here for a delayed follow-up for her mycosis fungoides/Sezary syndrome which is being comanaged with Dr. Lanier Ensign at [facility]. Her last clinic visit with [place] was on 08/11/2021 and her last mogamulizumab treatment was on 08/25/2021.  Her steroid premedications were being cut down based on patient's preference and she had chosen to discontinue her posttreatment steroids due to concern of steroid use. Patient had flare of her skin involvement and has followed with dermatology at [facility] and had a skin biopsy that primarily showed reactionary changes.  There was not any overt evidence of regression of her mycosis fungoides in any clinically symptomatic fashion.  Patient was treated with steroids with improvement in her skin back to baseline.  She has been seen in follow-up with Dr. Lanier Ensign was recommended to go back on mogamulizumab q. monthly to evaluate for tolerance.  Prior to cutting down her steroids patient was tolerating the mogamulizumab without any prohibitive toxicities.  Patient notes no new lumps or bumps.  No fevers no chills no night sweats. At this time she notes her rash is relatively stable.  She is okay with [place] increasing her dose of steroids for premedication and for her 3 to 5-day post treatment.   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[place])    Osteopenia    bilateral hips   Rectal cancer ([place]) 04/2017    SURGICAL HISTORY: Past Surgical History:  Procedure Laterality Date   BREAST BIOPSY     COLONOSCOPY  03/07/2012   with biopsy   DILATATION & CURRETTAGE/HYSTEROSCOPY WITH RESECTOCOPE N/A 06/02/2014   Procedure: DILATATION & CURETTAGE/HYSTEROSCOPY WITH RESECTOCOPE;  Surgeon: Lyman Speller, MD;  Location: [facility];  Service: Gynecology;  Laterality: N/A;   FOOT SURGERY     Right    TRANSANAL EXCISION OF RECTAL MASS N/A 06/09/2017   Procedure: TRANSANAL EXCISION OF RECTAL MASS;  Surgeon: [name], MD;  Location: [facility];  Service: General;  Laterality: N/A;    SOCIAL HISTORY: Social History   Socioeconomic History   Marital status: Single    Spouse name: Not on file   Number of children: 0   Years of education: Not on file   Highest education level: Not on file  Occupational History   Not on file  Tobacco Use   Smoking status: Never   Smokeless tobacco: Never  Vaping Use   Vaping Use: Never used  Substance and Sexual Activity   Alcohol use: Yes    Alcohol/week: 0.0 - 1.0 standard drinks   Drug use: No   Sexual activity: Not  Currently    Partners: Male    Birth control/protection: Post-menopausal  Other Topics Concern   Not on file  Social History Narrative   Not on file   Social Determinants of Health   Financial Resource Strain: Not on file  Food Insecurity: Not on file  Transportation Needs: Not on file  Physical Activity: Not on file  Stress: Not on file  Social Connections: Not on file  Intimate Partner Violence: Not on file    FAMILY HISTORY: Family History  Problem Relation Age of Onset    Anuerysm Father    Heart disease Father    Diabetes Father    Cancer Father        unknown type   Hypertension Father    Heart attack Mother    Heart disease Mother    Kidney disease Mother    Kidney cancer Mother        contained tumor   Hypertension Mother    Heart attack Brother    Cancer Brother        liver cancer   Melanoma Brother    Other Brother        Bypass surgery    Hypertension Sister    Ovarian cancer Paternal Grandmother        was PMP   Colon cancer Neg Hx     ALLERGIES:  is allergic to ciprofloxacin, diphenhydramine hcl, and sudafed [pseudoephedrine hcl].  MEDICATIONS:  Current Outpatient Medications  Medication Sig Dispense Refill   amLODipine (NORVASC) 5 MG tablet Take 1 tablet (5 mg total) by mouth daily. 90 tablet 3   B Complex Vitamins (VITAMIN B COMPLEX) TABS Take by mouth. 5000 UT     Biotin 5000 MCG CAPS      cetirizine (ZYRTEC) 10 MG tablet Take 10 mg by mouth daily.     Cholecalciferol (VITAMIN D) 125 MCG (5000 UT) CAPS      clotrimazole (LOTRIMIN) 1 % cream Apply topically 2 (two) times daily.     Cod Liver Oil 1000 MG CAPS Take by mouth.     ELDERBERRY PO Take by mouth daily. (Patient not taking: Reported on 07/14/2021)     ezetimibe (ZETIA) 10 MG tablet Take 1 tablet (10 mg total) by mouth daily. 30 tablet 3   famotidine (PEPCID) 20 MG tablet Take 20 mg by mouth daily.     fluconazole (DIFLUCAN) 100 MG tablet Take 1 tablet (100 mg total) by mouth daily. 7 tablet 0   metoprolol tartrate (LOPRESSOR) 25 MG tablet Take 0.5 tablets (12.5 mg total) by mouth daily.     PARoxetine (PAXIL) 10 MG tablet Take 1 tablet (10 mg total) by mouth daily. (Patient not taking: Reported on 07/14/2021) 30 tablet 5   predniSONE (DELTASONE) 50 MG tablet Take 1 tab (50mg ) orally with breakfast for 5 days starting the morning after each Mogamulizumab treatment 30 tablet 0   Probiotic Product (PROBIOTIC DAILY PO) Take by mouth.     sodium chloride 0.9 % SOLN 250 mL with  mogamulizumab-kpkc 20 MG/5ML SOLN 1 mg/kg Inject 1 mg/kg into the vein.     tacrolimus (PROTOPIC) 0.1 % ointment AAA BID to face PRN     triamcinolone cream (KENALOG) 0.1 %      No current facility-administered medications for this visit.    REVIEW OF SYSTEMS:   .10 Point review of Systems was done is negative except as noted above.    PHYSICAL EXAMINATION: ECOG PERFORMANCE STATUS: 1 - Symptomatic but completely  ambulatory .BP 140/75    Pulse (!) 58    Temp 98.1 F (36.7 C)    Resp 18    Wt 139 lb 3.2 oz (63.1 kg)    LMP 11/07/2002    SpO2 99%    BMI 25.46 kg/m  . GENERAL:alert, in no acute distress and comfortable SKIN: No new apparent skin involvement from mycosis fungoides. EYES: conjunctiva are pink and non-injected, sclera anicteric OROPHARYNX: MMM, no exudates, no oropharyngeal erythema or ulceration NECK: supple, no JVD LYMPH:  no palpable lymphadenopathy in the cervical, axillary or inguinal regions LUNGS: clear to auscultation b/l with normal respiratory effort HEART: regular rate & rhythm ABDOMEN:  normoactive bowel sounds , non tender, not distended. Extremity: no pedal edema PSYCH: alert & oriented x 3 with fluent speech NEURO: no focal motor/sensory deficits      LABORATORY DATA:  I have reviewed the data as listed  . CBC Latest Ref Rng & Units 08/25/2021 08/11/2021 07/28/2021  WBC 4.0 - 10.5 K/uL 5.4 4.2 4.5  Hemoglobin 12.0 - 15.0 g/dL 14.0 14.0 14.1  Hematocrit 36.0 - 46.0 % 39.1 39.8 38.6  Platelets 150 - 400 K/uL 215 272 206    . CMP Latest Ref Rng & Units 08/25/2021 08/11/2021 07/28/2021  Glucose 70 - 99 mg/dL 99 99 117(H)  BUN 8 - 23 mg/dL 19 20 18   Creatinine 0.44 - 1.00 mg/dL 0.93 1.44(H) 0.98  Sodium 135 - 145 mmol/L 140 138 140  Potassium 3.5 - 5.1 mmol/L 4.2 4.5 4.1  Chloride 98 - 111 mmol/L 107 106 108  CO2 22 - 32 mmol/L 23 22 22   Calcium 8.9 - 10.3 mg/dL 9.4 9.7 9.4  Total Protein 6.5 - 8.1 g/dL 7.4 7.7 7.3  Total Bilirubin 0.3 - 1.2  mg/dL 0.8 0.7 0.7  Alkaline Phos 38 - 126 U/L 79 108 72  AST 15 - 41 U/L 19 29 18   ALT 0 - 44 U/L 11 42 12   02/22/2021 Sezary Cell Analysis  9% Sezary-like cells present.   02/22/2021 Flow Cytometry  CD4+/CD7- cells are 21% of the total lymphoid gate. CD4+/CD26- cells are 22% of the total lymphoid gate. The CD4:CD8 ratio of the T-cells is 1.5:1. There is no evidence suggestive of T-cell clonality.  Sezary Cell Analysis Order: [id number] Component 05/24/2021  Sezary Cell  Of 100 lymphoid cells, 2 are counted as Sezary like cells.   Sezary Cells    Pathologist Signature  Lynnae Sandhoff, M.D.    09/14/2021 INTERPRETATION    Extracted DNA ([id number]) from fresh skin tissue (T Cell Clonality (TCR Beta)):   Negative.    No monoclonal T-cell population detected by TCR beta chain PCR.     Flow Cytometry                                    Case: [id number]                                Authorizing Provider:  Renaldo Harrison, MD    Collected:           09/14/2021 1019              Ordering Location:     [facility]           Received:  09/14/2021 1308              Pathologist:           Sherian Maroon, MD                                                              Specimen:    Blood                                                                                  DIAGNOSIS    A. Peripheral blood, flow cytometric immunophenotyping: 2% abnormal T-cell population with an atypical immunophenotype of CD2 dim+/ CD3+/ CD4+/ CD5 dim+/ CD7-/ CD8-/ CD26-.   Comment: The phenotype of the abnormal T cells matches that seen in the prior analyses and the clonal T-cell population is similar to that seen in the skin lesion, indicating the presence of circulating clonal "Sezary" cells.      RADIOGRAPHIC STUDIES: I have personally reviewed the radiological images as listed and agreed with the findings in the report. No results found.   ASSESSMENT & PLAN:   70 yo with   1) CTCL with  previous treatment as noted above. Mycosis fungoides with Sezary syndrome 2) grade 1 through 2 skin reaction from mogamulizumab controlled previously with steroids. PLAN: -I discussed patients clinical course since her last clinic visit. -We reviewed her outside recommendations from her dermatologist and [place] at [facility]. -We discussed pros versus cons of restarting her mogamulizumab treatment with bump in her dose of steroids and a 3-day posttreatment course of prednisone along with H1 and H2 blockers. -We will increase her pretreatment Solu-Medrol back to 125 mg. -She will take her Zyrtec, Pepcid and prednisone for 5 days after each treatment.  May cut down the prednisone to 3 days after each treatment if this next treatment is tolerated. -Mogamulizumab orders changed to q. monthly per recommendations to oncology. -We shall continue to provide her local oncology treatment help in tandem with her primary care plan driven by [facility] oncology.  She will continue to follow with Dr. Lanier Ensign. -She will continue to use her light box at home as per dermatology recommendations.  FOLLOW UP: Switching mogamulizumab to 4 weeks with port flush and labs Next monthly treatment on 10/20/2021. Please cancel lab and mogamulizumab treatment schedule for 11/03/2021. Please schedule next mogamulizumab treatment 4 weeks after 10/20/2021 with port flush labs and MD visit   All of the patients questions were answered with apparent satisfaction. The patient knows to call the clinic with any problems, questions or concerns.  . The total time spent in the appointment was 32 minutes including review of outside oncology and dermatology records, detailed goals of care discussion with the patient, adjustment of treatment plans, placing new orders and order modification for mogamulizumab.  Sullivan Lone MD MS AAHIVMS [facility] Hematology/Oncology Physician [facility]  .

## 2021-10-20 ENCOUNTER — Other Ambulatory Visit: Payer: Self-pay

## 2021-10-20 ENCOUNTER — Inpatient Hospital Stay: Payer: Medicare Other

## 2021-10-20 ENCOUNTER — Other Ambulatory Visit: Payer: Medicare Other

## 2021-10-20 VITALS — BP 153/85 | HR 58 | Temp 98.0°F | Resp 18 | Wt 142.0 lb

## 2021-10-20 DIAGNOSIS — Z7189 Other specified counseling: Secondary | ICD-10-CM

## 2021-10-20 DIAGNOSIS — C8408 Mycosis fungoides, lymph nodes of multiple sites: Secondary | ICD-10-CM

## 2021-10-20 DIAGNOSIS — C8590 Non-Hodgkin lymphoma, unspecified, unspecified site: Secondary | ICD-10-CM

## 2021-10-20 DIAGNOSIS — Z5112 Encounter for antineoplastic immunotherapy: Secondary | ICD-10-CM | POA: Diagnosis not present

## 2021-10-20 LAB — CBC WITH DIFFERENTIAL (CANCER CENTER ONLY)
Abs Immature Granulocytes: 0.01 10*3/uL (ref 0.00–0.07)
Basophils Absolute: 0.1 10*3/uL (ref 0.0–0.1)
Basophils Relative: 1 %
Eosinophils Absolute: 0.1 10*3/uL (ref 0.0–0.5)
Eosinophils Relative: 2 %
HCT: 40 % (ref 36.0–46.0)
Hemoglobin: 13.8 g/dL (ref 12.0–15.0)
Immature Granulocytes: 0 %
Lymphocytes Relative: 16 %
Lymphs Abs: 0.8 10*3/uL (ref 0.7–4.0)
MCH: 32.5 pg (ref 26.0–34.0)
MCHC: 34.5 g/dL (ref 30.0–36.0)
MCV: 94.3 fL (ref 80.0–100.0)
Monocytes Absolute: 0.4 10*3/uL (ref 0.1–1.0)
Monocytes Relative: 9 %
Neutro Abs: 3.4 10*3/uL (ref 1.7–7.7)
Neutrophils Relative %: 72 %
Platelet Count: 220 10*3/uL (ref 150–400)
RBC: 4.24 MIL/uL (ref 3.87–5.11)
RDW: 12 % (ref 11.5–15.5)
WBC Count: 4.7 10*3/uL (ref 4.0–10.5)
nRBC: 0 % (ref 0.0–0.2)

## 2021-10-20 LAB — CMP (CANCER CENTER ONLY)
ALT: 13 U/L (ref 0–44)
AST: 21 U/L (ref 15–41)
Albumin: 3.9 g/dL (ref 3.5–5.0)
Alkaline Phosphatase: 81 U/L (ref 38–126)
Anion gap: 9 (ref 5–15)
BUN: 19 mg/dL (ref 8–23)
CO2: 23 mmol/L (ref 22–32)
Calcium: 9 mg/dL (ref 8.9–10.3)
Chloride: 108 mmol/L (ref 98–111)
Creatinine: 0.9 mg/dL (ref 0.44–1.00)
GFR, Estimated: >60 mL/min (ref >60)
Glucose, Bld: 97 mg/dL (ref 70–99)
Potassium: 4.1 mmol/L (ref 3.5–5.1)
Sodium: 140 mmol/L (ref 135–145)
Total Bilirubin: 0.7 mg/dL (ref 0.3–1.2)
Total Protein: 7.3 g/dL (ref 6.5–8.1)

## 2021-10-20 LAB — LACTATE DEHYDROGENASE: LDH: 167 U/L (ref 98–192)

## 2021-10-20 MED ORDER — SODIUM CHLORIDE 0.9 % IV SOLN
1.0000 mg/kg | Freq: Once | INTRAVENOUS | Status: AC
  Administered 2021-10-20: 12:00:00 60 mg via INTRAVENOUS
  Filled 2021-10-20: qty 15

## 2021-10-20 MED ORDER — METHYLPREDNISOLONE SODIUM SUCC 125 MG IJ SOLR
125.0000 mg | Freq: Once | INTRAMUSCULAR | Status: AC
  Administered 2021-10-20: 11:00:00 125 mg via INTRAVENOUS
  Filled 2021-10-20: qty 2

## 2021-10-20 MED ORDER — DIPHENHYDRAMINE HCL 25 MG PO CAPS
25.0000 mg | ORAL_CAPSULE | Freq: Once | ORAL | Status: AC
  Administered 2021-10-20: 11:00:00 25 mg via ORAL
  Filled 2021-10-20: qty 1

## 2021-10-20 MED ORDER — FAMOTIDINE 20 MG IN NS 100 ML IVPB
20.0000 mg | Freq: Once | INTRAVENOUS | Status: AC
  Administered 2021-10-20: 11:00:00 20 mg via INTRAVENOUS
  Filled 2021-10-20: qty 100

## 2021-10-20 MED ORDER — ACETAMINOPHEN 325 MG PO TABS
650.0000 mg | ORAL_TABLET | Freq: Once | ORAL | Status: AC
  Administered 2021-10-20: 11:00:00 650 mg via ORAL
  Filled 2021-10-20: qty 2

## 2021-10-20 NOTE — Patient Instructions (Signed)
[facility]  Discharge Instructions: Thank you for choosing [facility] to provide your oncology and hematology care.   If you have a lab appointment with the [facility], please go directly to the [facility] and check in at the registration area.   Wear comfortable clothing and clothing appropriate for easy access to any Portacath or PICC line.   We strive to give you quality time with your provider. You may need to reschedule your appointment if you arrive late (15 or more minutes).  Arriving late affects you and other patients whose appointments are after yours.  Also, if you miss three or more appointments without notifying the office, you may be dismissed from the clinic at the providers discretion.      For prescription refill requests, have your pharmacy contact our office and allow 72 hours for refills to be completed.    Today you received the following chemotherapy and/or immunotherapy agents mogamulizumab      To help prevent nausea and vomiting after your treatment, we encourage you to take your nausea medication as directed.  BELOW ARE SYMPTOMS THAT SHOULD BE REPORTED IMMEDIATELY: *FEVER GREATER THAN 100.4 F (38 C) OR HIGHER *CHILLS OR SWEATING *NAUSEA AND VOMITING THAT IS NOT CONTROLLED WITH YOUR NAUSEA MEDICATION *UNUSUAL SHORTNESS OF BREATH *UNUSUAL BRUISING OR BLEEDING *URINARY PROBLEMS (pain or burning when urinating, or frequent urination) *BOWEL PROBLEMS (unusual diarrhea, constipation, pain near the anus) TENDERNESS IN MOUTH AND THROAT WITH OR WITHOUT PRESENCE OF ULCERS (sore throat, sores in mouth, or a toothache) UNUSUAL RASH, SWELLING OR PAIN  UNUSUAL VAGINAL DISCHARGE OR ITCHING   Items with * indicate a potential emergency and should be followed up as soon as possible or go to the Emergency Department if any problems should occur.  Please show the CHEMOTHERAPY ALERT CARD or IMMUNOTHERAPY ALERT CARD at check-in  to the Emergency Department and triage nurse.  Should you have questions after your visit or need to cancel or reschedule your appointment, please contact [facility]  Dept: [phone]  and follow the prompts.  Office hours are 8:00 a.m. to 4:30 p.m. Monday - Friday. Please note that voicemails left after 4:00 p.m. may not be returned until the following business day.  We are closed weekends and major holidays. You have access to a nurse at all times for urgent questions. Please call the main number to the clinic Dept: [phone] and follow the prompts.   For any non-urgent questions, you may also contact your provider using MyChart. We now offer e-Visits for anyone 53 and older to request care online for non-urgent symptoms. For details visit [email or web address].   Also download the MyChart app! Go to the app store, search "MyChart", open the app, select [facility], and log in with your MyChart username and password.  Due to Covid, a mask is required upon entering the hospital/clinic. If you do not have a mask, one will be given to you upon arrival. For doctor visits, patients may have 1 support person aged 21 or older with them. For treatment visits, patients cannot have anyone with them due to current Covid guidelines and our immunocompromised population.

## 2021-11-03 ENCOUNTER — Other Ambulatory Visit: Payer: Medicare Other

## 2021-11-03 ENCOUNTER — Ambulatory Visit: Payer: Medicare Other

## 2021-11-09 ENCOUNTER — Encounter (HOSPITAL_BASED_OUTPATIENT_CLINIC_OR_DEPARTMENT_OTHER): Payer: Self-pay | Admitting: Obstetrics & Gynecology

## 2021-11-10 ENCOUNTER — Encounter (HOSPITAL_BASED_OUTPATIENT_CLINIC_OR_DEPARTMENT_OTHER): Payer: Self-pay | Admitting: Obstetrics & Gynecology

## 2021-11-10 ENCOUNTER — Other Ambulatory Visit (HOSPITAL_COMMUNITY)
Admission: RE | Admit: 2021-11-10 | Discharge: 2021-11-10 | Disposition: A | Discharge status: Home / Self Care | Payer: Medicare Other | Source: Ambulatory Visit | Attending: Obstetrics & Gynecology | Admitting: Obstetrics & Gynecology

## 2021-11-10 ENCOUNTER — Other Ambulatory Visit (HOSPITAL_BASED_OUTPATIENT_CLINIC_OR_DEPARTMENT_OTHER): Payer: Self-pay | Admitting: Obstetrics & Gynecology

## 2021-11-10 ENCOUNTER — Ambulatory Visit (INDEPENDENT_AMBULATORY_CARE_PROVIDER_SITE_OTHER): Payer: Medicare Other | Admitting: Obstetrics & Gynecology

## 2021-11-10 VITALS — BP 132/77 | HR 60 | Ht 62.5 in | Wt 137.6 lb

## 2021-11-10 DIAGNOSIS — R21 Rash and other nonspecific skin eruption: Secondary | ICD-10-CM | POA: Diagnosis present

## 2021-11-10 DIAGNOSIS — M810 Age-related osteoporosis without current pathological fracture: Secondary | ICD-10-CM | POA: Diagnosis not present

## 2021-11-10 NOTE — Progress Notes (Signed)
GYNECOLOGY  VISIT  CC:   several questions  HPI: 71 y.o. 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  She is having issues with bone thinning and has concerns about steroids being given to help reaction to infusion medication as well as oral prednisone when needed for skin reactions.  She has read about Prolia for treatment for osteoporosis but is aware there are some increased risks with patients who are immunocompromised.  Discussed these risks and concerns today.  Would consider bisphosphonate for treatment if needed.  She receives 125mg  solumedrol IV with infusion to help with skin reaction.  She also has a 50mg  oral prednisone.  Also, having issues with rash beneath breasts and near groin region.  She did see Harold Hedge.  She thought the skin issues were yeast and treated her with fluconazole.  This has not helped.  Pt has experienced a couple of UTIs in the last year.  These were not within 6 months.  Vaginal estrogen was recommended.    GYNECOLOGIC HISTORY: Patient's last menstrual period was 11/07/2002.   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[place])    Osteopenia    bilateral hips   Rectal cancer ([place]) 04/2017    Past Surgical History:  Procedure Laterality Date   BREAST BIOPSY     COLONOSCOPY  03/07/2012   with biopsy   DILATATION & CURRETTAGE/HYSTEROSCOPY WITH RESECTOCOPE N/A 06/02/2014   Procedure: DILATATION & CURETTAGE/HYSTEROSCOPY WITH RESECTOCOPE;  Surgeon: Lyman Speller, MD;  Location: [facility];  Service: Gynecology;  Laterality: N/A;   FOOT SURGERY     Right    TRANSANAL EXCISION OF RECTAL MASS N/A 06/09/2017   Procedure: TRANSANAL EXCISION OF RECTAL MASS;  Surgeon: [name], MD;  Location: [facility];  Service: General;  Laterality: N/A;    MEDS:   Current Outpatient Medications on File Prior to Visit  Medication Sig Dispense Refill   amLODipine (NORVASC) 5 MG tablet Take 1 tablet (5 mg total) by mouth daily. 90 tablet 3   B Complex Vitamins (VITAMIN B COMPLEX) TABS Take by mouth. 5000 UT     Biotin 5000 MCG CAPS      cetirizine (ZYRTEC) 10 MG tablet Take 10 mg by mouth daily.     Cholecalciferol (VITAMIN D) 125 MCG (5000 UT) CAPS      clotrimazole (LOTRIMIN)  1 % cream Apply topically 2 (two) times daily.     Cod Liver Oil 1000 MG CAPS Take by mouth.     Cranberry 1000 MG CAPS Take by mouth.     famotidine (PEPCID) 20 MG tablet Take 20 mg by mouth daily.     predniSONE (DELTASONE) 50 MG tablet Take 1 tab (50mg ) orally with breakfast for 5 days starting the morning after each Mogamulizumab treatment 30 tablet 0   Probiotic Product (PROBIOTIC DAILY PO) Take by mouth.     sodium chloride 0.9 % SOLN 250 mL with mogamulizumab-kpkc 20 MG/5ML SOLN 1 mg/kg Inject 1 mg/kg into the vein.     triamcinolone cream  (KENALOG) 0.1 %      ELDERBERRY PO Take by mouth daily. (Patient not taking: Reported on 07/14/2021)     ezetimibe (ZETIA) 10 MG tablet Take 1 tablet (10 mg total) by mouth daily. 30 tablet 3   fluconazole (DIFLUCAN) 100 MG tablet Take 1 tablet (100 mg total) by mouth daily. (Patient not taking: Reported on 11/10/2021) 7 tablet 0   metoprolol tartrate (LOPRESSOR) 25 MG tablet Take 0.5 tablets (12.5 mg total) by mouth daily.     PARoxetine (PAXIL) 10 MG tablet Take 1 tablet (10 mg total) by mouth daily. (Patient not taking: Reported on 07/14/2021) 30 tablet 5   tacrolimus (PROTOPIC) 0.1 % ointment AAA BID to face PRN (Patient not taking: Reported on 11/10/2021)     No current facility-administered medications on file prior to visit.    ALLERGIES: Ciprofloxacin, Diphenhydramine hcl, and Sudafed [pseudoephedrine hcl]  Family History  Problem Relation Age of Onset   Anuerysm Father    Heart disease Father    Diabetes Father    Cancer Father        unknown type   Hypertension Father    Heart attack Mother    Heart disease Mother    Kidney disease Mother    Kidney cancer Mother        contained tumor   Hypertension Mother    Heart attack Brother    Cancer Brother        liver cancer   Melanoma Brother    Other Brother        Bypass surgery    Hypertension Sister    Ovarian cancer Paternal Grandmother        was PMP   Colon cancer Neg Hx     SH:  single, non smoker  Review of Systems  Constitutional: Negative.   Skin:  Positive for rash.   PHYSICAL EXAMINATION:    BP 132/77 (BP Location: Right Arm, Patient Position: Sitting, Cuff Size: Normal)    Pulse 60    Ht 5' 2.5" (1.588 m)    Wt 137 lb 9.6 oz (62.4 kg)    LMP 11/07/2002    BMI 24.77 kg/m     General appearance: alert, cooperative and appears stated age Skin:  thickened, erythematous rash beneath breasts, rare satellite lesion Lymph:  no inguinal LAD noted  Pelvic: External genitalia:  thickened, erythematous rash, rare  satellite lesion note, findings are similar to breast findings, non inner labia majora lesions              Chaperone, Octaviano Batty, CMA, was present for exam.  Assessment/Plan: 1. Skin rash - do not feel this is yeast but testing obtained today - Cervicovaginal ancillary only( [facility]) - she is going to start topical triamcinolone ointment.  Has rx  2. Age-related  osteoporosis without current pathological fracture - BMD due again this summer - possible medications for treatment discussed

## 2021-11-11 ENCOUNTER — Telehealth: Payer: Self-pay | Admitting: Hematology

## 2021-11-11 LAB — CERVICOVAGINAL ANCILLARY ONLY
Candida Glabrata: NEGATIVE
Candida Vaginitis: NEGATIVE
Comment: NEGATIVE
Comment: NEGATIVE

## 2021-11-11 NOTE — Telephone Encounter (Signed)
Returned patient's call to change upcoming port flush to just labs. Patient requested to schedule next two treatments.

## 2021-11-15 ENCOUNTER — Ambulatory Visit (HOSPITAL_BASED_OUTPATIENT_CLINIC_OR_DEPARTMENT_OTHER): Payer: Medicare Other | Admitting: Obstetrics & Gynecology

## 2021-11-17 ENCOUNTER — Other Ambulatory Visit: Payer: Medicare Other

## 2021-11-17 ENCOUNTER — Other Ambulatory Visit: Payer: Self-pay

## 2021-11-17 ENCOUNTER — Inpatient Hospital Stay: Payer: Medicare Other

## 2021-11-17 ENCOUNTER — Inpatient Hospital Stay: Payer: Medicare Other | Attending: Hematology | Admitting: Hematology

## 2021-11-17 VITALS — BP 126/73 | HR 56 | Temp 98.7°F | Resp 16 | Wt 139.5 lb

## 2021-11-17 DIAGNOSIS — Z7189 Other specified counseling: Secondary | ICD-10-CM

## 2021-11-17 DIAGNOSIS — C8408 Mycosis fungoides, lymph nodes of multiple sites: Secondary | ICD-10-CM

## 2021-11-17 DIAGNOSIS — Z5112 Encounter for antineoplastic immunotherapy: Secondary | ICD-10-CM | POA: Insufficient documentation

## 2021-11-17 DIAGNOSIS — C8410 Sezary disease, unspecified site: Secondary | ICD-10-CM | POA: Insufficient documentation

## 2021-11-17 DIAGNOSIS — Z79899 Other long term (current) drug therapy: Secondary | ICD-10-CM | POA: Diagnosis not present

## 2021-11-17 DIAGNOSIS — Z7952 Long term (current) use of systemic steroids: Secondary | ICD-10-CM | POA: Diagnosis not present

## 2021-11-17 DIAGNOSIS — Z5111 Encounter for antineoplastic chemotherapy: Secondary | ICD-10-CM

## 2021-11-17 DIAGNOSIS — C8590 Non-Hodgkin lymphoma, unspecified, unspecified site: Secondary | ICD-10-CM

## 2021-11-17 LAB — CMP (CANCER CENTER ONLY)
ALT: 14 U/L (ref 0–44)
AST: 18 U/L (ref 15–41)
Albumin: 4.1 g/dL (ref 3.5–5.0)
Alkaline Phosphatase: 81 U/L (ref 38–126)
Anion gap: 8 (ref 5–15)
BUN: 20 mg/dL (ref 8–23)
CO2: 26 mmol/L (ref 22–32)
Calcium: 9.2 mg/dL (ref 8.9–10.3)
Chloride: 104 mmol/L (ref 98–111)
Creatinine: 0.97 mg/dL (ref 0.44–1.00)
GFR, Estimated: >60 mL/min (ref >60)
Glucose, Bld: 100 mg/dL — ABNORMAL HIGH (ref 70–99)
Potassium: 3.9 mmol/L (ref 3.5–5.1)
Sodium: 138 mmol/L (ref 135–145)
Total Bilirubin: 0.6 mg/dL (ref 0.3–1.2)
Total Protein: 7.4 g/dL (ref 6.5–8.1)

## 2021-11-17 LAB — CBC WITH DIFFERENTIAL (CANCER CENTER ONLY)
Abs Immature Granulocytes: 0.03 10*3/uL (ref 0.00–0.07)
Basophils Absolute: 0.1 10*3/uL (ref 0.0–0.1)
Basophils Relative: 1 %
Eosinophils Absolute: 0.1 10*3/uL (ref 0.0–0.5)
Eosinophils Relative: 3 %
HCT: 40.3 % (ref 36.0–46.0)
Hemoglobin: 14.4 g/dL (ref 12.0–15.0)
Immature Granulocytes: 1 %
Lymphocytes Relative: 17 %
Lymphs Abs: 0.9 10*3/uL (ref 0.7–4.0)
MCH: 33.3 pg (ref 26.0–34.0)
MCHC: 35.7 g/dL (ref 30.0–36.0)
MCV: 93.1 fL (ref 80.0–100.0)
Monocytes Absolute: 0.5 10*3/uL (ref 0.1–1.0)
Monocytes Relative: 10 %
Neutro Abs: 3.7 10*3/uL (ref 1.7–7.7)
Neutrophils Relative %: 68 %
Platelet Count: 233 10*3/uL (ref 150–400)
RBC: 4.33 MIL/uL (ref 3.87–5.11)
RDW: 11.8 % (ref 11.5–15.5)
WBC Count: 5.4 10*3/uL (ref 4.0–10.5)
nRBC: 0 % (ref 0.0–0.2)

## 2021-11-17 LAB — LACTATE DEHYDROGENASE: LDH: 140 U/L (ref 98–192)

## 2021-11-17 MED ORDER — SODIUM CHLORIDE 0.9 % IV SOLN: Freq: Once | INTRAVENOUS | Status: AC

## 2021-11-17 MED ORDER — SODIUM CHLORIDE 0.9 % IV SOLN
1.0000 mg/kg | Freq: Once | INTRAVENOUS | Status: AC
  Administered 2021-11-17: 60 mg via INTRAVENOUS
  Filled 2021-11-17: qty 15

## 2021-11-17 MED ORDER — DIPHENHYDRAMINE HCL 25 MG PO CAPS
25.0000 mg | ORAL_CAPSULE | Freq: Once | ORAL | Status: AC
  Administered 2021-11-17: 25 mg via ORAL
  Filled 2021-11-17: qty 1

## 2021-11-17 MED ORDER — FAMOTIDINE 20 MG IN NS 100 ML IVPB
20.0000 mg | Freq: Once | INTRAVENOUS | Status: AC
  Administered 2021-11-17: 20 mg via INTRAVENOUS
  Filled 2021-11-17: qty 100

## 2021-11-17 MED ORDER — METHYLPREDNISOLONE SODIUM SUCC 125 MG IJ SOLR
125.0000 mg | Freq: Once | INTRAMUSCULAR | Status: AC
  Administered 2021-11-17: 125 mg via INTRAVENOUS
  Filled 2021-11-17: qty 2

## 2021-11-17 MED ORDER — ACETAMINOPHEN 325 MG PO TABS
650.0000 mg | ORAL_TABLET | Freq: Once | ORAL | Status: AC
  Administered 2021-11-17: 650 mg via ORAL
  Filled 2021-11-17: qty 2

## 2021-11-17 NOTE — Patient Instructions (Signed)
[facility]COL[facility]   Discharge Instructions: Thank you for choosing [facility] to provide your oncology and hematology care.   If you have a lab appointment with the [facility], please go directly to the [facility] and check in at the registration area.   Wear comfortable clothing and clothing appropriate for easy access to any Portacath or PICC line.   We strive to give you quality time with your provider. You may need to reschedule your appointment if you arrive late (15 or more minutes).  Arriving late affects you and other patients whose appointments are after yours.  Also, if you miss three or more appointments without notifying the office, you may be dismissed from the clinic at the providers discretion.      For prescription refill requests, have your pharmacy contact our office and allow 72 hours for refills to be completed.    Today you received the following chemotherapy and/or immunotherapy agents: Mogamulizumab       To help prevent nausea and vomiting after your treatment, we encourage you to take your nausea medication as directed.  BELOW ARE SYMPTOMS THAT SHOULD BE REPORTED IMMEDIATELY: *FEVER GREATER THAN 100.4 F (38 C) OR HIGHER *CHILLS OR SWEATING *NAUSEA AND VOMITING THAT IS NOT CONTROLLED WITH YOUR NAUSEA MEDICATION *UNUSUAL SHORTNESS OF BREATH *UNUSUAL BRUISING OR BLEEDING *URINARY PROBLEMS (pain or burning when urinating, or frequent urination) *BOWEL PROBLEMS (unusual diarrhea, constipation, pain near the anus) TENDERNESS IN MOUTH AND THROAT WITH OR WITHOUT PRESENCE OF ULCERS (sore throat, sores in mouth, or a toothache) UNUSUAL RASH, SWELLING OR PAIN  UNUSUAL VAGINAL DISCHARGE OR ITCHING   Items with * indicate a potential emergency and should be followed up as soon as possible or go to the Emergency Department if any problems should occur.  Please show the CHEMOTHERAPY ALERT CARD or IMMUNOTHERAPY ALERT CARD at  check-in to the Emergency Department and triage nurse.  Should you have questions after your visit or need to cancel or reschedule your appointment, please contact [facility]  Dept: [phone]  and follow the prompts.  Office hours are 8:00 a.m. to 4:30 p.m. Monday - Friday. Please note that voicemails left after 4:00 p.m. may not be returned until the following business day.  We are closed weekends and major holidays. You have access to a nurse at all times for urgent questions. Please call the main number to the clinic Dept: [phone] and follow the prompts.   For any non-urgent questions, you may also contact your provider using MyChart. We now offer e-Visits for anyone 26 and older to request care online for non-urgent symptoms. For details visit [email or web address].   Also download the MyChart app! Go to the app store, search "MyChart", open the app, select , and log in with your MyChart username and password.  Due to Covid, a mask is required upon entering the hospital/clinic. If you do not have a mask, one will be given to you upon arrival. For doctor visits, patients may have 1 support person aged 84 or older with them. For treatment visits, patients cannot have anyone with them due to current Covid guidelines and our immunocompromised population.

## 2021-11-18 ENCOUNTER — Other Ambulatory Visit: Payer: Medicare Other

## 2021-11-18 ENCOUNTER — Ambulatory Visit: Payer: Medicare Other | Admitting: Hematology

## 2021-11-18 ENCOUNTER — Ambulatory Visit: Payer: Medicare Other

## 2021-11-24 NOTE — Progress Notes (Signed)
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  She notes that he tolerated the last dose of mogamulizumab better with the increased dose of steroids as premedication and the 5 days of oral steroids posttreatment. She did have a little bit of a flare on her chest but this has since resolved. No other acute new symptoms. She would like to continue with the mogamulizumab only once a month. She is continue to follow with Dr. Lanier Ensign at [facility] for.  Flow cytometric and Sezary cell analysis monitoring. No fevers no chills no night sweats. No new lumps or bumps.  MEDICAL HISTORY:  Past Medical History:  Diagnosis Date   Adenomatous polyp    Cancer ([place])    cutaneous T-cell lymphoma; followed by Dr. 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[place])    Osteopenia    bilateral hips   Rectal cancer ([place]) 04/2017    SURGICAL HISTORY: Past Surgical History:  Procedure Laterality Date   BREAST BIOPSY     COLONOSCOPY  03/07/2012   with biopsy   DILATATION & CURRETTAGE/HYSTEROSCOPY WITH RESECTOCOPE N/A 06/02/2014   Procedure: DILATATION & CURETTAGE/HYSTEROSCOPY WITH RESECTOCOPE;  Surgeon: Lyman Speller, MD;  Location: [facility];  Service: Gynecology;  Laterality: N/A;   FOOT SURGERY     Right    TRANSANAL EXCISION OF RECTAL MASS N/A 06/09/2017   Procedure: TRANSANAL EXCISION OF RECTAL MASS;  Surgeon: [name], MD;  Location: [facility];  Service: General;  Laterality: N/A;    SOCIAL HISTORY: Social History   Socioeconomic History   Marital status: Single    Spouse name: Not on file   Number of children: 0   Years of education: Not on file   Highest education level: Not on file  Occupational History   Not on file  Tobacco Use   Smoking status: Never   Smokeless tobacco: Never  Vaping Use   Vaping Use: Never used  Substance and Sexual Activity   Alcohol use: Yes    Alcohol/week: 0.0 - 1.0 standard drinks   Drug use: No   Sexual activity: Not Currently    Partners: Male    Birth control/protection: Post-menopausal  Other Topics Concern   Not on file  Social History Narrative   Not on file   Social Determinants of Health   Financial Resource Strain: Not on file  Food Insecurity: Not on file  Transportation Needs: Not on file  Physical Activity: Not on file  Stress: Not on file  Social Connections: Not on file  Intimate Partner Violence: Not on file    FAMILY HISTORY: Family History  Problem Relation Age of Onset   Anuerysm Father    Heart disease Father    Diabetes Father  Cancer Father        unknown type   Hypertension Father    Heart attack Mother    Heart disease Mother    Kidney disease Mother    Kidney cancer Mother        contained tumor   Hypertension Mother    Heart attack Brother    Cancer Brother        liver cancer   Melanoma Brother    Other Brother        Bypass surgery    Hypertension Sister    Ovarian cancer Paternal Grandmother        was PMP   Colon cancer Neg Hx     ALLERGIES:  is allergic to ciprofloxacin, diphenhydramine hcl, and sudafed [pseudoephedrine hcl].  MEDICATIONS:  Current Outpatient Medications  Medication  Sig Dispense Refill   amLODipine (NORVASC) 5 MG tablet Take 1 tablet (5 mg total) by mouth daily. 90 tablet 3   B Complex Vitamins (VITAMIN B COMPLEX) TABS Take by mouth. 5000 UT     Biotin 5000 MCG CAPS      cetirizine (ZYRTEC) 10 MG tablet Take 10 mg by mouth daily.     Cholecalciferol (VITAMIN D) 125 MCG (5000 UT) CAPS      clotrimazole (LOTRIMIN) 1 % cream Apply topically 2 (two) times daily.     Cod Liver Oil 1000 MG CAPS Take by mouth.     Cranberry 1000 MG CAPS Take by mouth.     ELDERBERRY PO Take by mouth daily. (Patient not taking: Reported on 07/14/2021)     ezetimibe (ZETIA) 10 MG tablet Take 1 tablet (10 mg total) by mouth daily. 30 tablet 3   famotidine (PEPCID) 20 MG tablet Take 20 mg by mouth daily.     fluconazole (DIFLUCAN) 100 MG tablet Take 1 tablet (100 mg total) by mouth daily. (Patient not taking: Reported on 11/10/2021) 7 tablet 0   metoprolol tartrate (LOPRESSOR) 25 MG tablet Take 0.5 tablets (12.5 mg total) by mouth daily.     PARoxetine (PAXIL) 10 MG tablet Take 1 tablet (10 mg total) by mouth daily. (Patient not taking: Reported on 07/14/2021) 30 tablet 5   predniSONE (DELTASONE) 50 MG tablet Take 1 tab (50mg ) orally with breakfast for 5 days starting the morning after each Mogamulizumab treatment 30 tablet 0   Probiotic Product (PROBIOTIC DAILY PO) Take by mouth.     sodium chloride 0.9 % SOLN 250 mL with mogamulizumab-kpkc 20 MG/5ML SOLN 1 mg/kg Inject 1 mg/kg into the vein.     tacrolimus (PROTOPIC) 0.1 % ointment AAA BID to face PRN (Patient not taking: Reported on 11/10/2021)     triamcinolone cream (KENALOG) 0.1 %      No current facility-administered medications for this visit.    REVIEW OF SYSTEMS:   .10 Point review of Systems was done is negative except as noted above.    PHYSICAL EXAMINATION: ECOG PERFORMANCE STATUS: 1 - Symptomatic but completely ambulatory Vital signs reviewed stable . GENERAL:alert, in no acute distress and comfortable SKIN: no  acute rashes, no significant lesions EYES: conjunctiva are pink and non-injected, sclera anicteric OROPHARYNX: MMM, no exudates, no oropharyngeal erythema or ulceration NECK: supple, no JVD LYMPH:  no palpable lymphadenopathy in the cervical, axillary or inguinal regions LUNGS: clear to auscultation b/l with normal respiratory effort HEART: regular rate & rhythm ABDOMEN:  normoactive bowel sounds , non tender, not distended. Extremity: no pedal edema PSYCH: alert & oriented x 3 with  fluent speech NEURO: no focal motor/sensory deficits '      LABORATORY DATA:  I have reviewed the data as listed  . CBC Latest Ref Rng & Units 11/17/2021 10/20/2021 08/25/2021  WBC 4.0 - 10.5 K/uL 5.4 4.7 5.4  Hemoglobin 12.0 - 15.0 g/dL 14.4 13.8 14.0  Hematocrit 36.0 - 46.0 % 40.3 40.0 39.1  Platelets 150 - 400 K/uL 233 220 215    . CMP Latest Ref Rng & Units 11/17/2021 10/20/2021 08/25/2021  Glucose 70 - 99 mg/dL 100(H) 97 99  BUN 8 - 23 mg/dL 20 19 19   Creatinine 0.44 - 1.00 mg/dL 0.97 0.90 0.93  Sodium 135 - 145 mmol/L 138 140 140  Potassium 3.5 - 5.1 mmol/L 3.9 4.1 4.2  Chloride 98 - 111 mmol/L 104 108 107  CO2 22 - 32 mmol/L 26 23 23   Calcium 8.9 - 10.3 mg/dL 9.2 9.0 9.4  Total Protein 6.5 - 8.1 g/dL 7.4 7.3 7.4  Total Bilirubin 0.3 - 1.2 mg/dL 0.6 0.7 0.8  Alkaline Phos 38 - 126 U/L 81 81 79  AST 15 - 41 U/L 18 21 19   ALT 0 - 44 U/L 14 13 11    02/22/2021 Sezary Cell Analysis  9% Sezary-like cells present.   02/22/2021 Flow Cytometry  CD4+/CD7- cells are 21% of the total lymphoid gate. CD4+/CD26- cells are 22% of the total lymphoid gate. The CD4:CD8 ratio of the T-cells is 1.5:1. There is no evidence suggestive of T-cell clonality.  Sezary Cell Analysis Order: [id number] Component 05/24/2021  Sezary Cell  Of 100 lymphoid cells, 2 are counted as Sezary like cells.   Sezary Cells    Pathologist Signature  Lynnae Sandhoff, M.D.    09/14/2021 INTERPRETATION    Extracted DNA  ([id number]) from fresh skin tissue (T Cell Clonality (TCR Beta)):   Negative.    No monoclonal T-cell population detected by TCR beta chain PCR.     Flow Cytometry                                    Case: [id number]                                Authorizing Provider:  Renaldo Harrison, MD    Collected:           09/14/2021 1019              Ordering Location:     [facility]           Received:            09/14/2021 1308              Pathologist:           Sherian Maroon, MD                                                              Specimen:    Blood  DIAGNOSIS    A. Peripheral blood, flow cytometric immunophenotyping: 2% abnormal T-cell population with an atypical immunophenotype of CD2 dim+/ CD3+/ CD4+/ CD5 dim+/ CD7-/ CD8-/ CD26-.   Comment: The phenotype of the abnormal T cells matches that seen in the prior analyses and the clonal T-cell population is similar to that seen in the skin lesion, indicating the presence of circulating clonal "Sezary" cells.      RADIOGRAPHIC STUDIES: I have personally reviewed the radiological images as listed and agreed with the findings in the report. No results found.   ASSESSMENT & PLAN:   71 yo with   1) CTCL with previous treatment as noted above. Mycosis fungoides with Sezary syndrome 2) grade 1 through 2 skin reaction from mogamulizumab controlled previously with steroids. PLAN: -Discussed lab results today-CBC CMP and LDH stable. -Patient tolerated previous dose of mogamulizumab reasonably well with grade 1 skin reaction. -Continue mogamulizumab every month with same premedications, Solu-Medrol at 125 mg. -She will take her Zyrtec, Pepcid and prednisone for 5 days after each treatment.   -Continue follow-up with dermatology and Dr Lanier Ensign at [facility]: Switching mogamulizumab to 4 weeks with port flush and labs Next monthly treatment on  10/20/2021. Please cancel lab and mogamulizumab treatment schedule for 11/03/2021. Please schedule next mogamulizumab treatment 4 weeks after 10/20/2021 with port flush labs and MD visit  Follow-up as per neck scheduled treatment appointment on 12/15/2021  All of the patients questions were answered with apparent satisfaction. The patient knows to call the clinic with any problems, questions or concerns.  Sullivan Lone MD MS AAHIVMS [facility] Hematology/Oncology Physician [facility]  .

## 2021-11-25 ENCOUNTER — Encounter: Payer: Self-pay | Admitting: Hematology

## 2021-12-15 ENCOUNTER — Inpatient Hospital Stay: Payer: Medicare Other | Attending: Hematology

## 2021-12-15 ENCOUNTER — Other Ambulatory Visit: Payer: Self-pay

## 2021-12-15 ENCOUNTER — Inpatient Hospital Stay (HOSPITAL_BASED_OUTPATIENT_CLINIC_OR_DEPARTMENT_OTHER): Payer: Medicare Other | Admitting: Hematology

## 2021-12-15 ENCOUNTER — Inpatient Hospital Stay: Payer: Medicare Other

## 2021-12-15 VITALS — BP 146/90 | HR 59 | Temp 97.9°F | Resp 20 | Wt 142.2 lb

## 2021-12-15 DIAGNOSIS — Z86010 Personal history of colonic polyps: Secondary | ICD-10-CM | POA: Diagnosis not present

## 2021-12-15 DIAGNOSIS — C8590 Non-Hodgkin lymphoma, unspecified, unspecified site: Secondary | ICD-10-CM | POA: Diagnosis not present

## 2021-12-15 DIAGNOSIS — E785 Hyperlipidemia, unspecified: Secondary | ICD-10-CM | POA: Insufficient documentation

## 2021-12-15 DIAGNOSIS — Z79899 Other long term (current) drug therapy: Secondary | ICD-10-CM | POA: Insufficient documentation

## 2021-12-15 DIAGNOSIS — Z800 Family history of malignant neoplasm of digestive organs: Secondary | ICD-10-CM | POA: Diagnosis not present

## 2021-12-15 DIAGNOSIS — C8408 Mycosis fungoides, lymph nodes of multiple sites: Secondary | ICD-10-CM

## 2021-12-15 DIAGNOSIS — Z7189 Other specified counseling: Secondary | ICD-10-CM

## 2021-12-15 DIAGNOSIS — I10 Essential (primary) hypertension: Secondary | ICD-10-CM | POA: Insufficient documentation

## 2021-12-15 DIAGNOSIS — C8410 Sezary disease, unspecified site: Secondary | ICD-10-CM | POA: Insufficient documentation

## 2021-12-15 DIAGNOSIS — Z7951 Long term (current) use of inhaled steroids: Secondary | ICD-10-CM | POA: Insufficient documentation

## 2021-12-15 DIAGNOSIS — Z8042 Family history of malignant neoplasm of prostate: Secondary | ICD-10-CM | POA: Insufficient documentation

## 2021-12-15 DIAGNOSIS — M8580 Other specified disorders of bone density and structure, unspecified site: Secondary | ICD-10-CM | POA: Diagnosis not present

## 2021-12-15 DIAGNOSIS — Z5112 Encounter for antineoplastic immunotherapy: Secondary | ICD-10-CM | POA: Insufficient documentation

## 2021-12-15 DIAGNOSIS — K219 Gastro-esophageal reflux disease without esophagitis: Secondary | ICD-10-CM | POA: Insufficient documentation

## 2021-12-15 LAB — CBC WITH DIFFERENTIAL (CANCER CENTER ONLY)
Abs Immature Granulocytes: 0.01 10*3/uL (ref 0.00–0.07)
Basophils Absolute: 0 10*3/uL (ref 0.0–0.1)
Basophils Relative: 1 %
Eosinophils Absolute: 0.1 10*3/uL (ref 0.0–0.5)
Eosinophils Relative: 2 %
HCT: 40.3 % (ref 36.0–46.0)
Hemoglobin: 14.2 g/dL (ref 12.0–15.0)
Immature Granulocytes: 0 %
Lymphocytes Relative: 16 %
Lymphs Abs: 0.9 10*3/uL (ref 0.7–4.0)
MCH: 32.6 pg (ref 26.0–34.0)
MCHC: 35.2 g/dL (ref 30.0–36.0)
MCV: 92.4 fL (ref 80.0–100.0)
Monocytes Absolute: 0.4 10*3/uL (ref 0.1–1.0)
Monocytes Relative: 8 %
Neutro Abs: 3.9 10*3/uL (ref 1.7–7.7)
Neutrophils Relative %: 73 %
Platelet Count: 236 10*3/uL (ref 150–400)
RBC: 4.36 MIL/uL (ref 3.87–5.11)
RDW: 11.9 % (ref 11.5–15.5)
WBC Count: 5.4 10*3/uL (ref 4.0–10.5)
nRBC: 0 % (ref 0.0–0.2)

## 2021-12-15 LAB — CMP (CANCER CENTER ONLY)
ALT: 15 U/L (ref 0–44)
AST: 20 U/L (ref 15–41)
Albumin: 4.2 g/dL (ref 3.5–5.0)
Alkaline Phosphatase: 89 U/L (ref 38–126)
Anion gap: 7 (ref 5–15)
BUN: 16 mg/dL (ref 8–23)
CO2: 26 mmol/L (ref 22–32)
Calcium: 9.4 mg/dL (ref 8.9–10.3)
Chloride: 106 mmol/L (ref 98–111)
Creatinine: 0.9 mg/dL (ref 0.44–1.00)
GFR, Estimated: >60 mL/min (ref >60)
Glucose, Bld: 89 mg/dL (ref 70–99)
Potassium: 4 mmol/L (ref 3.5–5.1)
Sodium: 139 mmol/L (ref 135–145)
Total Bilirubin: 0.6 mg/dL (ref 0.3–1.2)
Total Protein: 7.3 g/dL (ref 6.5–8.1)

## 2021-12-15 LAB — LACTATE DEHYDROGENASE: LDH: 158 U/L (ref 98–192)

## 2021-12-15 MED ORDER — ACETAMINOPHEN 325 MG PO TABS
650.0000 mg | ORAL_TABLET | Freq: Once | ORAL | Status: AC
  Administered 2021-12-15: 650 mg via ORAL
  Filled 2021-12-15: qty 2

## 2021-12-15 MED ORDER — SODIUM CHLORIDE 0.9 % IV SOLN: INTRAVENOUS | Status: AC

## 2021-12-15 MED ORDER — SODIUM CHLORIDE 0.9 % IV SOLN
1.0000 mg/kg | Freq: Once | INTRAVENOUS | Status: AC
  Administered 2021-12-15: 60 mg via INTRAVENOUS
  Filled 2021-12-15: qty 15

## 2021-12-15 MED ORDER — FAMOTIDINE IN NACL 20-0.9 MG/50ML-% IV SOLN
20.0000 mg | Freq: Once | INTRAVENOUS | Status: AC
  Administered 2021-12-15: 20 mg via INTRAVENOUS
  Filled 2021-12-15: qty 50

## 2021-12-15 MED ORDER — METHYLPREDNISOLONE SODIUM SUCC 125 MG IJ SOLR
125.0000 mg | Freq: Once | INTRAMUSCULAR | Status: AC
  Administered 2021-12-15: 125 mg via INTRAVENOUS
  Filled 2021-12-15: qty 2

## 2021-12-15 MED ORDER — DIPHENHYDRAMINE HCL 25 MG PO CAPS
25.0000 mg | ORAL_CAPSULE | Freq: Once | ORAL | Status: AC
  Administered 2021-12-15: 25 mg via ORAL
  Filled 2021-12-15: qty 1

## 2021-12-16 ENCOUNTER — Telehealth: Payer: Self-pay | Admitting: Hematology

## 2021-12-16 NOTE — Telephone Encounter (Signed)
Scheduled follow-up appointment per 2/8 los. Patient is aware. 

## 2021-12-21 ENCOUNTER — Encounter: Payer: Self-pay | Admitting: Hematology

## 2021-12-21 NOTE — Progress Notes (Signed)
HEMATOLOGY/ONCOLOGY CLINIC NOTE  Date of Service: .12/15/2021   Patient Care Team: Chesley Noon, MD as PCP - General (Family Medicine) Cantwell, Gerline Legacy, PA-C (Cardiology)  CHIEF COMPLAINTS/PURPOSE OF CONSULT Follow-up for continued management of mycosis fungoides/Sezary syndrome.  HISTORY OF PRESENTING ILLNESS:  Please see previous notes for details on initial presentation  Alexa [name] is here for continued evaluation and management of her mycosis fungoides/Sezary syndrome. She notes no acute new symptoms since her last clinic visit.  Notes that she is having less cutaneous hypersensitivity flare since being on the steroid burst after each treatment with mogamulizumab. No fevers no chills no night sweats no unexpected weight loss. No new lumps or bumps. No other acute new focal symptoms or significant new skin rashes.  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[place])    Osteopenia    bilateral hips   Rectal cancer ([place]) 04/2017    SURGICAL HISTORY: Past Surgical History:  Procedure Laterality Date   BREAST BIOPSY     COLONOSCOPY  03/07/2012   with biopsy   DILATATION & CURRETTAGE/HYSTEROSCOPY WITH RESECTOCOPE N/A 06/02/2014   Procedure: DILATATION & CURETTAGE/HYSTEROSCOPY WITH RESECTOCOPE;  Surgeon: Lyman Speller, MD;  Location: [facility];  Service: Gynecology;  Laterality: N/A;   FOOT SURGERY     Right    TRANSANAL EXCISION OF RECTAL MASS N/A 06/09/2017   Procedure: TRANSANAL EXCISION OF RECTAL MASS;   Surgeon: [name], MD;  Location: [facility];  Service: General;  Laterality: N/A;    SOCIAL HISTORY: Social History   Socioeconomic History   Marital status: Single    Spouse name: Not on file   Number of children: 0   Years of education: Not on file   Highest education level: Not on file  Occupational History   Not on file  Tobacco Use   Smoking status: Never   Smokeless tobacco: Never  Vaping Use   Vaping Use: Never used  Substance and Sexual Activity   Alcohol use: Yes    Alcohol/week: 0.0 - 1.0 standard drinks   Drug use: No   Sexual activity: Not Currently    Partners: Male    Birth control/protection: Post-menopausal  Other Topics Concern   Not on file  Social History Narrative   Not on file   Social Determinants of Health   Financial Resource Strain: Not on file  Food Insecurity: Not on file  Transportation Needs: Not on file  Physical Activity: Not on file  Stress: Not on file  Social Connections: Not on file  Intimate Partner Violence: Not on file    FAMILY HISTORY: Family History  Problem Relation Age of Onset   Anuerysm Father    Heart disease Father    Diabetes Father    Cancer Father        unknown type   Hypertension Father    Heart attack Mother    Heart disease Mother    Kidney disease Mother  Kidney cancer Mother        contained tumor   Hypertension Mother    Heart attack Brother    Cancer Brother        liver cancer   Melanoma Brother    Other Brother        Bypass surgery    Hypertension Sister    Ovarian cancer Paternal Grandmother        was PMP   Colon cancer Neg Hx     ALLERGIES:  is allergic to ciprofloxacin, diphenhydramine hcl, and sudafed [pseudoephedrine hcl].  MEDICATIONS:  Current Outpatient Medications  Medication Sig Dispense Refill   amLODipine (NORVASC) 5 MG tablet Take 1 tablet (5 mg total) by mouth daily. 90 tablet 3   B Complex Vitamins (VITAMIN B COMPLEX) TABS Take by mouth. 5000  UT     Biotin 5000 MCG CAPS      cetirizine (ZYRTEC) 10 MG tablet Take 10 mg by mouth daily.     Cholecalciferol (VITAMIN D) 125 MCG (5000 UT) CAPS      clotrimazole (LOTRIMIN) 1 % cream Apply topically 2 (two) times daily.     Cod Liver Oil 1000 MG CAPS Take by mouth.     Cranberry 1000 MG CAPS Take by mouth.     ELDERBERRY PO Take by mouth daily.     ezetimibe (ZETIA) 10 MG tablet Take 1 tablet (10 mg total) by mouth daily. 30 tablet 3   famotidine (PEPCID) 20 MG tablet Take 20 mg by mouth daily.     metoprolol tartrate (LOPRESSOR) 25 MG tablet Take 0.5 tablets (12.5 mg total) by mouth daily.     predniSONE (DELTASONE) 50 MG tablet Take 1 tab (50mg ) orally with breakfast for 5 days starting the morning after each Mogamulizumab treatment 30 tablet 0   Probiotic Product (PROBIOTIC DAILY PO) Take by mouth.     sodium chloride 0.9 % SOLN 250 mL with mogamulizumab-kpkc 20 MG/5ML SOLN 1 mg/kg Inject 1 mg/kg into the vein.     triamcinolone cream (KENALOG) 0.1 %      fluconazole (DIFLUCAN) 100 MG tablet Take 1 tablet (100 mg total) by mouth daily. (Patient not taking: Reported on 11/10/2021) 7 tablet 0   PARoxetine (PAXIL) 10 MG tablet Take 1 tablet (10 mg total) by mouth daily. (Patient not taking: Reported on 07/14/2021) 30 tablet 5   tacrolimus (PROTOPIC) 0.1 % ointment AAA BID to face PRN (Patient not taking: Reported on 12/15/2021)     No current facility-administered medications for this visit.    REVIEW OF SYSTEMS:   .10 Point review of Systems was done is negative except as noted above.     PHYSICAL EXAMINATION: ECOG PERFORMANCE STATUS: 1 - Symptomatic but completely ambulatory .BP (!) 146/90    Pulse (!) 59    Temp 97.9 F (36.6 C)    Resp 20    Wt 142 lb 3.2 oz (64.5 kg)    LMP 11/07/2002    SpO2 98%    BMI 25.59 kg/m  . GENERAL:alert, in no acute distress and comfortable SKIN: no acute rashes, no significant lesions EYES: conjunctiva are pink and non-injected, sclera  anicteric OROPHARYNX: MMM, no exudates, no oropharyngeal erythema or ulceration NECK: supple, no JVD LYMPH:  no palpable lymphadenopathy in the cervical, axillary or inguinal regions LUNGS: clear to auscultation b/l with normal respiratory effort HEART: regular rate & rhythm ABDOMEN:  normoactive bowel sounds , non tender, not distended. Extremity: no pedal edema PSYCH: alert &  oriented x 3 with fluent speech NEURO: no focal motor/sensory deficits   LABORATORY DATA:  I have reviewed the data as listed  . CBC Latest Ref Rng & Units 12/15/2021 11/17/2021 10/20/2021  WBC 4.0 - 10.5 K/uL 5.4 5.4 4.7  Hemoglobin 12.0 - 15.0 g/dL 14.2 14.4 13.8  Hematocrit 36.0 - 46.0 % 40.3 40.3 40.0  Platelets 150 - 400 K/uL 236 233 220    . CMP Latest Ref Rng & Units 12/15/2021 11/17/2021 10/20/2021  Glucose 70 - 99 mg/dL 89 100(H) 97  BUN 8 - 23 mg/dL 16 20 19   Creatinine 0.44 - 1.00 mg/dL 0.90 0.97 0.90  Sodium 135 - 145 mmol/L 139 138 140  Potassium 3.5 - 5.1 mmol/L 4.0 3.9 4.1  Chloride 98 - 111 mmol/L 106 104 108  CO2 22 - 32 mmol/L 26 26 23   Calcium 8.9 - 10.3 mg/dL 9.4 9.2 9.0  Total Protein 6.5 - 8.1 g/dL 7.3 7.4 7.3  Total Bilirubin 0.3 - 1.2 mg/dL 0.6 0.6 0.7  Alkaline Phos 38 - 126 U/L 89 81 81  AST 15 - 41 U/L 20 18 21   ALT 0 - 44 U/L 15 14 13    02/22/2021 Sezary Cell Analysis  9% Sezary-like cells present.   02/22/2021 Flow Cytometry  CD4+/CD7- cells are 21% of the total lymphoid gate. CD4+/CD26- cells are 22% of the total lymphoid gate. The CD4:CD8 ratio of the T-cells is 1.5:1. There is no evidence suggestive of T-cell clonality.  Sezary Cell Analysis Order: [id number] Component 05/24/2021  Sezary Cell  Of 100 lymphoid cells, 2 are counted as Sezary like cells.   Sezary Cells    Pathologist Signature  Lynnae Sandhoff, M.D.    09/14/2021 INTERPRETATION    Extracted DNA ([id number]) from fresh skin tissue (T Cell Clonality (TCR Beta)):   Negative.    No monoclonal T-cell  population detected by TCR beta chain PCR.     Flow Cytometry                                    Case: [id number]                                Authorizing Provider:  Renaldo Harrison, MD    Collected:           09/14/2021 1019              Ordering Location:     [facility]           Received:            09/14/2021 1308              Pathologist:           Sherian Maroon, MD                                                              Specimen:    Blood  DIAGNOSIS    A. Peripheral blood, flow cytometric immunophenotyping: 2% abnormal T-cell population with an atypical immunophenotype of CD2 dim+/ CD3+/ CD4+/ CD5 dim+/ CD7-/ CD8-/ CD26-.   Comment: The phenotype of the abnormal T cells matches that seen in the prior analyses and the clonal T-cell population is similar to that seen in the skin lesion, indicating the presence of circulating clonal "Sezary" cells.      RADIOGRAPHIC STUDIES: I have personally reviewed the radiological images as listed and agreed with the findings in the report. No results found.   ASSESSMENT & PLAN:   71 yo with   1) CTCL with previous treatment as noted above. Mycosis fungoides with Sezary syndrome 2) grade 1 through 2 skin reaction from mogamulizumab controlled previously with steroids. PLAN: -Discussed patient's lab results CBC, CMP and LDH are within normal limits -Patient has no overt new hypersensitivity reaction or notable toxicity from her last dose of mogamulizumab. -Patient has minimal hypersensitivity reaction which is well controlled with her Zyrtec Pepcid and prednisone for 5 days after each treatment. -Continue monthly mogamulizumab with current supportive medications. -Continue following up with Dr. Lanier Ensign and Dr. Irish Elders at [facility] -She is continued to be on topical steroids and bluelight therapy.  FOLLOW UP: Please schedule cycle 12 of mogamulizumab with labs  and NP visit with Murray Hodgkins in 4 weeks Plz schedule cycle 13 of mogamulizumab with labs and MD visit with Dr. Irene Limbo in 8 weeks   All of the patients questions were answered with apparent satisfaction. The patient knows to call the clinic with any problems, questions or concerns.  . The total time spent in the appointment was 47mins*  Alexa Zipp MD [place] AAHIVMS [facility] Hematology/Oncology Physician [facility]  .*Total Encounter Time as defined by the Centers for Medicare and Medicaid Services includes, in addition to the face-to-face time of a patient visit (documented in the note above) non-face-to-face time: obtaining and reviewing outside history, ordering and reviewing medications, tests or procedures, care coordination (communications with other health care professionals or caregivers) and documentation in the medical record.

## 2021-12-30 ENCOUNTER — Telehealth: Payer: Self-pay | Admitting: Student

## 2022-01-10 ENCOUNTER — Encounter: Payer: Self-pay | Admitting: Hematology

## 2022-01-12 ENCOUNTER — Inpatient Hospital Stay: Payer: Medicare Other

## 2022-01-12 ENCOUNTER — Other Ambulatory Visit: Payer: Self-pay

## 2022-01-12 ENCOUNTER — Inpatient Hospital Stay (HOSPITAL_BASED_OUTPATIENT_CLINIC_OR_DEPARTMENT_OTHER): Payer: Medicare Other | Admitting: Hematology

## 2022-01-12 ENCOUNTER — Inpatient Hospital Stay: Payer: Medicare Other | Attending: Hematology

## 2022-01-12 VITALS — BP 121/76 | HR 59 | Temp 97.8°F | Resp 17 | Ht 62.5 in | Wt 142.9 lb

## 2022-01-12 DIAGNOSIS — E785 Hyperlipidemia, unspecified: Secondary | ICD-10-CM | POA: Diagnosis not present

## 2022-01-12 DIAGNOSIS — Z85048 Personal history of other malignant neoplasm of rectum, rectosigmoid junction, and anus: Secondary | ICD-10-CM | POA: Diagnosis not present

## 2022-01-12 DIAGNOSIS — Z7189 Other specified counseling: Secondary | ICD-10-CM

## 2022-01-12 DIAGNOSIS — I10 Essential (primary) hypertension: Secondary | ICD-10-CM | POA: Insufficient documentation

## 2022-01-12 DIAGNOSIS — C8408 Mycosis fungoides, lymph nodes of multiple sites: Secondary | ICD-10-CM

## 2022-01-12 DIAGNOSIS — M8580 Other specified disorders of bone density and structure, unspecified site: Secondary | ICD-10-CM | POA: Insufficient documentation

## 2022-01-12 DIAGNOSIS — Z8051 Family history of malignant neoplasm of kidney: Secondary | ICD-10-CM | POA: Diagnosis not present

## 2022-01-12 DIAGNOSIS — C8400 Mycosis fungoides, unspecified site: Secondary | ICD-10-CM | POA: Insufficient documentation

## 2022-01-12 DIAGNOSIS — K219 Gastro-esophageal reflux disease without esophagitis: Secondary | ICD-10-CM | POA: Insufficient documentation

## 2022-01-12 DIAGNOSIS — Z79899 Other long term (current) drug therapy: Secondary | ICD-10-CM | POA: Insufficient documentation

## 2022-01-12 DIAGNOSIS — C8410 Sezary disease, unspecified site: Secondary | ICD-10-CM | POA: Diagnosis not present

## 2022-01-12 DIAGNOSIS — C8590 Non-Hodgkin lymphoma, unspecified, unspecified site: Secondary | ICD-10-CM

## 2022-01-12 DIAGNOSIS — Z5111 Encounter for antineoplastic chemotherapy: Secondary | ICD-10-CM

## 2022-01-12 DIAGNOSIS — Z801 Family history of malignant neoplasm of trachea, bronchus and lung: Secondary | ICD-10-CM | POA: Insufficient documentation

## 2022-01-12 DIAGNOSIS — Z5112 Encounter for antineoplastic immunotherapy: Secondary | ICD-10-CM | POA: Insufficient documentation

## 2022-01-12 DIAGNOSIS — Z8041 Family history of malignant neoplasm of ovary: Secondary | ICD-10-CM | POA: Diagnosis not present

## 2022-01-12 LAB — CBC WITH DIFFERENTIAL (CANCER CENTER ONLY)
Abs Immature Granulocytes: 0.02 10*3/uL (ref 0.00–0.07)
Basophils Absolute: 0 10*3/uL (ref 0.0–0.1)
Basophils Relative: 1 %
Eosinophils Absolute: 0.2 10*3/uL (ref 0.0–0.5)
Eosinophils Relative: 4 %
HCT: 40.1 % (ref 36.0–46.0)
Hemoglobin: 13.9 g/dL (ref 12.0–15.0)
Immature Granulocytes: 0 %
Lymphocytes Relative: 20 %
Lymphs Abs: 0.9 10*3/uL (ref 0.7–4.0)
MCH: 32.6 pg (ref 26.0–34.0)
MCHC: 34.7 g/dL (ref 30.0–36.0)
MCV: 93.9 fL (ref 80.0–100.0)
Monocytes Absolute: 0.4 10*3/uL (ref 0.1–1.0)
Monocytes Relative: 9 %
Neutro Abs: 3 10*3/uL (ref 1.7–7.7)
Neutrophils Relative %: 66 %
Platelet Count: 246 10*3/uL (ref 150–400)
RBC: 4.27 MIL/uL (ref 3.87–5.11)
RDW: 12.2 % (ref 11.5–15.5)
WBC Count: 4.5 10*3/uL (ref 4.0–10.5)
nRBC: 0 % (ref 0.0–0.2)

## 2022-01-12 LAB — CMP (CANCER CENTER ONLY)
ALT: 14 U/L (ref 0–44)
AST: 19 U/L (ref 15–41)
Albumin: 4.3 g/dL (ref 3.5–5.0)
Alkaline Phosphatase: 86 U/L (ref 38–126)
Anion gap: 6 (ref 5–15)
BUN: 16 mg/dL (ref 8–23)
CO2: 28 mmol/L (ref 22–32)
Calcium: 9.6 mg/dL (ref 8.9–10.3)
Chloride: 106 mmol/L (ref 98–111)
Creatinine: 0.84 mg/dL (ref 0.44–1.00)
GFR, Estimated: >60 mL/min (ref >60)
Glucose, Bld: 96 mg/dL (ref 70–99)
Potassium: 4.1 mmol/L (ref 3.5–5.1)
Sodium: 140 mmol/L (ref 135–145)
Total Bilirubin: 0.7 mg/dL (ref 0.3–1.2)
Total Protein: 7.1 g/dL (ref 6.5–8.1)

## 2022-01-12 LAB — LACTATE DEHYDROGENASE: LDH: 153 U/L (ref 98–192)

## 2022-01-12 MED ORDER — SODIUM CHLORIDE 0.9 % IV SOLN
1.0000 mg/kg | Freq: Once | INTRAVENOUS | Status: AC
  Administered 2022-01-12: 60 mg via INTRAVENOUS
  Filled 2022-01-12: qty 15

## 2022-01-12 MED ORDER — FAMOTIDINE IN NACL 20-0.9 MG/50ML-% IV SOLN
20.0000 mg | Freq: Once | INTRAVENOUS | Status: AC
  Administered 2022-01-12: 20 mg via INTRAVENOUS
  Filled 2022-01-12: qty 50

## 2022-01-12 MED ORDER — SODIUM CHLORIDE 0.9 % IV SOLN: Freq: Once | INTRAVENOUS | Status: AC

## 2022-01-12 MED ORDER — DIPHENHYDRAMINE HCL 25 MG PO CAPS
25.0000 mg | ORAL_CAPSULE | Freq: Once | ORAL | Status: AC
  Administered 2022-01-12: 25 mg via ORAL
  Filled 2022-01-12: qty 1

## 2022-01-12 MED ORDER — METHYLPREDNISOLONE SODIUM SUCC 125 MG IJ SOLR
125.0000 mg | Freq: Once | INTRAMUSCULAR | Status: AC
  Administered 2022-01-12: 125 mg via INTRAVENOUS
  Filled 2022-01-12: qty 2

## 2022-01-12 MED ORDER — ACETAMINOPHEN 325 MG PO TABS
650.0000 mg | ORAL_TABLET | Freq: Once | ORAL | Status: AC
  Administered 2022-01-12: 650 mg via ORAL
  Filled 2022-01-12: qty 2

## 2022-01-12 NOTE — Patient Instructions (Signed)
[facility]   ?Discharge Instructions: ?Thank you for choosing [facility] to provide your oncology and hematology care.  ? ?If you have a lab appointment with the [facility], please go directly to the [facility] and check in at the registration area. ?  ?Wear comfortable clothing and clothing appropriate for easy access to any Portacath or PICC line.  ? ?We strive to give you quality time with your provider. You may need to reschedule your appointment if you arrive late (15 or more minutes).  Arriving late affects you and other patients whose appointments are after yours.  Also, if you miss three or more appointments without notifying the office, you may be dismissed from the clinic at the provider?s discretion.    ?  ?For prescription refill requests, have your pharmacy contact our office and allow 72 hours for refills to be completed.   ? ?Today you received the following chemotherapy and/or immunotherapy agents: Mogamulizumab     ?  ?To help prevent nausea and vomiting after your treatment, we encourage you to take your nausea medication as directed. ? ?BELOW ARE SYMPTOMS THAT SHOULD BE REPORTED IMMEDIATELY: ?*FEVER GREATER THAN 100.4 F (38 ?C) OR HIGHER ?*CHILLS OR SWEATING ?*NAUSEA AND VOMITING THAT IS NOT CONTROLLED WITH YOUR NAUSEA MEDICATION ?*UNUSUAL SHORTNESS OF BREATH ?*UNUSUAL BRUISING OR BLEEDING ?*URINARY PROBLEMS (pain or burning when urinating, or frequent urination) ?*BOWEL PROBLEMS (unusual diarrhea, constipation, pain near the anus) ?TENDERNESS IN MOUTH AND THROAT WITH OR WITHOUT PRESENCE OF ULCERS (sore throat, sores in mouth, or a toothache) ?UNUSUAL RASH, SWELLING OR PAIN  ?UNUSUAL VAGINAL DISCHARGE OR ITCHING  ? ?Items with * indicate a potential emergency and should be followed up as soon as possible or go to the Emergency Department if any problems should occur. ? ?Please show the CHEMOTHERAPY ALERT CARD or IMMUNOTHERAPY ALERT CARD at  check-in to the Emergency Department and triage nurse. ? ?Should you have questions after your visit or need to cancel or reschedule your appointment, please contact [facility]  Dept: [phone]  and follow the prompts.  Office hours are 8:00 a.m. to 4:30 p.m. Monday - Friday. Please note that voicemails left after 4:00 p.m. may not be returned until the following business day.  We are closed weekends and major holidays. You have access to a nurse at all times for urgent questions. Please call the main number to the clinic Dept: [phone] and follow the prompts. ? ? ?For any non-urgent questions, you may also contact your provider using MyChart. We now offer e-Visits for anyone 25 and older to request care online for non-urgent symptoms. For details visit [email or web address]. ?  ?Also download the MyChart app! Go to the app store, search "MyChart", open the app, select [facility], and log in with your MyChart username and password. ? ?Due to Covid, a mask is required upon entering the hospital/clinic. If you do not have a mask, one will be given to you upon arrival. For doctor visits, patients may have 1 support Providencia Hottenstein aged 15 or older with them. For treatment visits, patients cannot have anyone with them due to current Covid guidelines and our immunocompromised population.  ? ?

## 2022-01-18 ENCOUNTER — Encounter: Payer: Self-pay | Admitting: Hematology

## 2022-01-18 NOTE — Progress Notes (Signed)
? ? ?HEMATOLOGY/ONCOLOGY CLINIC NOTE ? ?Date of Service: .01/12/2022 ? ? ?Patient Care Team: ?Chesley Noon, MD as PCP - General (Family Medicine) ?Cantwell, Gerline Legacy, PA-C (Cardiology) ? 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 ? ?HISTORY OF PRESENTING ILLNESS:  ?Please see previous notes for details on initial presentation ? ?INTERVAL HISTORY ? ?[place] patient for continued evaluation and management of her mycosis fungoides/Sezary syndrome. ?She notes no acute toxicities from her previous mogamulizumab treatment. ?She notes that her current supportive medications and posttreatment prednisone is helping her control any hypersensitivity reactions. ?No fevers no chills no night sweats. ?No new lumps or bumps. ?Interval labs at [facility] were stable with regards to flow cytometry and Sezary cell analysis. ?She would like to continue monthly mogamulizumab. ? ?Labs done today were reviewed with her in detail. ? ?MEDICAL HISTORY:  ?Past Medical History:  ?Diagnosis Date  ? Adenomatous polyp   ? Cancer [facility])   ? 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 Family history of anesthesia complication   ? sister and sister's children postitive for MH!!!!!!, pt tested negative 3-52yr ago  ? GERD (gastroesophageal reflux disease)   ? History of hypertension   ? off medication after weight loss  ? Hyperlipemia   ? [place]   ? light box therapy  ? 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[place]   ? Osteopenia   ? bilateral hips  ? [place]al [place] 04[place]  ? ? ?SURGICAL HISTORY: ?Past Surgical History:  ?Procedure Laterality Date  ? BREAST BIOPSY    ? COLONOSCOPY  03/07/2012  ? with biopsy  ? DILATATION & CURRETTAGE/HYSTEROSCOPY WITH RESECTOCOPE N/A 06/02/2014  ? [place]  Surgeon: MLyman Speller MD;  [facility];  Service: Gynecology;  Laterality: N/A;  ? FOOT  SURGERY    ? Right   ? TRANSANAL EXCISION OF RECTAL MASS N/A 06/09/2017  ? Procedure: TRANSANAL EXCISION OF RECTAL MASS;  Surgeon: TLeighton Ruff MD;  [facility]  Service: General;  Laterality: N/A;  ? ? ?SOCIAL HISTORY: ?Social History  ? ?Socioeconomic History  ? Marital status: Single  ?  Spouse name: Not on file  ? Number of children: 0  ? Years of education: Not on file  ? Highest education level: Not on file  ?Occupational History  ? Not on file  ?Tobacco Use  ? Smoking status: Never  ? Smokeless tobacco: Never  ?Vaping Use  ? Vaping Use: Never used  ?Substance and Sexual Activity  ? Alcohol use: Yes  ?  Alcohol/week: 0.0 - 1.0 standard drinks  ? Drug use: No  ? Sexual activity: Not Currently  ?  Partners: Male  ?  Birth control/protection: Post-menopausal  ?Other Topics Concern  ? Not on file  ?Social History Narrative  ? Not on file  ? ?Social Determinants of Health  ? ?Financial Resource Strain: Not on file  ?Food Insecurity: Not on file  ?Transportation Needs: Not on file  ?Physical Activity: Not on file  ?Stress: Not on file  ?Social Connections: Not on file  ?Intimate Partner Violence: Not on file  ? ? ?FAMILY HISTORY: ?Family History  ?Problem Relation Age of Onset  ? Anuerysm Father   ? Heart disease Father   ? Diabetes Father   ? Cancer Father   ?     unknown type  ? Hypertension Father   ? Heart attack Mother   ?  Heart disease Mother   ? Kidney disease Mother   ? Kidney cancer Mother   ?     contained tumor  ? Hypertension Mother   ? Heart attack Brother   ? Cancer Brother   ?     liver cancer  ? Melanoma Brother   ? Other Brother   ?     Bypass surgery   ? Hypertension Sister   ? Ovarian cancer Paternal Grandmother   ?     was PMP  ? Colon cancer Neg Hx   ? ? ?ALLERGIES:  is allergic to ciprofloxacin, diphenhydramine hcl, and sudafed [pseudoephedrine hcl]. ? ?MEDICATIONS:  ?Current Outpatient Medications  ?Medication Sig Dispense Refill  ? amLODipine (NORVASC) 5 MG tablet  Take 1 tablet (5 mg total) by mouth daily. 90 tablet 3  ? B Complex Vitamins (VITAMIN B COMPLEX) TABS Take by mouth. 5000 UT    ? Biotin 5000 MCG CAPS     ? cetirizine (ZYRTEC) 10 MG tablet Take 10 mg by mouth daily.    ? Cholecalciferol (VITAMIN D) 125 MCG (5000 UT) CAPS     ? clotrimazole (LOTRIMIN) 1 % cream Apply topically 2 (two) times daily.    ? Cod Liver Oil 1000 MG CAPS Take by mouth.    ? Cranberry 1000 MG CAPS Take by mouth.    ? ELDERBERRY PO Take by mouth daily.    ? ezetimibe (ZETIA) 10 MG tablet Take 1 tablet (10 mg total) by mouth daily. 30 tablet 3  ? famotidine (PEPCID) 20 MG tablet Take 20 mg by mouth daily.    ? fluconazole (DIFLUCAN) 100 MG tablet Take 1 tablet (100 mg total) by mouth daily. (Patient not taking: Reported on 11/10/2021) 7 tablet 0  ? metoprolol tartrate (LOPRESSOR) 25 MG tablet Take 0.5 tablets (12.5 mg total) by mouth daily.    ? PARoxetine (PAXIL) 10 MG tablet Take 1 tablet (10 mg total) by mouth daily. (Patient not taking: Reported on 07/14/2021) 30 tablet 5  ? predniSONE (DELTASONE) 50 MG tablet Take 1 tab ('50mg'$ ) orally with breakfast for 5 days starting the morning after each Mogamulizumab treatment 30 tablet 0  ? Probiotic Product (PROBIOTIC DAILY PO) Take by mouth.    ? sodium chloride 0.9 % SOLN 250 mL with mogamulizumab-kpkc 20 MG/5ML SOLN 1 mg/kg Inject 1 mg/kg into the vein.    ? tacrolimus (PROTOPIC) 0.1 % ointment AAA BID to face PRN (Patient not taking: Reported on 12/15/2021)    ? triamcinolone cream (KENALOG) 0.1 %     ? ?No current facility-administered medications for this visit.  ? ? ?REVIEW OF SYSTEMS:   ?10 Point review of Systems was done is negative except as noted above. ? ?PHYSICAL EXAMINATION: ?ECOG PERFORMANCE STATUS: 1 - Symptomatic but completely ambulatory ?.BP 121/76 (BP Location: Left Arm, Patient Position: Sitting)   Pulse (!) 59   Temp 97.8 ?F (36.6 ?C) (Temporal)   Resp 17   Ht 5' 2.5" (1.588 m)   Wt 142 lb 14.4 oz (64.8 kg)   LMP 11/07/2002    SpO2 99%   BMI 25.72 kg/m?  ?NAD ?GENERAL:alert, in no acute distress and comfortable ?SKIN: no acute rashes, no significant lesions ?EYES: conjunctiva are pink and non-injected, sclera anicteric ?OROPHARYNX: MMM, no exudates, no oropharyngeal erythema or ulceration ?NECK: supple, no JVD ?LYMPH:  no palpable lymphadenopathy in the cervical, axillary or inguinal regions ?LUNGS: clear to auscultation b/l with normal respiratory effort ?HEART: regular rate & rhythm ?ABDOMEN:  normoactive bowel sounds , non tender, not distended. ?Extremity: no pedal edema ?PSYCH: alert & oriented x 3 with fluent speech ?NEURO: no focal motor/sensory deficits ? ? ?LABORATORY DATA:  ?I have reviewed the data as listed ? ?. ?CBC Latest Ref Rng & Units 01/12/2022 12/15/2021 11/17/2021  ?WBC 4.0 - 10.5 K/uL 4.5 5.4 5.4  ?Hemoglobin 12.0 - 15.0 g/dL 13.9 14.2 14.4  ?Hematocrit 36.0 - 46.0 % 40.1 40.3 40.3  ?Platelets 150 - 400 K/uL 246 236 233  ? ? ?. ?CMP Latest Ref Rng & Units 01/12/2022 12/15/2021 11/17/2021  ?Glucose 70 - 99 mg/dL 96 89 100(H)  ?BUN 8 - 23 mg/dL '16 16 20  '$ ?Creatinine 0.44 - 1.00 mg/dL 0.84 0.90 0.97  ?Sodium 135 - 145 mmol/L 140 139 138  ?Potassium 3.5 - 5.1 mmol/L 4.1 4.0 3.9  ?Chloride 98 - 111 mmol/L 106 106 104  ?CO2 22 - 32 mmol/L '28 26 26  '$ ?Calcium 8.9 - 10.3 mg/dL 9.6 9.4 9.2  ?Total Protein 6.5 - 8.1 g/dL 7.1 7.3 7.4  ?Total Bilirubin 0.3 - 1.2 mg/dL 0.7 0.6 0.6  ?Alkaline Phos 38 - 126 U/L 86 89 81  ?AST 15 - 41 U/L '19 20 18  '$ ?ALT 0 - 44 U/L '14 15 14  '$ ? ?. ?Lab Results  ?Component Value Date  ? LDH 153 01/12/2022  ? ? ? ?RADIOGRAPHIC STUDIES: ?I have personally reviewed the radiological images as listed and agreed with the findings in the report. ?No results found. ? ? ?ASSESSMENT & PLAN:  ? ?71 yo with  ? ?1) CTCL with previous treatment as noted above. Mycosis fungoides with Sezary syndrome ?2) grade 1 through 2 skin reaction from mogamulizumab controlled with steroids. ?PLAN: ?-Patient's labs were discussed with her  in detail ?CBC, CMP, LDH within normal limits ?-Recent outside labs at [facility] did not show any signs of progression of patient's Sezary syndrome. ?-No significant new skin lesions to suggest progression of myco

## 2022-01-19 ENCOUNTER — Ambulatory Visit: Payer: Medicare Other | Admitting: Sports Medicine

## 2022-01-27 ENCOUNTER — Encounter: Payer: Medicare Other | Admitting: Sports Medicine

## 2022-02-08 ENCOUNTER — Other Ambulatory Visit: Payer: Self-pay

## 2022-02-08 DIAGNOSIS — C8590 Non-Hodgkin lymphoma, unspecified, unspecified site: Secondary | ICD-10-CM

## 2022-02-09 ENCOUNTER — Inpatient Hospital Stay (HOSPITAL_BASED_OUTPATIENT_CLINIC_OR_DEPARTMENT_OTHER): Payer: Medicare Other | Admitting: Hematology

## 2022-02-09 ENCOUNTER — Inpatient Hospital Stay: Payer: Medicare Other | Attending: Hematology

## 2022-02-09 ENCOUNTER — Inpatient Hospital Stay: Payer: Medicare Other

## 2022-02-09 ENCOUNTER — Other Ambulatory Visit: Payer: Self-pay

## 2022-02-09 VITALS — BP 138/75 | HR 71 | Temp 97.7°F | Resp 20 | Wt 142.1 lb

## 2022-02-09 DIAGNOSIS — Z800 Family history of malignant neoplasm of digestive organs: Secondary | ICD-10-CM | POA: Insufficient documentation

## 2022-02-09 DIAGNOSIS — E785 Hyperlipidemia, unspecified: Secondary | ICD-10-CM | POA: Diagnosis not present

## 2022-02-09 DIAGNOSIS — Z7952 Long term (current) use of systemic steroids: Secondary | ICD-10-CM | POA: Insufficient documentation

## 2022-02-09 DIAGNOSIS — Z5112 Encounter for antineoplastic immunotherapy: Secondary | ICD-10-CM | POA: Insufficient documentation

## 2022-02-09 DIAGNOSIS — C8590 Non-Hodgkin lymphoma, unspecified, unspecified site: Secondary | ICD-10-CM

## 2022-02-09 DIAGNOSIS — Z7189 Other specified counseling: Secondary | ICD-10-CM

## 2022-02-09 DIAGNOSIS — Z85048 Personal history of other malignant neoplasm of rectum, rectosigmoid junction, and anus: Secondary | ICD-10-CM | POA: Diagnosis not present

## 2022-02-09 DIAGNOSIS — M8580 Other specified disorders of bone density and structure, unspecified site: Secondary | ICD-10-CM | POA: Insufficient documentation

## 2022-02-09 DIAGNOSIS — Z79899 Other long term (current) drug therapy: Secondary | ICD-10-CM | POA: Insufficient documentation

## 2022-02-09 DIAGNOSIS — Z5111 Encounter for antineoplastic chemotherapy: Secondary | ICD-10-CM | POA: Diagnosis not present

## 2022-02-09 DIAGNOSIS — K219 Gastro-esophageal reflux disease without esophagitis: Secondary | ICD-10-CM | POA: Insufficient documentation

## 2022-02-09 DIAGNOSIS — C8410 Sezary disease, unspecified site: Secondary | ICD-10-CM | POA: Diagnosis not present

## 2022-02-09 DIAGNOSIS — C8408 Mycosis fungoides, lymph nodes of multiple sites: Secondary | ICD-10-CM

## 2022-02-09 LAB — CBC WITH DIFFERENTIAL (CANCER CENTER ONLY)
Abs Immature Granulocytes: 0.02 10*3/uL (ref 0.00–0.07)
Basophils Absolute: 0 10*3/uL (ref 0.0–0.1)
Basophils Relative: 1 %
Eosinophils Absolute: 0.2 10*3/uL (ref 0.0–0.5)
Eosinophils Relative: 4 %
HCT: 40 % (ref 36.0–46.0)
Hemoglobin: 14 g/dL (ref 12.0–15.0)
Immature Granulocytes: 1 %
Lymphocytes Relative: 21 %
Lymphs Abs: 0.9 10*3/uL (ref 0.7–4.0)
MCH: 32.7 pg (ref 26.0–34.0)
MCHC: 35 g/dL (ref 30.0–36.0)
MCV: 93.5 fL (ref 80.0–100.0)
Monocytes Absolute: 0.4 10*3/uL (ref 0.1–1.0)
Monocytes Relative: 10 %
Neutro Abs: 2.8 10*3/uL (ref 1.7–7.7)
Neutrophils Relative %: 63 %
Platelet Count: 254 10*3/uL (ref 150–400)
RBC: 4.28 MIL/uL (ref 3.87–5.11)
RDW: 12.2 % (ref 11.5–15.5)
WBC Count: 4.3 10*3/uL (ref 4.0–10.5)
nRBC: 0 % (ref 0.0–0.2)

## 2022-02-09 LAB — CMP (CANCER CENTER ONLY)
ALT: 12 U/L (ref 0–44)
AST: 18 U/L (ref 15–41)
Albumin: 4.2 g/dL (ref 3.5–5.0)
Alkaline Phosphatase: 90 U/L (ref 38–126)
Anion gap: 7 (ref 5–15)
BUN: 15 mg/dL (ref 8–23)
CO2: 26 mmol/L (ref 22–32)
Calcium: 9.4 mg/dL (ref 8.9–10.3)
Chloride: 105 mmol/L (ref 98–111)
Creatinine: 0.9 mg/dL (ref 0.44–1.00)
GFR, Estimated: >60 mL/min (ref >60)
Glucose, Bld: 104 mg/dL — ABNORMAL HIGH (ref 70–99)
Potassium: 4.1 mmol/L (ref 3.5–5.1)
Sodium: 138 mmol/L (ref 135–145)
Total Bilirubin: 0.8 mg/dL (ref 0.3–1.2)
Total Protein: 7.3 g/dL (ref 6.5–8.1)

## 2022-02-09 LAB — LACTATE DEHYDROGENASE: LDH: 148 U/L (ref 98–192)

## 2022-02-09 MED ORDER — FAMOTIDINE IN NACL 20-0.9 MG/50ML-% IV SOLN
20.0000 mg | Freq: Once | INTRAVENOUS | Status: AC
  Administered 2022-02-09: 20 mg via INTRAVENOUS
  Filled 2022-02-09: qty 50

## 2022-02-09 MED ORDER — DIPHENHYDRAMINE HCL 25 MG PO CAPS
25.0000 mg | ORAL_CAPSULE | Freq: Once | ORAL | Status: AC
  Administered 2022-02-09: 25 mg via ORAL
  Filled 2022-02-09: qty 1

## 2022-02-09 MED ORDER — SODIUM CHLORIDE 0.9 % IV SOLN
1.0000 mg/kg | Freq: Once | INTRAVENOUS | Status: AC
  Administered 2022-02-09: 60 mg via INTRAVENOUS
  Filled 2022-02-09: qty 15

## 2022-02-09 MED ORDER — SODIUM CHLORIDE 0.9 % IV SOLN: INTRAVENOUS | Status: DC

## 2022-02-09 MED ORDER — ACETAMINOPHEN 325 MG PO TABS
650.0000 mg | ORAL_TABLET | Freq: Once | ORAL | Status: AC
  Administered 2022-02-09: 650 mg via ORAL
  Filled 2022-02-09: qty 2

## 2022-02-09 MED ORDER — METHYLPREDNISOLONE SODIUM SUCC 125 MG IJ SOLR
125.0000 mg | Freq: Once | INTRAMUSCULAR | Status: AC
  Administered 2022-02-09: 125 mg via INTRAVENOUS
  Filled 2022-02-09: qty 2

## 2022-02-10 ENCOUNTER — Other Ambulatory Visit: Payer: Self-pay | Admitting: Obstetrics & Gynecology

## 2022-02-10 ENCOUNTER — Telehealth: Payer: Self-pay | Admitting: Hematology

## 2022-02-10 DIAGNOSIS — Z1231 Encounter for screening mammogram for malignant neoplasm of breast: Secondary | ICD-10-CM

## 2022-02-10 NOTE — Telephone Encounter (Signed)
Left message with follow-up appointments per 4/5 los. ?

## 2022-02-15 ENCOUNTER — Encounter (HOSPITAL_BASED_OUTPATIENT_CLINIC_OR_DEPARTMENT_OTHER): Payer: Self-pay | Admitting: Obstetrics & Gynecology

## 2022-02-15 ENCOUNTER — Ambulatory Visit (INDEPENDENT_AMBULATORY_CARE_PROVIDER_SITE_OTHER): Payer: Medicare Other | Admitting: Sports Medicine

## 2022-02-15 VITALS — BP 116/62 | Ht 63.0 in | Wt 138.0 lb

## 2022-02-15 DIAGNOSIS — R269 Unspecified abnormalities of gait and mobility: Secondary | ICD-10-CM

## 2022-02-16 ENCOUNTER — Encounter: Payer: Self-pay | Admitting: Hematology

## 2022-02-16 NOTE — Progress Notes (Signed)
Patient ID: Alexa [name], female   DOB: [birth year], 72 y.o.   MRN: [id number] ? ?Patient presents today for new custom orthotics.  Last set of orthotics were created in 2016.  They were very helpful but unfortunately she has misplaced them.  She would like to proceed with a new pair.  New custom orthotics were created as below.  She did find them to be a little tight in the toebox despite releasing her shoes so she may need half-size bigger shoe.  She will take her new custom orthotics with her to [facility] and try them in a bigger shoe.  If they are still uncomfortable, she will return to the office for adjustment. ? ?Patient was fitted for a : standard, cushioned, semi-rigid orthotic. ?The orthotic was heated and afterward the patient stood on the orthotic blank positioned on the orthotic stand. ?The patient was positioned in subtalar neutral position and 10 degrees of ankle dorsiflexion in a weight bearing stance. ?After completion of molding, a stable base was applied to the orthotic blank. ?The blank was ground to a stable position for weight bearing. ?Size: 9 (I had to trim the toe end of the orthotic for her to fit in her current shoes) ?Base: [facility] EVA ?Posting: None ?Additional orthotic padding: None ? ? ?

## 2022-02-16 NOTE — Progress Notes (Signed)
? ? ?HEMATOLOGY/ONCOLOGY CLINIC NOTE ? ?Date of Service: .02/09/2022 ? ? ?Patient Care Team: ?Chesley Noon, MD as PCP - General (Family Medicine) ?Cantwell, Gerline Legacy, PA-C (Cardiology) ? 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 ? ?HISTORY OF PRESENTING ILLNESS:  ?Please see previous notes for details on initial presentation ? ?INTERVAL HISTORY ? ?[place] for her next monthly dose of mogamulizumab.  ?A little flare of her disease prior to each treatment and that Dr. Jeannine Kitten from [facility] is planning to start her on interferon. ?No fevers no chills no signs of acute infections. ?No new side effects to mogamulizumab at this time. ? ? ?MEDICAL HISTORY:  ?Past Medical History:  ?Diagnosis Date  ? Adenomatous polyp   ? Cancer [facility])   ? 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 Family history of anesthesia complication   ? sister and sister's children postitive for MH!!!!!!, pt tested negative 3-20yr ago  ? GERD (gastroesophageal reflux disease)   ? History of hypertension   ? off medication after weight loss  ? Hyperlipemia   ? Lymphoma ([facility]   ? light box therapy  ? 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[facility]   ? Osteopenia   ? bilateral hips  ? [place] ([place]  ? ? ?SURGICAL HISTORY: ?Past Surgical History:  ?Procedure Laterality Date  ? BREAST BIOPSY    ? COLONOSCOPY  03/07/2012  ? with biopsy  ? DILATATION & CURRETTAGE/HYSTEROSCOPY WITH RESECTOCOPE N/A 06/02/2014  ? Procedure: [place]  Surgeon: MLyman Speller MD;  [facility];  Service: Gynecology;  Laterality: N/A;  ? FOOT SURGERY    ? Right   ? TRANSANAL EXCISION OF RECTAL MASS N/A 06/09/2017  ? Procedure: TRANSANAL EXCISION OF RECTAL MASS;  Surgeon: TLeighton Ruff MD;  [facility]  Service: General;  Laterality: N/A;  ? ? ?SOCIAL HISTORY: ?Social History  ? ?Socioeconomic  History  ? Marital status: Single  ?  Spouse name: Not on file  ? Number of children: 0  ? Years of education: Not on file  ? Highest education level: Not on file  ?Occupational History  ? Not on file  ?Tobacco Use  ? Smoking status: Never  ? Smokeless tobacco: Never  ?Vaping Use  ? Vaping Use: Never used  ?Substance and Sexual Activity  ? Alcohol use: Yes  ?  Alcohol/week: 0.0 - 1.0 standard drinks  ? Drug use: No  ? Sexual activity: Not Currently  ?  Partners: Male  ?  Birth control/protection: Post-menopausal  ?Other Topics Concern  ? Not on file  ?Social History Narrative  ? Not on file  ? ?Social Determinants of Health  ? ?Financial Resource Strain: Not on file  ?Food Insecurity: Not on file  ?Transportation Needs: Not on file  ?Physical Activity: Not on file  ?Stress: Not on file  ?Social Connections: Not on file  ?Intimate Partner Violence: Not on file  ? ? ?FAMILY HISTORY: ?Family History  ?Problem Relation Age of Onset  ? Anuerysm Father   ? Heart disease Father   ? Diabetes Father   ? Cancer Father   ?     unknown type  ? Hypertension Father   ? Heart attack Mother   ? Heart disease Mother   ? Kidney disease Mother   ? Kidney cancer Mother   ?     contained tumor  ? Hypertension Mother   ?  Heart attack Brother   ? Cancer Brother   ?     liver cancer  ? Melanoma Brother   ? Other Brother   ?     Bypass surgery   ? Hypertension Sister   ? Ovarian cancer Paternal Grandmother   ?     was PMP  ? Colon cancer Neg Hx   ? ? ?ALLERGIES:  is allergic to ciprofloxacin, diphenhydramine hcl, and sudafed [pseudoephedrine hcl]. ? ?MEDICATIONS:  ?Current Outpatient Medications  ?Medication Sig Dispense Refill  ? amLODipine (NORVASC) 5 MG tablet Take 1 tablet (5 mg total) by mouth daily. 90 tablet 3  ? B Complex Vitamins (VITAMIN B COMPLEX) TABS Take by mouth. 5000 UT    ? Biotin 5000 MCG CAPS     ? cetirizine (ZYRTEC) 10 MG tablet Take 10 mg by mouth daily.    ? Cholecalciferol (VITAMIN D) 125 MCG (5000 UT) CAPS     ?  clotrimazole (LOTRIMIN) 1 % cream Apply topically 2 (two) times daily.    ? Cod Liver Oil 1000 MG CAPS Take by mouth.    ? Cranberry 1000 MG CAPS Take by mouth.    ? ELDERBERRY PO Take by mouth daily.    ? ezetimibe (ZETIA) 10 MG tablet Take 1 tablet (10 mg total) by mouth daily. 30 tablet 3  ? famotidine (PEPCID) 20 MG tablet Take 20 mg by mouth daily.    ? fluconazole (DIFLUCAN) 100 MG tablet Take 1 tablet (100 mg total) by mouth daily. (Patient not taking: Reported on 11/10/2021) 7 tablet 0  ? metoprolol tartrate (LOPRESSOR) 25 MG tablet Take 0.5 tablets (12.5 mg total) by mouth daily.    ? PARoxetine (PAXIL) 10 MG tablet Take 1 tablet (10 mg total) by mouth daily. (Patient not taking: Reported on 07/14/2021) 30 tablet 5  ? predniSONE (DELTASONE) 50 MG tablet Take 1 tab ('50mg'$ ) orally with breakfast for 5 days starting the morning after each Mogamulizumab treatment 30 tablet 0  ? Probiotic Product (PROBIOTIC DAILY PO) Take by mouth.    ? sodium chloride 0.9 % SOLN 250 mL with mogamulizumab-kpkc 20 MG/5ML SOLN 1 mg/kg Inject 1 mg/kg into the vein.    ? tacrolimus (PROTOPIC) 0.1 % ointment AAA BID to face PRN (Patient not taking: Reported on 12/15/2021)    ? triamcinolone cream (KENALOG) 0.1 %     ? ?No current facility-administered medications for this visit.  ? ? ?REVIEW OF SYSTEMS:   ?10 Point review of Systems was done is negative except as noted above. ? ?PHYSICAL EXAMINATION: ?ECOG PERFORMANCE STATUS: 1 - Symptomatic but completely ambulatory ?.BP 138/75   Pulse 71   Temp 97.7 ?F (36.5 ?C)   Resp 20   Wt 142 lb 1.6 oz (64.5 kg)   LMP 11/07/2002   SpO2 97%   BMI 25.58 kg/m?  ?NAD ?GENERAL:alert, in no acute distress and comfortable ?SKIN: no acute rashes, no significant lesions ?EYES: conjunctiva are pink and non-injected, sclera anicteric ?OROPHARYNX: MMM, no exudates, no oropharyngeal erythema or ulceration ?NECK: supple, no JVD ?LYMPH:  no palpable lymphadenopathy in the cervical, axillary or inguinal  regions ?LUNGS: clear to auscultation b/l with normal respiratory effort ?HEART: regular rate & rhythm ?ABDOMEN:  normoactive bowel sounds , non tender, not distended. ?Extremity: no pedal edema ?PSYCH: alert & oriented x 3 with fluent speech ?NEURO: no focal motor/sensory deficits ? ? ?LABORATORY DATA:  ?I have reviewed the data as listed ? ?. ? ?  Latest Ref Rng &  Units 02/09/2022  ?  9:42 AM 01/12/2022  ?  9:07 AM 12/15/2021  ?  9:29 AM  ?CBC  ?WBC 4.0 - 10.5 K/uL 4.3   4.5   5.4    ?Hemoglobin 12.0 - 15.0 g/dL 14.0   13.9   14.2    ?Hematocrit 36.0 - 46.0 % 40.0   40.1   40.3    ?Platelets 150 - 400 K/uL 254   246   236    ? ? ?. ? ?  Latest Ref Rng & Units 02/09/2022  ?  9:42 AM 01/12/2022  ?  9:07 AM 12/15/2021  ?  9:29 AM  ?CMP  ?Glucose 70 - 99 mg/dL 104   96   89    ?BUN 8 - 23 mg/dL '15   16   16    '$ ?Creatinine 0.44 - 1.00 mg/dL 0.90   0.84   0.90    ?Sodium 135 - 145 mmol/L 138   140   139    ?Potassium 3.5 - 5.1 mmol/L 4.1   4.1   4.0    ?Chloride 98 - 111 mmol/L 105   106   106    ?CO2 22 - 32 mmol/L '26   28   26    '$ ?Calcium 8.9 - 10.3 mg/dL 9.4   9.6   9.4    ?Total Protein 6.5 - 8.1 g/dL 7.3   7.1   7.3    ?Total Bilirubin 0.3 - 1.2 mg/dL 0.8   0.7   0.6    ?Alkaline Phos 38 - 126 U/L 90   86   89    ?AST 15 - 41 U/L '18   19   20    '$ ?ALT 0 - 44 U/L '12   14   15    '$ ? ?. ?Lab Results  ?Component Value Date  ? LDH 148 02/09/2022  ? ? ? ?RADIOGRAPHIC STUDIES: ?I have personally reviewed the radiological images as listed and agreed with the findings in the report. ?No results found. ? ? ?ASSESSMENT & PLAN:  ? ?70 yo with  ? ?1) CTCL with previous treatment as noted above. Mycosis fungoides with Sezary syndrome ?2) grade 1 through 2 skin reaction from mogamulizumab controlled with steroids. ?PLAN: ?--Patient has no significant disease progression at this time clinically or on labs. ?-CBC CMP stable. ?-Recent peripheral blood flow cytometry. ?Patient notes that Dr. Jeannine Kitten is intending to start her on interferon.  She will  continue following up regarding this with her team at [facility]. ?-We shall continue monthly mogamulizumab with current supportive medications. ?-Continue following up with Dr. Lanier Ensign and Dr. Irish Elders at [facility]

## 2022-02-17 ENCOUNTER — Other Ambulatory Visit (HOSPITAL_BASED_OUTPATIENT_CLINIC_OR_DEPARTMENT_OTHER): Payer: Self-pay | Admitting: *Deleted

## 2022-02-17 DIAGNOSIS — M810 Age-related osteoporosis without current pathological fracture: Secondary | ICD-10-CM

## 2022-02-17 NOTE — Progress Notes (Signed)
Pt needing order placed for bone density test to be done in June ?

## 2022-02-21 ENCOUNTER — Other Ambulatory Visit: Payer: Self-pay | Admitting: Hematology

## 2022-03-08 ENCOUNTER — Other Ambulatory Visit: Payer: Self-pay | Admitting: *Deleted

## 2022-03-08 DIAGNOSIS — C8590 Non-Hodgkin lymphoma, unspecified, unspecified site: Secondary | ICD-10-CM

## 2022-03-09 ENCOUNTER — Inpatient Hospital Stay: Payer: Medicare Other

## 2022-03-09 ENCOUNTER — Inpatient Hospital Stay (HOSPITAL_BASED_OUTPATIENT_CLINIC_OR_DEPARTMENT_OTHER): Payer: Medicare Other | Admitting: Hematology

## 2022-03-09 ENCOUNTER — Other Ambulatory Visit: Payer: Self-pay

## 2022-03-09 ENCOUNTER — Inpatient Hospital Stay: Payer: Medicare Other | Attending: Hematology

## 2022-03-09 VITALS — BP 142/76 | HR 67 | Temp 98.2°F | Resp 17 | Ht 63.0 in | Wt 145.1 lb

## 2022-03-09 DIAGNOSIS — C8590 Non-Hodgkin lymphoma, unspecified, unspecified site: Secondary | ICD-10-CM

## 2022-03-09 DIAGNOSIS — Z7952 Long term (current) use of systemic steroids: Secondary | ICD-10-CM | POA: Insufficient documentation

## 2022-03-09 DIAGNOSIS — K219 Gastro-esophageal reflux disease without esophagitis: Secondary | ICD-10-CM | POA: Diagnosis not present

## 2022-03-09 DIAGNOSIS — C8409 Mycosis fungoides, extranodal and solid organ sites: Secondary | ICD-10-CM | POA: Insufficient documentation

## 2022-03-09 DIAGNOSIS — M8580 Other specified disorders of bone density and structure, unspecified site: Secondary | ICD-10-CM | POA: Insufficient documentation

## 2022-03-09 DIAGNOSIS — E785 Hyperlipidemia, unspecified: Secondary | ICD-10-CM | POA: Insufficient documentation

## 2022-03-09 DIAGNOSIS — Z5111 Encounter for antineoplastic chemotherapy: Secondary | ICD-10-CM | POA: Diagnosis not present

## 2022-03-09 DIAGNOSIS — I10 Essential (primary) hypertension: Secondary | ICD-10-CM | POA: Diagnosis not present

## 2022-03-09 DIAGNOSIS — C8408 Mycosis fungoides, lymph nodes of multiple sites: Secondary | ICD-10-CM | POA: Diagnosis not present

## 2022-03-09 DIAGNOSIS — Z79899 Other long term (current) drug therapy: Secondary | ICD-10-CM | POA: Insufficient documentation

## 2022-03-09 DIAGNOSIS — Z5112 Encounter for antineoplastic immunotherapy: Secondary | ICD-10-CM | POA: Insufficient documentation

## 2022-03-09 DIAGNOSIS — Z7189 Other specified counseling: Secondary | ICD-10-CM

## 2022-03-09 LAB — CBC WITH DIFFERENTIAL (CANCER CENTER ONLY)
Abs Immature Granulocytes: 0.02 10*3/uL (ref 0.00–0.07)
Basophils Absolute: 0 10*3/uL (ref 0.0–0.1)
Basophils Relative: 1 %
Eosinophils Absolute: 0.1 10*3/uL (ref 0.0–0.5)
Eosinophils Relative: 2 %
HCT: 40.2 % (ref 36.0–46.0)
Hemoglobin: 14.3 g/dL (ref 12.0–15.0)
Immature Granulocytes: 0 %
Lymphocytes Relative: 22 %
Lymphs Abs: 1 10*3/uL (ref 0.7–4.0)
MCH: 33.3 pg (ref 26.0–34.0)
MCHC: 35.6 g/dL (ref 30.0–36.0)
MCV: 93.7 fL (ref 80.0–100.0)
Monocytes Absolute: 0.4 10*3/uL (ref 0.1–1.0)
Monocytes Relative: 10 %
Neutro Abs: 2.9 10*3/uL (ref 1.7–7.7)
Neutrophils Relative %: 65 %
Platelet Count: 246 10*3/uL (ref 150–400)
RBC: 4.29 MIL/uL (ref 3.87–5.11)
RDW: 11.9 % (ref 11.5–15.5)
WBC Count: 4.5 10*3/uL (ref 4.0–10.5)
nRBC: 0 % (ref 0.0–0.2)

## 2022-03-09 LAB — CMP (CANCER CENTER ONLY)
ALT: 17 U/L (ref 0–44)
AST: 22 U/L (ref 15–41)
Albumin: 4.3 g/dL (ref 3.5–5.0)
Alkaline Phosphatase: 71 U/L (ref 38–126)
Anion gap: 6 (ref 5–15)
BUN: 15 mg/dL (ref 8–23)
CO2: 26 mmol/L (ref 22–32)
Calcium: 9.1 mg/dL (ref 8.9–10.3)
Chloride: 108 mmol/L (ref 98–111)
Creatinine: 0.86 mg/dL (ref 0.44–1.00)
GFR, Estimated: >60 mL/min (ref >60)
Glucose, Bld: 93 mg/dL (ref 70–99)
Potassium: 3.9 mmol/L (ref 3.5–5.1)
Sodium: 140 mmol/L (ref 135–145)
Total Bilirubin: 0.8 mg/dL (ref 0.3–1.2)
Total Protein: 7.5 g/dL (ref 6.5–8.1)

## 2022-03-09 LAB — LACTATE DEHYDROGENASE: LDH: 139 U/L (ref 98–192)

## 2022-03-09 MED ORDER — METHYLPREDNISOLONE SODIUM SUCC 125 MG IJ SOLR
125.0000 mg | Freq: Once | INTRAMUSCULAR | Status: AC
  Administered 2022-03-09: 125 mg via INTRAVENOUS
  Filled 2022-03-09: qty 2

## 2022-03-09 MED ORDER — ACETAMINOPHEN 325 MG PO TABS
650.0000 mg | ORAL_TABLET | Freq: Once | ORAL | Status: AC
  Administered 2022-03-09: 650 mg via ORAL
  Filled 2022-03-09: qty 2

## 2022-03-09 MED ORDER — FAMOTIDINE IN NACL 20-0.9 MG/50ML-% IV SOLN
20.0000 mg | Freq: Once | INTRAVENOUS | Status: AC
  Administered 2022-03-09: 20 mg via INTRAVENOUS
  Filled 2022-03-09: qty 50

## 2022-03-09 MED ORDER — DIPHENHYDRAMINE HCL 25 MG PO CAPS
25.0000 mg | ORAL_CAPSULE | Freq: Once | ORAL | Status: AC
  Administered 2022-03-09: 25 mg via ORAL
  Filled 2022-03-09: qty 1

## 2022-03-09 MED ORDER — SODIUM CHLORIDE 0.9 % IV SOLN
1.0000 mg/kg | Freq: Once | INTRAVENOUS | Status: AC
  Administered 2022-03-09: 60 mg via INTRAVENOUS
  Filled 2022-03-09: qty 15

## 2022-03-09 MED ORDER — SODIUM CHLORIDE 0.9 % IV SOLN: Freq: Once | INTRAVENOUS | Status: AC

## 2022-03-09 NOTE — Patient Instructions (Signed)
[place]  Discharge Instructions: ?Thank you for choosing [facility] to provide your oncology and hematology care.  ? ?If you have a lab appointment with the [facility], please go directly to the [facility] and check in at the registration area. ?  ?Wear comfortable clothing and clothing appropriate for easy access to any Portacath or PICC line.  ? ?We strive to give you quality time with your provider. You may need to reschedule your appointment if you arrive late (15 or more minutes).  Arriving late affects you and other patients whose appointments are after yours.  Also, if you miss three or more appointments without notifying the office, you may be dismissed from the clinic at the provider?s discretion.    ?  ?For prescription refill requests, have your pharmacy contact our office and allow 72 hours for refills to be completed.   ? ?Today you received the following chemotherapy and/or immunotherapy agents: [place]    ?  ?To help prevent nausea and vomiting after your treatment, we encourage you to take your nausea medication as directed. ? ?BELOW ARE SYMPTOMS THAT SHOULD BE REPORTED IMMEDIATELY: ?*FEVER GREATER THAN 100.4 F (38 ?C) OR HIGHER ?*CHILLS OR SWEATING ?*NAUSEA AND VOMITING THAT IS NOT CONTROLLED WITH YOUR NAUSEA MEDICATION ?*UNUSUAL SHORTNESS OF BREATH ?*UNUSUAL BRUISING OR BLEEDING ?*URINARY PROBLEMS (pain or burning when urinating, or frequent urination) ?*BOWEL PROBLEMS (unusual diarrhea, constipation, pain near the anus) ?TENDERNESS IN MOUTH AND THROAT WITH OR WITHOUT PRESENCE OF ULCERS (sore throat, sores in mouth, or a toothache) ?UNUSUAL RASH, SWELLING OR PAIN  ?UNUSUAL VAGINAL DISCHARGE OR ITCHING  ? ?Items with * indicate a potential emergency and should be followed up as soon as possible or go to the Emergency Department if any problems should occur. ? ?Please show the CHEMOTHERAPY ALERT CARD or IMMUNOTHERAPY ALERT CARD at check-in to  the Emergency Department and triage nurse. ? ?Should you have questions after your visit or need to cancel or reschedule your appointment, please contact [facility]  Dept: [phone]  and follow the prompts.  Office hours are 8:00 a.m. to 4:30 p.m. Monday - Friday. Please note that voicemails left after 4:00 p.m. may not be returned until the following business day.  We are closed weekends and major holidays. You have access to a nurse at all times for urgent questions. Please call the main number to the clinic Dept: [phone] and follow the prompts. ? ? ?For any non-urgent questions, you may also contact your provider using MyChart. We now offer e-Visits for anyone 44 and older to request care online for non-urgent symptoms. For details visit [email or web address]. ?  ?Also download the MyChart app! Go to the app store, search "MyChart", open the app, select [facility], and log in with your MyChart username and password. ? ?Due to Covid, a mask is required upon entering the hospital/clinic. If you do not have a mask, one will be given to you upon arrival. For doctor visits, patients may have 1 support person aged 4 or older with them. For treatment visits, patients cannot have anyone with them due to current Covid guidelines and our immunocompromised population.  ? ?

## 2022-03-10 ENCOUNTER — Telehealth: Payer: Self-pay | Admitting: Student

## 2022-03-10 ENCOUNTER — Other Ambulatory Visit: Payer: Self-pay | Admitting: Hematology

## 2022-03-10 DIAGNOSIS — C8590 Non-Hodgkin lymphoma, unspecified, unspecified site: Secondary | ICD-10-CM

## 2022-03-10 MED ORDER — PREDNISONE 50 MG PO TABS: ORAL_TABLET | ORAL | 0 refills | Status: DC

## 2022-03-10 NOTE — Telephone Encounter (Signed)
Pt req 90 day supply refill for below meds ? ?ezetimibe (ZETIA) 10 MG tablet (Expired) ? ?[facility] [place], [place]  [phone] ?

## 2022-03-11 ENCOUNTER — Other Ambulatory Visit: Payer: Self-pay

## 2022-03-11 MED ORDER — EZETIMIBE 10 MG PO TABS: 10.0000 mg | ORAL_TABLET | Freq: Every day | ORAL | 1 refills | Status: DC

## 2022-03-11 NOTE — Telephone Encounter (Signed)
Medications has been refilled

## 2022-03-15 ENCOUNTER — Encounter: Payer: Self-pay | Admitting: Hematology

## 2022-03-15 NOTE — Progress Notes (Signed)
? ? ?HEMATOLOGY/ONCOLOGY CLINIC NOTE ? ?Date of Service: .03/09/2022 ? ? ?Patient Care Team: ?Chesley Noon, MD as PCP - General (Family Medicine) ?Cantwell, Gerline Legacy, PA-C (Cardiology) ? ?CHIEF COMPLAINTS/PURPOSE OF CONSULT ?Follow-up for continued evaluation and management of mycosis fungoides ? ? ?HISTORY OF PRESENTING ILLNESS:  ?Please see previous notes for details on initial presentation ? ?INTERVAL HISTORY ? ?Alexa [name] is here for continued evaluation and management of mycosis fungoides and neck cycle of mogamulizumab. ?She notes that since her last clinic visit she did follow-up with Dr. Jeannine Kitten at [facility] and Dr. Lanier Ensign and the decision was made against pursuing interferon as was being considered previously. ?Patient notes no acute new symptoms from her last dose of mogamulizumab.  She is sent to have a flare towards the end of her 4 weeks. ?No fevers no chills no night sweats. ?No other signs or symptoms of disease progression at this time. ?Labs done today were reviewed in detail with the patient. ? ?MEDICAL HISTORY:  ?Past Medical History:  ?Diagnosis Date  ? Adenomatous polyp   ? Cancer [facility])   ? 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 Family history of anesthesia complication   ? sister and sister's children postitive for MH!!!!!!, pt tested negative 3-85yr ago  ? GERD (gastroesophageal reflux disease)   ? History of hypertension   ? off medication after weight loss  ? Hyperlipemia   ? Lymphoma ([facility]   ? light box therapy  ? 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[facility]   ? Osteopenia   ? bilateral hips  ? [place] 04[place]  ? ? ?SURGICAL HISTORY: ?Past Surgical History:  ?Procedure Laterality Date  ? BREAST BIOPSY    ? COLONOSCOPY  03/07/2012  ? with biopsy  ? DILATATION & CURRETTAGE/HYSTEROSCOPY WITH RESECTOCOPE N/A 06/02/2014  ? Procedure: [place]  Surgeon: MLyman Speller MD;  [facility];  Service: Gynecology;   Laterality: N/A;  ? FOOT SURGERY    ? Right   ? TRANSANAL EXCISION OF RECTAL MASS N/A 06/09/2017  ? Procedure: TRANSANAL EXCISION OF RECTAL MASS;  Surgeon: TLeighton Ruff MD;  [facility]  Service: General;  Laterality: N/A;  ? ? ?SOCIAL HISTORY: ?Social History  ? ?Socioeconomic History  ? Marital status: Single  ?  Spouse name: Not on file  ? Number of children: 0  ? Years of education: Not on file  ? Highest education level: Not on file  ?Occupational History  ? Not on file  ?Tobacco Use  ? Smoking status: Never  ? Smokeless tobacco: Never  ?Vaping Use  ? Vaping Use: Never used  ?Substance and Sexual Activity  ? Alcohol use: Yes  ?  Alcohol/week: 0.0 - 1.0 standard drinks  ? Drug use: No  ? Sexual activity: Not Currently  ?  Partners: Male  ?  Birth control/protection: Post-menopausal  ?Other Topics Concern  ? Not on file  ?Social History Narrative  ? Not on file  ? ?Social Determinants of Health  ? ?Financial Resource Strain: Not on file  ?Food Insecurity: Not on file  ?Transportation Needs: Not on file  ?Physical Activity: Not on file  ?Stress: Not on file  ?Social Connections: Not on file  ?Intimate Partner Violence: Not on file  ? ? ?FAMILY HISTORY: ?Family History  ?Problem Relation Age of Onset  ? Anuerysm Father   ? Heart disease Father   ? Diabetes Father   ? Cancer Father   ?  unknown type  ? Hypertension Father   ? Heart attack Mother   ? Heart disease Mother   ? Kidney disease Mother   ? Kidney cancer Mother   ?     contained tumor  ? Hypertension Mother   ? Heart attack Brother   ? Cancer Brother   ?     liver cancer  ? Melanoma Brother   ? Other Brother   ?     Bypass surgery   ? Hypertension Sister   ? Ovarian cancer Paternal Grandmother   ?     was PMP  ? Colon cancer Neg Hx   ? ? ?ALLERGIES:  is allergic to ciprofloxacin, diphenhydramine hcl, and sudafed [pseudoephedrine hcl]. ? ?MEDICATIONS:  ?Current Outpatient Medications  ?Medication Sig Dispense Refill  ?  amLODipine (NORVASC) 5 MG tablet Take 1 tablet (5 mg total) by mouth daily. 90 tablet 3  ? B Complex Vitamins (VITAMIN B COMPLEX) TABS Take by mouth. 5000 UT    ? Biotin 5000 MCG CAPS     ? cetirizine (ZYRTEC) 10 MG tablet Take 10 mg by mouth daily.    ? Cholecalciferol (VITAMIN D) 125 MCG (5000 UT) CAPS     ? Cod Liver Oil 1000 MG CAPS Take by mouth.    ? Cranberry 1000 MG CAPS Take by mouth.    ? famotidine (PEPCID) 20 MG tablet Take 20 mg by mouth as needed.    ? metoprolol tartrate (LOPRESSOR) 25 MG tablet Take 0.5 tablets (12.5 mg total) by mouth daily.    ? Probiotic Product (PROBIOTIC DAILY PO) Take by mouth.    ? sodium chloride 0.9 % SOLN 250 mL with mogamulizumab-kpkc 20 MG/5ML SOLN 1 mg/kg Inject 1 mg/kg into the vein.    ? triamcinolone cream (KENALOG) 0.1 %     ? clotrimazole (LOTRIMIN) 1 % cream Apply topically 2 (two) times daily.    ? ELDERBERRY PO Take by mouth daily.    ? ezetimibe (ZETIA) 10 MG tablet Take 1 tablet (10 mg total) by mouth daily. 90 tablet 1  ? fluconazole (DIFLUCAN) 100 MG tablet Take 1 tablet (100 mg total) by mouth daily. 7 tablet 0  ? PARoxetine (PAXIL) 10 MG tablet Take 1 tablet (10 mg total) by mouth daily. 30 tablet 5  ? predniSONE (DELTASONE) 50 MG tablet Take 1 tab ('50mg'$ ) orally with breakfast for 5 days starting the morning after each Mogamulizumab treatment 30 tablet 0  ? tacrolimus (PROTOPIC) 0.1 % ointment     ? ?No current facility-administered medications for this visit.  ? ? ?REVIEW OF SYSTEMS:   ?10 Point review of Systems was done is negative except as noted above. ? ?PHYSICAL EXAMINATION: ?ECOG PERFORMANCE STATUS: 1 - Symptomatic but completely ambulatory ?.BP (!) 142/76 (BP Location: Left Arm, Patient Position: Sitting)   Pulse 67   Temp 98.2 ?F (36.8 ?C) (Temporal)   Resp 17   Ht '5\' 3"'$  (1.6 m)   Wt 145 lb 1.6 oz (65.8 kg)   LMP 11/07/2002   SpO2 100%   BMI 25.70 kg/m?  ?NAD ?GENERAL:alert, in no acute distress and comfortable ?SKIN: no acute rashes, no  significant lesions ?EYES: conjunctiva are pink and non-injected, sclera anicteric ?OROPHARYNX: MMM, no exudates, no oropharyngeal erythema or ulceration ?NECK: supple, no JVD ?LYMPH:  no palpable lymphadenopathy in the cervical, axillary or inguinal regions ?LUNGS: clear to auscultation b/l with normal respiratory effort ?HEART: regular rate & rhythm ?ABDOMEN:  normoactive bowel sounds , non  tender, not distended. ?Extremity: no pedal edema ?PSYCH: alert & oriented x 3 with fluent speech ?NEURO: no focal motor/sensory deficits ? ? ?LABORATORY DATA:  ?I have reviewed the data as listed ? ?. ? ?  Latest Ref Rng & Units 03/09/2022  ?  9:19 AM 02/09/2022  ?  9:42 AM 01/12/2022  ?  9:07 AM  ?CBC  ?WBC 4.0 - 10.5 K/uL 4.5   4.3   4.5    ?Hemoglobin 12.0 - 15.0 g/dL 14.3   14.0   13.9    ?Hematocrit 36.0 - 46.0 % 40.2   40.0   40.1    ?Platelets 150 - 400 K/uL 246   254   246    ? ? ?. ? ?  Latest Ref Rng & Units 03/09/2022  ?  9:19 AM 02/09/2022  ?  9:42 AM 01/12/2022  ?  9:07 AM  ?CMP  ?Glucose 70 - 99 mg/dL 93   104   96    ?BUN 8 - 23 mg/dL '15   15   16    '$ ?Creatinine 0.44 - 1.00 mg/dL 0.86   0.90   0.84    ?Sodium 135 - 145 mmol/L 140   138   140    ?Potassium 3.5 - 5.1 mmol/L 3.9   4.1   4.1    ?Chloride 98 - 111 mmol/L 108   105   106    ?CO2 22 - 32 mmol/L '26   26   28    '$ ?Calcium 8.9 - 10.3 mg/dL 9.1   9.4   9.6    ?Total Protein 6.5 - 8.1 g/dL 7.5   7.3   7.1    ?Total Bilirubin 0.3 - 1.2 mg/dL 0.8   0.8   0.7    ?Alkaline Phos 38 - 126 U/L 71   90   86    ?AST 15 - 41 U/L '22   18   19    '$ ?ALT 0 - 44 U/L '17   12   14    '$ ? ?. ?Lab Results  ?Component Value Date  ? LDH 139 03/09/2022  ? ? ? ?RADIOGRAPHIC STUDIES: ?I have personally reviewed the radiological images as listed and agreed with the findings in the report. ?No results found. ? ? ?ASSESSMENT & PLAN:  ? ?71 yo with  ? ?1) CTCL with previous treatment as noted above. Mycosis fungoides with Sezary syndrome ?2) grade 1 through 2 skin reaction from mogamulizumab  controlled with steroids. ?PLAN: ?-- Patient's labs done today were stable and were discussed in detail with her. ?-She notes no obvious lab or clinical findings suggestive of disease progression at this time. ?-No prohibitive toxicit

## 2022-03-21 ENCOUNTER — Telehealth: Payer: Self-pay | Admitting: Hematology

## 2022-03-21 NOTE — Telephone Encounter (Signed)
Scheduled follow-up appointment per 4/5 los. Patient is aware. ?

## 2022-04-04 ENCOUNTER — Encounter: Payer: Self-pay | Admitting: Hematology

## 2022-04-06 ENCOUNTER — Inpatient Hospital Stay: Payer: Medicare Other

## 2022-04-06 ENCOUNTER — Other Ambulatory Visit: Payer: Medicare Other

## 2022-04-06 ENCOUNTER — Inpatient Hospital Stay: Payer: Medicare Other | Admitting: Physician Assistant

## 2022-04-21 ENCOUNTER — Encounter: Payer: Self-pay | Admitting: Hematology

## 2022-04-21 ENCOUNTER — Encounter (HOSPITAL_BASED_OUTPATIENT_CLINIC_OR_DEPARTMENT_OTHER): Payer: Self-pay | Admitting: Obstetrics & Gynecology

## 2022-04-27 ENCOUNTER — Other Ambulatory Visit (HOSPITAL_BASED_OUTPATIENT_CLINIC_OR_DEPARTMENT_OTHER): Payer: Medicare Other

## 2022-05-04 ENCOUNTER — Ambulatory Visit: Payer: Medicare Other | Admitting: Hematology

## 2022-05-04 ENCOUNTER — Inpatient Hospital Stay: Payer: Medicare Other

## 2022-05-04 ENCOUNTER — Other Ambulatory Visit: Payer: Medicare Other

## 2022-05-04 ENCOUNTER — Ambulatory Visit: Payer: Medicare Other

## 2022-05-09 ENCOUNTER — Ambulatory Visit: Payer: Medicare Other | Admitting: Student

## 2022-05-09 ENCOUNTER — Inpatient Hospital Stay: Payer: Medicare Other | Admitting: Hematology

## 2022-05-09 ENCOUNTER — Inpatient Hospital Stay: Payer: Medicare Other

## 2022-05-09 ENCOUNTER — Encounter: Payer: Self-pay | Admitting: Student

## 2022-05-09 VITALS — BP 137/84 | HR 70 | Temp 98.7°F | Resp 16 | Ht 63.0 in | Wt 144.6 lb

## 2022-05-09 DIAGNOSIS — R002 Palpitations: Secondary | ICD-10-CM

## 2022-05-09 DIAGNOSIS — E7849 Other hyperlipidemia: Secondary | ICD-10-CM

## 2022-05-09 NOTE — Progress Notes (Addendum)
Primary Physician/Referring:  Alexa Noon, MD  Patient ID: Alexa [name], female    DOB: 10-Apr-1951, 71 y.o.   MRN: [id number]  Chief Complaint  Patient presents with   Palpitations   Hypertension   Hyperlipidemia    1 year   HPI:   Alexa [name]  is a 71 y.o. female with history of hypertension, hyperlipidemia, palpitations, mycosis fungoides, T-cell lymphoma, and colorectal cancer.  She was previously seen in our office 03/06/2017 for palpitations and family history of premature coronary artery disease.    Patient presents for annual follow-up.  At last office visit patient remained resistant to statin therapy, she did however undergo coronary calcium score which placed her in the 63rd percentile and started Zetia.  Repeat lipid profile testing is well controlled.  She continues to take Zetia 4 days/week without issue.  Patient notes anxiety regarding her health particularly given that her 75 year old nephew recently died of heart attack.  She does have rare palpitations for which she takes metoprolol as needed.  She has noticed occasional episodes of what she describes as "vibration" in the left side of her chest lasting several seconds intermittently over the last 2 days.  She has had no recurrence today.  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 @ [facility]   Family history of anesthesia complication    sister and sister's children postitive for MH!!!!!!, pt tested negative 3-50yr ago   GERD (gastroesophageal reflux disease)    History of hypertension    off medication after weight loss   Hyperlipemia    [place]    light box therapy   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[facility]    Osteopenia    bilateral hips   Rectal cancer ([facility] 04[place]   Past Surgical History:  Procedure Laterality Date   BREAST BIOPSY     COLONOSCOPY  03/07/2012   with biopsy   DILATATION &  CURRETTAGE/HYSTEROSCOPY WITH RESECTOCOPE N/A 06/02/2014   Procedure: DILATATION & CURETTAGE/HYSTEROSCOPY WITH RESECTOCOPE;  Surgeon: MLyman Speller MD;  [facility];  Service: Gynecology;  Laterality: N/A;   FOOT SURGERY     Right    TRANSANAL EXCISION OF RECTAL MASS N/A 06/09/2017   Procedure: TRANSANAL EXCISION OF RECTAL MASS;  Surgeon: TLeighton Ruff MD;  [facility]  Service: General;  Laterality: N/A;   Family History  Problem Relation Age of Onset   Heart attack Mother    Heart disease Mother    Kidney disease Mother    Kidney cancer Mother        contained tumor   Hypertension Mother    Anuerysm Father    Heart disease Father    Diabetes Father    Cancer Father        unknown type   Hypertension Father    Hypertension Sister    Heart attack Brother    Cancer Brother        liver cancer   Melanoma Brother    Other Brother        Bypass surgery    Ovarian cancer Paternal Grandmother        was PMP   Heart attack Nephew    Colon cancer Neg Hx   Father with MI in his 548sand CHF in his 658s  Brother with MI in early 656s  Social History   Tobacco Use   Smoking  status: Never   Smokeless tobacco: Never  Substance Use Topics   Alcohol use: Not Currently    Alcohol/week: 0.0 - 1.0 standard drinks of alcohol   Marital Status: Single   ROS  Review of Systems  Constitutional: Negative for malaise/fatigue and weight gain.  Cardiovascular:  Positive for palpitations (rare and brief). Negative for chest pain, claudication, leg swelling, near-syncope, orthopnea, paroxysmal nocturnal dyspnea and syncope.  Gastrointestinal:  Negative for melena.  Neurological:  Negative for dizziness.    Objective  Blood pressure 137/84, pulse 70, temperature 98.7 F (37.1 C), temperature source Temporal, resp. rate 16, height _0  (1.6 m), weight 144 lb 9.6 oz (65.6 kg), last menstrual period 11/07/2002, SpO2 96 %.     05/09/2022    9:50 AM  05/09/2022    9:38 AM 03/09/2022    9:25 AM  Vitals with BMI  Height  _1  _2   Weight  144 lbs 10 oz 145 lbs 2 oz  BMI  70.17 79.39  Systolic 030 092 330  Diastolic 84 77 76  Pulse 70 73 67     Physical Exam Vitals reviewed.  Cardiovascular:     Rate and Rhythm: Normal rate and regular rhythm. No extrasystoles are present.    Pulses: Intact distal pulses.          Carotid pulses are 2+ on the right side and 2+ on the left side.      Radial pulses are 2+ on the right side and 2+ on the left side.       Femoral pulses are 2+ on the right side and 2+ on the left side.      Popliteal pulses are 2+ on the right side and 2+ on the left side.       Dorsalis pedis pulses are 2+ on the right side and 2+ on the left side.       Posterior tibial pulses are 2+ on the right side and 2+ on the left side.     Heart sounds: S1 normal and S2 normal. No murmur heard.    No gallop.  Pulmonary:     Effort: Pulmonary effort is normal. No respiratory distress.     Breath sounds: No wheezing, rhonchi or rales.  Musculoskeletal:     Right lower leg: No edema.     Left lower leg: No edema.  Skin:    General: Skin is warm and dry.  Neurological:     Mental Status: She is alert.   Physical exam unchanged from previous office visit.  Laboratory examination:   Recent Labs    01/12/22 0907 02/09/22 0942 03/09/22 0919  NA 140 138 140  K 4.1 4.1 3.9  CL 106 105 108  CO2 _3 GLUCOSE 96 104* 93  BUN _4 CREATININE 0.84 0.90 0.86  CALCIUM 9.6 9.4 9.1  GFRNONAA >60 >60 >60   CrCl cannot be calculated (Patient's most recent lab result is older than the maximum 21 days allowed.).     Latest Ref Rng & Units 03/09/2022    9:19 AM 02/09/2022    9:42 AM 01/12/2022    9:07 AM  CMP  Glucose 70 - 99 mg/dL 93  104  96   BUN 8 - 23 mg/dL _5 Creatinine 0.44 - 1.00 mg/dL 0.86  0.90  0.84   Sodium 135 - 145 mmol/L 140  138  140   Potassium 3.5 -  5.1 mmol/L 3.9  4.1  4.1   Chloride 98  - 111 mmol/L 108  105  106   CO2 22 - 32 mmol/L _0 Calcium 8.9 - 10.3 mg/dL 9.1  9.4  9.6   Total Protein 6.5 - 8.1 g/dL 7.5  7.3  7.1   Total Bilirubin 0.3 - 1.2 mg/dL 0.8  0.8  0.7   Alkaline Phos 38 - 126 U/L 71  90  86   AST 15 - 41 U/L _1 ALT 0 - 44 U/L _2 Latest Ref Rng & Units 03/09/2022    9:19 AM 02/09/2022    9:42 AM 01/12/2022    9:07 AM  CBC  WBC 4.0 - 10.5 K/uL 4.5  4.3  4.5   Hemoglobin 12.0 - 15.0 g/dL 14.3  14.0  13.9   Hematocrit 36.0 - 46.0 % 40.2  40.0  40.1   Platelets 150 - 400 K/uL 246  254  246     Lipid Panel No results for input(s): "CHOL", "TRIG", "[place]", "VLDL", "HDL", "CHOLHDL", "LDLDIRECT" in the last 8760 hours.  HEMOGLOBIN A1C No results found for: "HGBA1C", "MPG" TSH No results for input(s): "TSH" in the last 8760 hours.  External labs:  02/23/2022: Sodium 140, potassium 5.2, BUN 23, creatinine 0.9, ALC 20, AST 32 GFR >60 Hgb 14.7, HCT 43.2, platelet 257  12/22/2021: TSH 2.39 Total cholesterol 193, triglycerides 128, HDL 81, LDL 90  10/28/2020: Total cholesterol 214, triglycerides 142, HDL 53, LDL 136  09/14/2020: Sodium 138, potassium 4.2, creatinine 1.0, BUN 20, EGFR 58 Hemoglobin 13.9, hematocrit 40.3, platelets 257 TSH 2.39  Allergies   Allergies  Allergen Reactions   Ciprofloxacin Other (See Comments)    Gluteus medius tear   Diphenhydramine Hcl     Other reaction(s): Other (See Comments) Pt becomes extremely hyper; pt requested premed for Mogamulizumab per NP   Sudafed [Pseudoephedrine Hcl] Palpitations    Medications Prior to Visit:   Outpatient Medications Prior to Visit  Medication Sig Dispense Refill   amLODipine (NORVASC) 5 MG tablet Take 1 tablet (5 mg total) by mouth daily. 90 tablet 3   B Complex Vitamins (VITAMIN B COMPLEX) TABS Take by mouth. 5000 UT     Biotin 5000 MCG CAPS      cetirizine (ZYRTEC) 10 MG tablet Take 10 mg by mouth daily.     Cholecalciferol (VITAMIN D) 125 MCG  (5000 UT) CAPS      Cod Liver Oil 1000 MG CAPS Take by mouth.     Cranberry 1000 MG CAPS Take by mouth.     ezetimibe (ZETIA) 10 MG tablet Take 1 tablet (10 mg total) by mouth daily. 90 tablet 1   metoprolol tartrate (LOPRESSOR) 25 MG tablet Take 0.5 tablets (12.5 mg total) by mouth daily.     predniSONE (DELTASONE) 50 MG tablet Take 1 tab (53m) orally with breakfast for 5 days starting the morning after each Mogamulizumab treatment 30 tablet 0   Probiotic Product (PROBIOTIC DAILY PO) Take by mouth.     sodium chloride 0.9 % SOLN 250 mL with mogamulizumab-kpkc 20 MG/5ML SOLN 1 mg/kg Inject 1 mg/kg into the vein.     triamcinolone cream (KENALOG) 0.1 %      vitamin k 100 MCG tablet Take 100 mcg by mouth daily.     clotrimazole (LOTRIMIN) 1 % cream Apply topically 2 (two) times  daily.     ELDERBERRY PO Take by mouth daily.     famotidine (PEPCID) 20 MG tablet Take 20 mg by mouth as needed.     fluconazole (DIFLUCAN) 100 MG tablet Take 1 tablet (100 mg total) by mouth daily. 7 tablet 0   PARoxetine (PAXIL) 10 MG tablet Take 1 tablet (10 mg total) by mouth daily. 30 tablet 5   tacrolimus (PROTOPIC) 0.1 % ointment      No facility-administered medications prior to visit.   Final Medications at End of Visit    Current Meds  Medication Sig   amLODipine (NORVASC) 5 MG tablet Take 1 tablet (5 mg total) by mouth daily.   B Complex Vitamins (VITAMIN B COMPLEX) TABS Take by mouth. 5000 UT   Biotin 5000 MCG CAPS    cetirizine (ZYRTEC) 10 MG tablet Take 10 mg by mouth daily.   Cholecalciferol (VITAMIN D) 125 MCG (5000 UT) CAPS    Cod Liver Oil 1000 MG CAPS Take by mouth.   Cranberry 1000 MG CAPS Take by mouth.   ezetimibe (ZETIA) 10 MG tablet Take 1 tablet (10 mg total) by mouth daily.   metoprolol tartrate (LOPRESSOR) 25 MG tablet Take 0.5 tablets (12.5 mg total) by mouth daily.   predniSONE (DELTASONE) 50 MG tablet Take 1 tab (24m) orally with breakfast for 5 days starting the morning after  each Mogamulizumab treatment   Probiotic Product (PROBIOTIC DAILY PO) Take by mouth.   sodium chloride 0.9 % SOLN 250 mL with mogamulizumab-kpkc 20 MG/5ML SOLN 1 mg/kg Inject 1 mg/kg into the vein.   triamcinolone cream (KENALOG) 0.1 %    vitamin k 100 MCG tablet Take 100 mcg by mouth daily.   Radiology:   No results found.  Cardiac Studies:   LONG TERM MONITOR 10/07/2020-10/21/2020:  Patient had minimum heart rate of 46 bpm, maximum heart rate of 23 bpm with an average heart rate of 73 bpm.  Predominant rhythm was sinus.  There were 12 patient triggered events which primarily correlated with sinus rhythm, and occasional ventricular ectopy.  Patient did have symptomatic supraventricular tachycardia with the fastest interval lasting 7 beats at a maximum rate of 203 bpm, longest lasting 8 beats with an average heart rate of 93 bpm.  Rare PVCs and PACs.  Patient also with several brief episodes of atrial tachycardia.  Echocardiogram 02/09/2017: 1.  Left ventricle cavity is normal in size.  Normal global wall motion.  Normal diastolic filling pattern.  Normal LAP.  Calculated EF 50% 2.  Trace mitral regurgitation. 3.  Trace tricuspid regurgitation.  No evidence of pulmonary hypertension  Stress test 02/13/2017: Resting EKG demonstrates normal sinus rhythm.  The patient exercised according to Bruce protocol, total time recorded 9 minutes achieving maximum heart rate of 169 which was 107% of target heart rate for age and 10.16 METS of work.  Stress terminated due to fatigue, THR met.  Hypertensive blood pressure response.  There was no ST-T changes of ischemia with exercise stress test.  There were no significant arrhythmias.  Normal blood pressure response. No evidence of ischemia by GXT.  Exercise tolerance is normal.  Hypertensive blood pressure response.  Continue preventive therapy.  Bilateral carotid artery duplex 01/30/2017: Minimal amount of bilateral intimal thickening and  atherosclerotic plaque, right greater than left, not resulting in a hemodynamically significant stenosis within either internal carotid artery.  EKG:  05/09/2022: Sinus rhythm at a rate of 61 bpm.  Normal axis.  No evidence of ischemia or  underlying injury pattern.  Compared EKG 05/18/2021, no significant change.  Assessment     ICD-10-CM   1. Palpitations  R00.2 EKG 12-Lead    2. Other hyperlipidemia  E78.49        There are no discontinued medications.   No orders of the defined types were placed in this encounter.   Recommendations:   Deanza Upperman Bour is a 71 y.o. female with history of hypertension, hyperlipidemia, palpitations, mycosis fungoides, T-cell lymphoma, and colorectal cancer.  She was previously seen in our office 03/06/2017 for palpitations and family history of premature coronary artery disease.    Patient presents for annual follow-up.  At last office visit patient remained resistant to statin therapy, she did however undergo coronary calcium score which placed her in the 63rd percentile and started Zetia.  I personally reviewed external labs, lipids are now well controlled.  We will continue current medications as physical exam, EKG are stable.  Blood pressure is well controlled.  Given continued palpitations and some fatigue discussed patient of echocardiogram as well as GXT, however patient is overall doing well clinically.  Patient will think about further testing and notify our office if she chooses to proceed.  Follow-up in 6 months, sooner if needed.   Alethia Berthold, PA-C 05/09/2022, 11:16 AM Office: [phone]  Addendum:  Originally total cholesterol was listed incorrectly above as 139, it has been corrected to the proper 193.    Alethia Berthold, PA-C 05/19/2022, 8:00 AM Office: [phone]

## 2022-05-11 ENCOUNTER — Ambulatory Visit: Payer: 59 | Admitting: Student

## 2022-05-11 ENCOUNTER — Ambulatory Visit (HOSPITAL_BASED_OUTPATIENT_CLINIC_OR_DEPARTMENT_OTHER)
Admission: RE | Admit: 2022-05-11 | Discharge: 2022-05-11 | Disposition: A | Discharge status: Home / Self Care | Payer: Medicare Other | Source: Ambulatory Visit | Attending: Obstetrics & Gynecology | Admitting: Obstetrics & Gynecology

## 2022-05-11 DIAGNOSIS — M810 Age-related osteoporosis without current pathological fracture: Secondary | ICD-10-CM | POA: Insufficient documentation

## 2022-05-17 ENCOUNTER — Telehealth: Payer: Self-pay

## 2022-05-17 NOTE — Telephone Encounter (Signed)
Pt called to inform us that on her visit note there was an error on the numbers. Pt would like for you to correct it. Total chol should be around 193 instead of 139.

## 2022-05-18 ENCOUNTER — Ambulatory Visit: Payer: 59 | Admitting: Student

## 2022-05-26 ENCOUNTER — Encounter (HOSPITAL_BASED_OUTPATIENT_CLINIC_OR_DEPARTMENT_OTHER): Payer: Self-pay | Admitting: Obstetrics & Gynecology

## 2022-05-26 ENCOUNTER — Other Ambulatory Visit (HOSPITAL_COMMUNITY)
Admission: RE | Admit: 2022-05-26 | Discharge: 2022-05-26 | Disposition: A | Discharge status: Home / Self Care | Payer: Medicare Other | Source: Ambulatory Visit | Attending: Obstetrics & Gynecology | Admitting: Obstetrics & Gynecology

## 2022-05-26 ENCOUNTER — Ambulatory Visit (INDEPENDENT_AMBULATORY_CARE_PROVIDER_SITE_OTHER): Payer: Medicare Other | Admitting: Obstetrics & Gynecology

## 2022-05-26 VITALS — BP 123/74 | HR 67 | Ht 63.0 in | Wt 144.0 lb

## 2022-05-26 DIAGNOSIS — C8590 Non-Hodgkin lymphoma, unspecified, unspecified site: Secondary | ICD-10-CM | POA: Diagnosis not present

## 2022-05-26 DIAGNOSIS — Z124 Encounter for screening for malignant neoplasm of cervix: Secondary | ICD-10-CM | POA: Insufficient documentation

## 2022-05-26 DIAGNOSIS — C19 Malignant neoplasm of rectosigmoid junction: Secondary | ICD-10-CM | POA: Diagnosis not present

## 2022-05-26 DIAGNOSIS — N762 Acute vulvitis: Secondary | ICD-10-CM | POA: Diagnosis present

## 2022-05-26 DIAGNOSIS — Z780 Asymptomatic menopausal state: Secondary | ICD-10-CM | POA: Diagnosis not present

## 2022-05-26 DIAGNOSIS — M818 Other osteoporosis without current pathological fracture: Secondary | ICD-10-CM

## 2022-05-26 DIAGNOSIS — Z01419 Encounter for gynecological examination (general) (routine) without abnormal findings: Secondary | ICD-10-CM | POA: Diagnosis not present

## 2022-05-26 NOTE — Progress Notes (Signed)
[place] y.o. G1P0 Single White or Caucasian female here for breast and pelvic exam.  I am also following her for osteopenia/osteoporosis.  She is still on steroids but hopes to be off this when her medication changes.  Has met with provider knowledgeable vitamins.  Has some questions for me as well.  She has some questions about vaginal estrogen cream.  Was prescribed by Dr. Zigmund Daniel.  She was having recurrent UTI issues.  She is considering starting.    Has not had any treatment since May as she has developed a rash to the medication/treatment.  The dermatologist feels she really should be off it but her oncologist thinks the medication is really helping.  It has been frustrating for these providers to not communicate better.  Blood work in March was much improved.    Denies vaginal bleeding.  Patient's last menstrual period was 11/07/2002.          Sexually active: No.  H/O STD:  no  Health Maintenance: PCP:  Dr. Melford Aase.  Last wellness appt was 11/2021.  Did blood work at that appt:  yes Vaccines are up to date:  reviewed with pt.  Tdap was due last year.  No recent exposure.   Colonoscopy:  05/12/2021 MMG:  05/26/2021 Negative.  Scheduled tomorrow. BMD:  05/11/2022 Osteoporosis in the right hip Last pap smear:  05/14/2020 Negative.   H/o abnormal pap smear:  remote hx    reports that she has never smoked. She has never used smokeless tobacco. She reports that she does not currently use alcohol. She reports that she does not use drugs.  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 @ [facility]   Family history of anesthesia complication    sister and sister's children postitive for MH!!!!!!, pt tested negative 3-14yr ago   GERD (gastroesophageal reflux disease)    History of hypertension    off medication after weight loss   Hyperlipemia    [place]    light [place] therapy   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[place]     Osteopenia    bilateral hips   Rectal cancer ([place] 04[place]2017    Past Surgical History:  Procedure Laterality Date   BREAST BIOPSY     COLONOSCOPY  03/07/2012   with biopsy   DILATATION & CURRETTAGE/HYSTEROSCOPY WITH RESECTOCOPE N/A 06/02/2014   Procedure: DILATATION & CURETTAGE/HYSTEROSCOPY WITH RESECTOCOPE;  Surgeon: MLyman Speller MD;  [facility];  Service: Gynecology;  Laterality: N/A;   FOOT SURGERY     Right    TRANSANAL EXCISION OF RECTAL MASS N/A 06/09/2017   Procedure: TRANSANAL EXCISION OF RECTAL MASS;  Surgeon: TLeighton Ruff MD;  [facility]  Service: General;  Laterality: N/A;    Current Outpatient Medications  Medication Sig Dispense Refill   B Complex Vitamins (VITAMIN B COMPLEX) TABS Take by mouth. 5000 UT     Biotin 5000 MCG CAPS      cetirizine (ZYRTEC) 10 MG tablet Take 10 mg by mouth daily.     Cholecalciferol (VITAMIN D) 125 MCG (5000 UT) CAPS      clotrimazole (LOTRIMIN) 1 % cream Apply topically 2 (two) times daily.     Cod Liver Oil 1000 MG CAPS Take by mouth.     Cranberry 1000 MG CAPS Take by mouth.     ELDERBERRY PO Take by mouth daily.     ezetimibe (ZETIA)  10 MG tablet Take 1 tablet (10 mg total) by mouth daily. 90 tablet 1   famotidine (PEPCID) 20 MG tablet Take 20 mg by mouth as needed.     predniSONE (DELTASONE) 50 MG tablet Take 1 tab (10m) orally with breakfast for 5 days starting the morning after each Mogamulizumab treatment 30 tablet 0   Probiotic Product (PROBIOTIC DAILY PO) Take by mouth.     sodium chloride 0.9 % SOLN 250 mL with mogamulizumab-kpkc 20 MG/5ML SOLN 1 mg/kg Inject 1 mg/kg into the vein.     tacrolimus (PROTOPIC) 0.1 % ointment      triamcinolone cream (KENALOG) 0.1 %      vitamin k 100 MCG tablet Take 100 mcg by mouth daily.     amLODipine (NORVASC) 5 MG tablet Take 1 tablet (5 mg total) by mouth daily. 90 tablet 3   metoprolol tartrate (LOPRESSOR) 25 MG tablet Take 0.5 tablets (12.5 mg  total) by mouth daily.     No current facility-administered medications for this visit.    Family History  Problem Relation Age of Onset   Heart attack Mother    Heart disease Mother    Kidney disease Mother    Kidney cancer Mother        contained tumor   Hypertension Mother    Anuerysm Father    Heart disease Father    Diabetes Father    Cancer Father        unknown type   Hypertension Father    Hypertension Sister    Heart attack Brother    Cancer Brother        liver cancer   Melanoma Brother    Other Brother        Bypass surgery    Ovarian cancer Paternal Grandmother        was PMP   Heart attack Nephew    Colon cancer Neg Hx     Review of Systems  All other systems reviewed and are negative.   Exam:   BP 123/74 (BP Location: Right Arm, Patient Position: Sitting, Cuff Size: Normal)   Pulse 67   Ht 5' 3"  (1.6 m) Comment: Reported  Wt 144 lb (65.3 kg)   LMP 11/07/2002   BMI 25.51 kg/m   Height: 5' 3"  (160 cm) (Reported)  General appearance: alert, cooperative and appears stated age Breasts: normal appearance, no masses or tenderness Abdomen: soft, non-tender; bowel sounds normal; no masses,  no organomegaly Lymph nodes: Cervical, supraclavicular, and axillary nodes normal.  No abnormal inguinal nodes palpated Neurologic: Grossly normal  Pelvic: External genitalia:  no lesions              Urethra:  normal appearing urethra with no masses, tenderness or lesions              Bartholins and Skenes: normal                 Vagina: normal appearing vagina with atrophic changes and no discharge, no lesions              Cervix: no lesions              Pap taken: Yes.   Bimanual Exam:  Uterus:  normal size, contour, position, consistency, mobility, non-tender              Adnexa: normal adnexa and no mass, fullness, tenderness               Rectovaginal: Confirms  Anus:  normal sphincter tone, no lesions  Chaperone, Octaviano Batty, CMA, was present  for exam.  Assessment/Plan: 1. Encntr for gyn exam (general) (routine) w/o abn findings - pap smear obtained today - MMG scheduled tomorrow - Colonscopy 05/2021 - BMD will be repeated in 2 years - lab work reviewed - vaccines reviewed/updated  2. Cervical cancer screening - Cytology - PAP( [place]) - PR OBTAINING SCREEN PAP SMEAR  3. Colorectal cancer, stage I ([place]),  - continues to be followed by Dr. Collene Mares  4. T-cell lymphoma ([place])  5. Postmenopausal - no HRT  6. Age-related osteoporosis without fracture -is going to start adding additional calcium supplementation  7. Vulvitis - testing for yeast obtained today

## 2022-05-27 ENCOUNTER — Ambulatory Visit
Admission: RE | Admit: 2022-05-27 | Discharge: 2022-05-27 | Disposition: A | Discharge status: Home / Self Care | Payer: Medicare Other | Source: Ambulatory Visit | Attending: Obstetrics & Gynecology | Admitting: Obstetrics & Gynecology

## 2022-05-27 DIAGNOSIS — Z1231 Encounter for screening mammogram for malignant neoplasm of breast: Secondary | ICD-10-CM

## 2022-05-27 LAB — CERVICOVAGINAL ANCILLARY ONLY
Candida Glabrata: NEGATIVE
Candida Vaginitis: NEGATIVE
Comment: NEGATIVE
Comment: NEGATIVE

## 2022-05-30 ENCOUNTER — Other Ambulatory Visit (HOSPITAL_BASED_OUTPATIENT_CLINIC_OR_DEPARTMENT_OTHER): Payer: Medicare Other

## 2022-05-30 ENCOUNTER — Other Ambulatory Visit: Payer: Self-pay

## 2022-05-30 ENCOUNTER — Encounter (HOSPITAL_BASED_OUTPATIENT_CLINIC_OR_DEPARTMENT_OTHER): Payer: Self-pay | Admitting: Obstetrics & Gynecology

## 2022-05-30 LAB — CYTOLOGY - PAP: Diagnosis: NEGATIVE

## 2022-06-01 ENCOUNTER — Other Ambulatory Visit (HOSPITAL_BASED_OUTPATIENT_CLINIC_OR_DEPARTMENT_OTHER): Payer: Medicare Other

## 2022-06-02 ENCOUNTER — Encounter: Payer: Self-pay | Admitting: Hematology

## 2022-06-02 ENCOUNTER — Telehealth: Payer: Self-pay | Admitting: *Deleted

## 2022-06-02 NOTE — Telephone Encounter (Signed)
Late entry for 06/01/2022: "[place] [phone] (home) trying to reach the department that assist with legal dependents, living wills but do not recall name of this department.  They help you complete Advanced Directives."  Attempted extensions for social work unsuccessfully.  Advised to call Counseling/Support program.  Provided extension [phone] and chaplain's name as directed per Lorrin Jackson. Currently no further questions or needs with this call.

## 2022-06-07 ENCOUNTER — Other Ambulatory Visit: Payer: Self-pay

## 2022-06-08 ENCOUNTER — Inpatient Hospital Stay: Payer: Medicare Other

## 2022-06-08 ENCOUNTER — Inpatient Hospital Stay: Payer: Medicare Other | Admitting: Hematology

## 2022-07-27 ENCOUNTER — Other Ambulatory Visit: Payer: Self-pay

## 2022-07-27 ENCOUNTER — Inpatient Hospital Stay: Payer: Medicare Other

## 2022-07-27 ENCOUNTER — Inpatient Hospital Stay: Payer: Medicare Other | Attending: Hematology | Admitting: Hematology

## 2022-07-27 VITALS — BP 150/73 | HR 66 | Temp 97.7°F | Resp 18 | Ht 63.0 in | Wt 141.5 lb

## 2022-07-27 DIAGNOSIS — C84A0 Cutaneous T-cell lymphoma, unspecified, unspecified site: Secondary | ICD-10-CM | POA: Insufficient documentation

## 2022-07-27 DIAGNOSIS — C8408 Mycosis fungoides, lymph nodes of multiple sites: Secondary | ICD-10-CM | POA: Diagnosis not present

## 2022-08-03 ENCOUNTER — Encounter: Payer: Self-pay | Admitting: Hematology

## 2022-08-03 NOTE — Progress Notes (Addendum)
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  She last got her hemoglobin as my treatment in May and had a drug reaction characterized by a skin rash.  Her skin rashes since resolved.  She is currently using bluelight therapy but has not been started on any new systemic therapies at this time.  Dr. Lanier Ensign her primary oncologist who is at [facility] and Dr. Jeannine Kitten her" dermatologist are discussing various treatment options including high-dose methotrexate with leucovorin rescue etc.  Currently she is doing well and felt that sunshine at the beach helped her skin rash.  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 @ [facility]   Family history of anesthesia complication    sister and sister's children postitive for MH!!!!!!, pt tested negative 3-59yr ago   GERD (gastroesophageal reflux disease)    History of hypertension    off medication after weight loss   Hyperlipemia    [place]    light box therapy   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[place]    Osteopenia    bilateral hips   [place]    SURGICAL HISTORY: Past Surgical History:  Procedure Laterality Date   BREAST BIOPSY     COLONOSCOPY  03/07/2012   with biopsy   DILATATION & CURRETTAGE/HYSTEROSCOPY WITH RESECTOCOPE N/A 06/02/2014   Procedure: DILATATION & CURETTAGE/HYSTEROSCOPY WITH RESECTOCOPE;  Surgeon: MLyman Speller MD;  [facility];   Service: Gynecology;  Laterality: N/A;   FOOT SURGERY     Right    TRANSANAL EXCISION OF RECTAL MASS N/A 06/09/2017   Procedure: TRANSANAL EXCISION OF RECTAL MASS;  Surgeon: TLeighton Ruff MD;  [facility]  Service: General;  Laterality: N/A;    SOCIAL HISTORY: Social History   Socioeconomic History   Marital status: Single    Spouse name: Not on file   Number of children: 0   Years of education: Not on file   Highest education level: Not on file  Occupational History   Not on file  Tobacco Use   Smoking status: Never   Smokeless tobacco: Never  Vaping Use   Vaping Use: Never used  Substance and Sexual Activity   Alcohol use: Not Currently    Alcohol/week: 0.0 - 1.0 standard drinks of alcohol   Drug use: No   Sexual activity: Not Currently    Partners: Male    Birth control/protection: Post-menopausal  Other Topics Concern   Not on file  Social History Narrative   Not on file   Social Determinants of Health   Financial Resource Strain: Not on file  Food Insecurity: Not on file  Transportation Needs: Not on file  Physical Activity: Not on file  Stress: Not on file  Social Connections: Not on file  Intimate Partner Violence: Not on file    FAMILY HISTORY: Family History  Problem Relation Age of Onset   Heart attack Mother    Heart disease Mother    Kidney disease Mother  Kidney cancer Mother        contained tumor   Hypertension Mother    Anuerysm Father    Heart disease Father    Diabetes Father    Cancer Father        unknown type   Hypertension Father    Hypertension Sister    Heart attack Brother    Cancer Brother        liver cancer   Melanoma Brother    Other Brother        Bypass surgery    Ovarian cancer Paternal Grandmother        was PMP   Heart attack Nephew    Colon cancer Neg Hx     ALLERGIES:  is allergic to ciprofloxacin, diphenhydramine hcl, and sudafed [pseudoephedrine hcl].  MEDICATIONS:  Current  Outpatient Medications  Medication Sig Dispense Refill   amLODipine (NORVASC) 5 MG tablet Take 1 tablet (5 mg total) by mouth daily. 90 tablet 3   B Complex Vitamins (VITAMIN B COMPLEX) TABS Take by mouth. 5000 UT     Biotin 5000 MCG CAPS      cetirizine (ZYRTEC) 10 MG tablet Take 10 mg by mouth daily.     Cholecalciferol (VITAMIN D) 125 MCG (5000 UT) CAPS      Cod Liver Oil 1000 MG CAPS Take by mouth.     Cranberry 1000 MG CAPS Take by mouth.     ELDERBERRY PO Take by mouth daily.     ezetimibe (ZETIA) 10 MG tablet Take 1 tablet (10 mg total) by mouth daily. 90 tablet 1   famotidine (PEPCID) 20 MG tablet Take 20 mg by mouth as needed.     metoprolol tartrate (LOPRESSOR) 25 MG tablet Take 0.5 tablets (12.5 mg total) by mouth daily.     predniSONE (DELTASONE) 50 MG tablet Take 1 tab ('50mg'$ ) orally with breakfast for 5 days starting the morning after each Mogamulizumab treatment 30 tablet 0   Probiotic Product (PROBIOTIC DAILY PO) Take by mouth.     sodium chloride 0.9 % SOLN 250 mL with mogamulizumab-kpkc 20 MG/5ML SOLN 1 mg/kg Inject 1 mg/kg into the vein.     tacrolimus (PROTOPIC) 0.1 % ointment      triamcinolone cream (KENALOG) 0.1 %      vitamin k 100 MCG tablet Take 100 mcg by mouth daily.     No current facility-administered medications for this visit.    REVIEW OF SYSTEMS:   10 Point review of Systems was done is negative except as noted above.  PHYSICAL EXAMINATION: ECOG PERFORMANCE STATUS: 1 - Symptomatic but completely ambulatory .BP (!) 150/73 (BP Location: Left Arm, Patient Position: Sitting)   Pulse 66   Temp 97.7 F (36.5 C) (Temporal)   Resp 18   Ht '5\' 3"'$  (1.6 m)   Wt 141 lb 8 oz (64.2 kg)   LMP 11/07/2002   SpO2 97%   BMI 25.07 kg/m  NAD GENERAL:alert, in no acute distress and comfortable EYES: conjunctiva are pink and non-injected, sclera anicteric OROPHARYNX: MMM, no exudates, no oropharyngeal erythema or ulceration NECK: supple, no JVD LYMPH:  no palpable  lymphadenopathy in the cervical, axillary or inguinal regions LUNGS: clear to auscultation b/l with normal respiratory effort HEART: regular rate & rhythm ABDOMEN:  normoactive bowel sounds , non tender, not distended. Extremity: no pedal edema PSYCH: alert & oriented x 3 with fluent speech NEURO: no focal motor/sensory deficits  LABORATORY DATA:  I have reviewed the data as listed  .  Latest Ref Rng & Units 03/09/2022    9:19 AM 02/09/2022    9:42 AM 01/12/2022    9:07 AM  CBC  WBC 4.0 - 10.5 K/uL 4.5  4.3  4.5   Hemoglobin 12.0 - 15.0 g/dL 14.3  14.0  13.9   Hematocrit 36.0 - 46.0 % 40.2  40.0  40.1   Platelets 150 - 400 K/uL 246  254  246     .    Latest Ref Rng & Units 03/09/2022    9:19 AM 02/09/2022    9:42 AM 01/12/2022    9:07 AM  CMP  Glucose 70 - 99 mg/dL 93  104  96   BUN 8 - 23 mg/dL '15  15  16   '$ Creatinine 0.44 - 1.00 mg/dL 0.86  0.90  0.84   Sodium 135 - 145 mmol/L 140  138  140   Potassium 3.5 - 5.1 mmol/L 3.9  4.1  4.1   Chloride 98 - 111 mmol/L 108  105  106   CO2 22 - 32 mmol/L '26  26  28   '$ Calcium 8.9 - 10.3 mg/dL 9.1  9.4  9.6   Total Protein 6.5 - 8.1 g/dL 7.5  7.3  7.1   Total Bilirubin 0.3 - 1.2 mg/dL 0.8  0.8  0.7   Alkaline Phos 38 - 126 U/L 71  90  86   AST 15 - 41 U/L '22  18  19   '$ ALT 0 - 44 U/L '17  12  14    '$ . Lab Results  Component Value Date   LDH 139 03/09/2022     RADIOGRAPHIC STUDIES: I have personally reviewed the radiological images as listed and agreed with the findings in the report. No results found.   ASSESSMENT & PLAN:   71 yo with   1) CTCL with previous treatment as noted above. Mycosis fungoides with Sezary syndrome 2) grade 1 through 2 skin reaction from mogamulizumab controlled with steroids. PLAN: -- Patient outside labs from [facility] were reviewed and are stable Recent systolic analysis showed 3% Sezary count. -She has been discussion with her physicians at [facility] regarding next steps in systemic therapy sinc -Continue  following up with Dr. Lanier Ensign and Dr. Irish Elders at [facility] -Return to clinic with Dr. Irene Limbo as needed  FOLLOW UP: Patient will contact us regarding treatment option she chooses and to determine if that treatment would be possible at our cancer center or if she would need to get that at [facility]  The total time spent in the appointment was any minutes*.  All of the patient's questions were answered with apparent satisfaction. The patient knows to call the clinic with any problems, questions or concerns.   Sullivan Lone MD MS AAHIVMS [name] Hematology/Oncology Physician [facility]  .*Total Encounter Time as defined by the Centers for Medicare and Medicaid Services includes, in addition to the face-to-face time of a patient visit (documented in the note above) non-face-to-face time: obtaining and reviewing outside history, ordering and reviewing medications, tests or procedures, care coordination (communications with other health care professionals or caregivers) and documentation in the medical record.

## 2022-08-18 ENCOUNTER — Other Ambulatory Visit: Payer: Self-pay

## 2022-08-18 DIAGNOSIS — C8408 Mycosis fungoides, lymph nodes of multiple sites: Secondary | ICD-10-CM

## 2022-08-19 ENCOUNTER — Inpatient Hospital Stay: Payer: Medicare Other | Attending: Hematology

## 2022-08-19 ENCOUNTER — Other Ambulatory Visit: Payer: Self-pay

## 2022-08-19 DIAGNOSIS — C84A0 Cutaneous T-cell lymphoma, unspecified, unspecified site: Secondary | ICD-10-CM | POA: Diagnosis present

## 2022-08-19 DIAGNOSIS — C8408 Mycosis fungoides, lymph nodes of multiple sites: Secondary | ICD-10-CM

## 2022-08-19 LAB — CMP (CANCER CENTER ONLY)
ALT: 14 U/L (ref 0–44)
AST: 18 U/L (ref 15–41)
Albumin: 4.4 g/dL (ref 3.5–5.0)
Alkaline Phosphatase: 79 U/L (ref 38–126)
Anion gap: 7 (ref 5–15)
BUN: 19 mg/dL (ref 8–23)
CO2: 28 mmol/L (ref 22–32)
Calcium: 9.6 mg/dL (ref 8.9–10.3)
Chloride: 105 mmol/L (ref 98–111)
Creatinine: 0.95 mg/dL (ref 0.44–1.00)
GFR, Estimated: >60 mL/min (ref >60)
Glucose, Bld: 103 mg/dL — ABNORMAL HIGH (ref 70–99)
Potassium: 4.4 mmol/L (ref 3.5–5.1)
Sodium: 140 mmol/L (ref 135–145)
Total Bilirubin: 0.6 mg/dL (ref 0.3–1.2)
Total Protein: 7.6 g/dL (ref 6.5–8.1)

## 2022-08-19 LAB — CBC WITH DIFFERENTIAL (CANCER CENTER ONLY)
Abs Immature Granulocytes: 0.01 10*3/uL (ref 0.00–0.07)
Basophils Absolute: 0.1 10*3/uL (ref 0.0–0.1)
Basophils Relative: 1 %
Eosinophils Absolute: 0.1 10*3/uL (ref 0.0–0.5)
Eosinophils Relative: 2 %
HCT: 41.8 % (ref 36.0–46.0)
Hemoglobin: 14.6 g/dL (ref 12.0–15.0)
Immature Granulocytes: 0 %
Lymphocytes Relative: 21 %
Lymphs Abs: 1.2 10*3/uL (ref 0.7–4.0)
MCH: 32.6 pg (ref 26.0–34.0)
MCHC: 34.9 g/dL (ref 30.0–36.0)
MCV: 93.3 fL (ref 80.0–100.0)
Monocytes Absolute: 0.5 10*3/uL (ref 0.1–1.0)
Monocytes Relative: 8 %
Neutro Abs: 4 10*3/uL (ref 1.7–7.7)
Neutrophils Relative %: 68 %
Platelet Count: 243 10*3/uL (ref 150–400)
RBC: 4.48 MIL/uL (ref 3.87–5.11)
RDW: 11.6 % (ref 11.5–15.5)
WBC Count: 5.9 10*3/uL (ref 4.0–10.5)
nRBC: 0 % (ref 0.0–0.2)

## 2022-08-19 LAB — CK: Total CK: 45 U/L (ref 38–234)

## 2022-08-19 LAB — LACTATE DEHYDROGENASE: LDH: 127 U/L (ref 98–192)

## 2022-08-19 LAB — C-REACTIVE PROTEIN: CRP: 0.5 mg/dL (ref <1.0)

## 2022-08-24 ENCOUNTER — Other Ambulatory Visit: Payer: Self-pay

## 2022-08-24 ENCOUNTER — Encounter: Payer: Self-pay | Admitting: Hematology

## 2022-08-24 DIAGNOSIS — C8408 Mycosis fungoides, lymph nodes of multiple sites: Secondary | ICD-10-CM

## 2022-08-25 ENCOUNTER — Other Ambulatory Visit: Payer: Self-pay

## 2022-08-25 ENCOUNTER — Inpatient Hospital Stay: Payer: Medicare Other

## 2022-08-25 DIAGNOSIS — C84A0 Cutaneous T-cell lymphoma, unspecified, unspecified site: Secondary | ICD-10-CM | POA: Diagnosis not present

## 2022-08-25 DIAGNOSIS — C8408 Mycosis fungoides, lymph nodes of multiple sites: Secondary | ICD-10-CM

## 2022-08-25 LAB — SEDIMENTATION RATE: Sed Rate: 13 mm/hr (ref 0–22)

## 2022-09-13 ENCOUNTER — Other Ambulatory Visit: Payer: Self-pay

## 2022-09-13 ENCOUNTER — Encounter: Payer: Self-pay | Admitting: Hematology

## 2022-09-13 DIAGNOSIS — C8408 Mycosis fungoides, lymph nodes of multiple sites: Secondary | ICD-10-CM

## 2022-09-19 ENCOUNTER — Inpatient Hospital Stay: Payer: Medicare Other | Attending: Hematology

## 2022-09-19 DIAGNOSIS — C84A0 Cutaneous T-cell lymphoma, unspecified, unspecified site: Secondary | ICD-10-CM | POA: Diagnosis present

## 2022-09-19 DIAGNOSIS — C8408 Mycosis fungoides, lymph nodes of multiple sites: Secondary | ICD-10-CM

## 2022-09-19 LAB — CBC WITH DIFFERENTIAL (CANCER CENTER ONLY)
Abs Immature Granulocytes: 0.01 10*3/uL (ref 0.00–0.07)
Basophils Absolute: 0 10*3/uL (ref 0.0–0.1)
Basophils Relative: 0 %
Eosinophils Absolute: 0.1 10*3/uL (ref 0.0–0.5)
Eosinophils Relative: 4 %
HCT: 38.3 % (ref 36.0–46.0)
Hemoglobin: 13.7 g/dL (ref 12.0–15.0)
Immature Granulocytes: 0 %
Lymphocytes Relative: 34 %
Lymphs Abs: 0.9 10*3/uL (ref 0.7–4.0)
MCH: 32.4 pg (ref 26.0–34.0)
MCHC: 35.8 g/dL (ref 30.0–36.0)
MCV: 90.5 fL (ref 80.0–100.0)
Monocytes Absolute: 0.3 10*3/uL (ref 0.1–1.0)
Monocytes Relative: 13 %
Neutro Abs: 1.2 10*3/uL — ABNORMAL LOW (ref 1.7–7.7)
Neutrophils Relative %: 49 %
Platelet Count: 154 10*3/uL (ref 150–400)
RBC: 4.23 MIL/uL (ref 3.87–5.11)
RDW: 11.8 % (ref 11.5–15.5)
WBC Count: 2.5 10*3/uL — ABNORMAL LOW (ref 4.0–10.5)
nRBC: 0 % (ref 0.0–0.2)

## 2022-09-19 LAB — CMP (CANCER CENTER ONLY)
ALT: 23 U/L (ref 0–44)
AST: 31 U/L (ref 15–41)
Albumin: 4.3 g/dL (ref 3.5–5.0)
Alkaline Phosphatase: 92 U/L (ref 38–126)
Anion gap: 6 (ref 5–15)
BUN: 15 mg/dL (ref 8–23)
CO2: 26 mmol/L (ref 22–32)
Calcium: 8.7 mg/dL — ABNORMAL LOW (ref 8.9–10.3)
Chloride: 105 mmol/L (ref 98–111)
Creatinine: 0.89 mg/dL (ref 0.44–1.00)
GFR, Estimated: >60 mL/min (ref >60)
Glucose, Bld: 97 mg/dL (ref 70–99)
Potassium: 4 mmol/L (ref 3.5–5.1)
Sodium: 137 mmol/L (ref 135–145)
Total Bilirubin: 0.6 mg/dL (ref 0.3–1.2)
Total Protein: 7.4 g/dL (ref 6.5–8.1)

## 2022-10-25 ENCOUNTER — Other Ambulatory Visit: Payer: Self-pay

## 2022-11-09 ENCOUNTER — Ambulatory Visit: Payer: 59 | Admitting: Cardiology

## 2022-11-09 ENCOUNTER — Other Ambulatory Visit: Payer: Self-pay

## 2022-11-09 ENCOUNTER — Encounter (HOSPITAL_BASED_OUTPATIENT_CLINIC_OR_DEPARTMENT_OTHER): Payer: Self-pay | Admitting: Obstetrics & Gynecology

## 2022-11-09 ENCOUNTER — Telehealth: Payer: Self-pay

## 2022-11-09 ENCOUNTER — Encounter: Payer: Self-pay | Admitting: Hematology

## 2022-11-09 DIAGNOSIS — C8408 Mycosis fungoides, lymph nodes of multiple sites: Secondary | ICD-10-CM

## 2022-11-09 NOTE — Telephone Encounter (Signed)
Contacted pt to make lab appointment. Pt aware of date and time.

## 2022-11-10 ENCOUNTER — Other Ambulatory Visit: Payer: Self-pay

## 2022-11-11 ENCOUNTER — Encounter: Payer: Self-pay | Admitting: Cardiology

## 2022-11-11 ENCOUNTER — Ambulatory Visit: Payer: Medicare Other | Admitting: Cardiology

## 2022-11-11 ENCOUNTER — Other Ambulatory Visit: Payer: Self-pay

## 2022-11-11 VITALS — BP 128/80 | HR 73 | Resp 16 | Ht 63.0 in | Wt 140.0 lb

## 2022-11-11 DIAGNOSIS — I2510 Atherosclerotic heart disease of native coronary artery without angina pectoris: Secondary | ICD-10-CM

## 2022-11-11 DIAGNOSIS — I10 Essential (primary) hypertension: Secondary | ICD-10-CM

## 2022-11-11 DIAGNOSIS — E782 Mixed hyperlipidemia: Secondary | ICD-10-CM

## 2022-11-11 DIAGNOSIS — C8590 Non-Hodgkin lymphoma, unspecified, unspecified site: Secondary | ICD-10-CM

## 2022-11-11 DIAGNOSIS — R002 Palpitations: Secondary | ICD-10-CM

## 2022-11-11 NOTE — Progress Notes (Signed)
ID:  [place], [place] 1951-02-15, MRN [id number]  PCP:  Chesley Noon, MD  Cardiologist:  Rex Kras, DO, [facility]  Former Cardiology Providers: Lawerance Cruel, PA  Date: 11/11/22 Last Office Visit: 05/09/2022  Chief Complaint  Patient presents with   Follow-up    6 months Coronary calcification    HPI  Alexa [name] is a 72 y.o. Caucasian female whose past medical history and cardiovascular risk factors include: Coronary artery calcification (34.3 AU, 63rd percentile), hypertension, hyperlipidemia, palpitations, mycosis fungoides, T-cell lymphoma, and colorectal cancer, family history of premature coronary artery disease.   Patient presents today for 53-monthfollow-up visit.  Doing well from a cardiovascular standpoint.  Denies anginal discomfort or heart failure symptoms.  Palpitations are well-controlled on the current dose of metoprolol.  Denies any near-syncope or syncopal events.  Given her coronary artery calcification she is currently on Zetia.  Initially taking it 3 times a week.  She [facility]  The medication she is considering is called back starting the side effect profile includes hyperlipidemia, hypothyroidism, amongst others.  Patient is willing to uptitrate Zetia to once a day and [facility]  ALLERGIES: Allergies  Allergen Reactions   Ciprofloxacin Other (See Comments)    Gluteus medius tear   Diphenhydramine Hcl     Other reaction(s): Other (See Comments) Pt becomes extremely hyper; pt requested premed for Mogamulizumab per NP   Sudafed [Pseudoephedrine Hcl] Palpitations    MEDICATION LIST PRIOR TO VISIT: Current Meds  Medication Sig   B Complex Vitamins (VITAMIN B COMPLEX) TABS Take by mouth. 5000 UT   Biotin 5000 MCG CAPS    cetirizine (ZYRTEC) 10 MG tablet Take 10 mg by mouth daily.   Cholecalciferol (VITAMIN D) 125 MCG (5000  UT) CAPS    Cod Liver Oil 1000 MG CAPS Take by mouth.   Cranberry 1000 MG CAPS Take by mouth.   ezetimibe (ZETIA) 10 MG tablet Take 1 tablet (10 mg total) by mouth daily.   famotidine (PEPCID) 20 MG tablet Take 20 mg by mouth as needed.   metoprolol tartrate (LOPRESSOR) 25 MG tablet Take 0.5 tablets (12.5 mg total) by mouth daily.   PEGASYS 180 MCG/ML injection Inject 180 mcg into the skin every 7 (seven) days.   Probiotic Product (PROBIOTIC DAILY PO) Take by mouth.   tacrolimus (PROTOPIC) 0.1 % ointment    triamcinolone cream (KENALOG) 0.1 %    vitamin k 100 MCG tablet Take 100 mcg by mouth daily.     PAST MEDICAL HISTORY: Past Medical History:  Diagnosis Date   Adenomatous polyp    [place]    cutaneous T-cell lymphoma; [facility]  Family history of anesthesia complication    sister and sister's children postitive for MH!!!!!!, pt tested negative 3-466yrago   GERD (gastroesophageal reflux disease)    History of hypertension    off medication after weight loss   Hyperlipemia    [place]   light [place] therapy   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 [place]   Osteopenia    bilateral hips   [place]    PAST SURGICAL HISTORY: Past Surgical History:  Procedure Laterality Date   BREAST BIOPSY     COLONOSCOPY  03/07/2012   with biopsy   DILATATION & CURRETTAGE/HYSTEROSCOPY WITH RESECTOCOPE N/A 06/02/2014   Procedure: DILATATION & CURETTAGE/HYSTEROSCOPY WITH RESECTOCOPE;  Surgeon: Lyman Speller, MD;  [facility]: [facility];  Service: Gynecology;  Laterality: N/A;   FOOT SURGERY     Right    TRANSANAL EXCISION OF RECTAL MASS N/A 06/09/2017   Procedure: TRANSANAL EXCISION OF RECTAL MASS;  Surgeon: [name], MD;  Location: [place];  Service: General;  Laterality: N/A;    FAMILY HISTORY: The patient family history includes Anuerysm in her father; Cancer in her brother and father; Diabetes in her father; Heart attack in her  brother, mother, and nephew; Heart disease in her father and mother; Hypertension in her father, mother, and sister; Kidney cancer in her mother; Kidney disease in her mother; Melanoma in her brother; Other in her brother; Ovarian cancer in her paternal grandmother.  SOCIAL HISTORY:  The patient  reports that she has never smoked. She has never used smokeless tobacco. She reports that she does not currently use alcohol. She reports that she does not use drugs.  REVIEW OF SYSTEMS: Review of Systems  Cardiovascular:  Negative for chest pain, claudication, dyspnea on exertion, irregular heartbeat, leg swelling, near-syncope, orthopnea, palpitations, paroxysmal nocturnal dyspnea and syncope.  Respiratory:  Negative for shortness of breath.   Hematologic/Lymphatic: Negative for bleeding problem.  Musculoskeletal:  Negative for muscle cramps and myalgias.  Neurological:  Negative for dizziness and light-headedness.   PHYSICAL EXAM:    11/11/2022   12:57 PM 07/27/2022   12:38 PM 05/26/2022    1:19 PM  Vitals with BMI  Height '5\' 3"'$  '5\' 3"'$  '5\' 3"'$   Weight 140 lbs 141 lbs 8 oz 144 lbs  BMI 24.81 40.98 11.91  Systolic 478 295 621  Diastolic 80 73 74  Pulse 73 66 67    Physical Exam  Constitutional: No distress.  Age appropriate, hemodynamically stable.   Neck: No JVD present.  Cardiovascular: Normal rate, regular rhythm, S1 normal, S2 normal, intact distal pulses and normal pulses. Exam reveals no gallop, no S3 and no S4.  No murmur heard. Pulmonary/Chest: Effort normal and breath sounds normal. No stridor. She has no wheezes. She has no rales.  Abdominal: Soft. Bowel sounds are normal. She exhibits no distension. There is no abdominal tenderness.  Musculoskeletal:        General: No edema.     Cervical back: Neck supple.  Neurological: She is alert and oriented to person, place, and time. She has intact cranial nerves (2-12).  Skin: Skin is warm and moist.   CARDIAC  DATABASE: EKG: 11/11/2022: Normal sinus rhythm, 61 bpm, without underlying injury pattern, rare PVC.  Echocardiogram:  02/09/2017: 1.  Left ventricle cavity is normal in size.  Normal global wall motion.  Normal diastolic filling pattern.  Normal LAP.  Calculated EF 50% 2.  Trace mitral regurgitation. 3.  Trace tricuspid regurgitation.  No evidence of pulmonary hypertension   Stress Testing: Stress test 02/13/2017: Resting EKG demonstrates normal sinus rhythm.  The patient exercised according to Bruce protocol, total time recorded 9 minutes achieving maximum heart rate of 169 which was 107% of target heart rate for age and 10.16 METS of work.  Stress terminated due to fatigue, THR met.  Hypertensive blood pressure response.  There was no ST-T changes of ischemia with exercise stress test.  There were no significant arrhythmias.  Normal blood pressure response. No evidence of ischemia by GXT.  Exercise tolerance is normal.  Hypertensive blood pressure response.  Continue preventive therapy  Heart Catheterization: None  Bilateral carotid artery duplex 01/30/2017: Minimal amount of bilateral  intimal thickening and atherosclerotic plaque, right greater than left, not resulting in a hemodynamically significant stenosis within either internal carotid artery.  LONG TERM MONITOR 10/07/2020-10/21/2020:  Patient had minimum heart rate of 46 bpm, maximum heart rate of 23 bpm with an average heart rate of 73 bpm.  Predominant rhythm was sinus.  There were 12 patient triggered events which primarily correlated with sinus rhythm, and occasional ventricular ectopy.  Patient did have symptomatic supraventricular tachycardia with the fastest interval lasting 7 beats at a maximum rate of 203 bpm, longest lasting 8 beats with an average heart rate of 93 bpm.  Rare PVCs and PACs.  Patient also with several brief episodes of atrial tachycardia.  CT Cardiac Scoring: 06/28/2021 Left Main: 0 LAD: 34.3 LCx: 0  RCA:  0 Total CAC 34.3 AU, 63rd percentile for patient's age, sex, and race.  LABORATORY DATA:    Latest Ref Rng & Units 09/19/2022    9:21 AM 08/19/2022    9:00 AM 03/09/2022    9:19 AM  CBC  WBC 4.0 - 10.5 K/uL 2.5  5.9  4.5   Hemoglobin 12.0 - 15.0 g/dL 13.7  14.6  14.3   Hematocrit 36.0 - 46.0 % 38.3  41.8  40.2   Platelets 150 - 400 K/uL 154  243  246        Latest Ref Rng & Units 09/19/2022    9:21 AM 08/19/2022    9:00 AM 03/09/2022    9:19 AM  CMP  Glucose 70 - 99 mg/dL 97  103  93   BUN 8 - 23 mg/dL '15  19  15   '$ Creatinine 0.44 - 1.00 mg/dL 0.89  0.95  0.86   Sodium 135 - 145 mmol/L 137  140  140   Potassium 3.5 - 5.1 mmol/L 4.0  4.4  3.9   Chloride 98 - 111 mmol/L 105  105  108   CO2 22 - 32 mmol/L '26  28  26   '$ Calcium 8.9 - 10.3 mg/dL 8.7  9.6  9.1   Total Protein 6.5 - 8.1 g/dL 7.4  7.6  7.5   Total Bilirubin 0.3 - 1.2 mg/dL 0.6  0.6  0.8   Alkaline Phos 38 - 126 U/L 92  79  71   AST 15 - 41 U/L '31  18  22   '$ ALT 0 - 44 U/L '23  14  17     '$ Lipid Panel  No results found for: "CHOL", "TRIG", "HDL", "CHOLHDL", "VLDL", "LDLCALC", "LDLDIRECT", "LABVLDL"  No components found for: "NTPROBNP" No results for input(s): "PROBNP" in the last 8760 hours. No results for input(s): "TSH" in the last 8760 hours.  BMP Recent Labs    03/09/22 0919 08/19/22 0900 09/19/22 0921  NA 140 140 137  K 3.9 4.4 4.0  CL 108 105 105  CO2 '26 28 26  '$ GLUCOSE 93 103* 97  BUN '15 19 15  '$ CREATININE 0.86 0.95 0.89  CALCIUM 9.1 9.6 8.7*  GFRNONAA >60 >60 >60    HEMOGLOBIN A1C No results found for: "HGBA1C", "MPG"  External Labs: Collected: 10/18/2022 performed at [facility] available in [place]. White count 3.5, hemoglobin 14.3, hematocrit 41.9%, Platelets 188 Sodium 138, potassium 4.4, chloride 103, bicarb 26, BUN 14, creatinine 0.9. AST 33, ALT 24, alkaline phosphatase 100 TSH 0.79 Total cholesterol 189, triglycerides 121, HDL 51, LDL calculated 114,  IMPRESSION:     ICD-10-CM   1. Palpitations  R00.2 EKG 12-Lead  2. Coronary atherosclerosis due to calcified coronary lesion  I25.10 ECHOCARDIOGRAM COMPLETE   I25.84 PCV MYOCARDIAL PERFUSION WO LEXISCAN    3. Mixed hyperlipidemia  E78.2     4. Hypertension, essential  I10     5. T-cell lymphoma ([place])  C85.90 ECHOCARDIOGRAM COMPLETE       RECOMMENDATIONS: Louann is a 72 y.o. Caucasian female whose past medical history and cardiac risk factors include: Coronary artery calcification (34.3 AU, 63rd percentile), hypertension, hyperlipidemia, palpitations, mycosis fungoides, T-cell lymphoma, and colorectal cancer, family history of premature coronary artery disease.   Palpitations Well-controlled on current dose of metoprolol. Remains asymptomatic.  Monitor for now.  Coronary atherosclerosis due to calcified coronary lesion Echo will be ordered to evaluate for structural heart disease and left ventricular systolic function. Exercise nuclear stress test to evaluate for functional capacity and reversible ischemia. Patient to consider ASA '81mg'$  po qday.  Currently on Zetia.  Plans to take Zetia every day as opposed to 3 times a day. Will continue to follow her lipids with her oncologist at [facility]. If lipids continue to uptrend will consider transitioning her from [place] to [place]. She still remains hesitant with regards to statin medications. Close monitoring  Mixed hyperlipidemia See above  Hypertension, essential Office blood pressures are well-controlled controlled on current therapy  Given her cutaneous T-cell lymphoma and planning to initiate chemotherapy shared decision was to repeat an echocardiogram with strain imaging.  Her last echo from 2018 reviewed and noted above for further reference.   FINAL MEDICATION LIST END OF ENCOUNTER: No orders of the defined types were placed in this encounter.   Medications Discontinued During This Encounter  Medication Reason   ELDERBERRY  PO Patient Preference   predniSONE (DELTASONE) 50 MG tablet Patient Preference   sodium chloride 0.9 % SOLN 250 mL with mogamulizumab-kpkc 20 MG/5ML SOLN 1 mg/kg Completed Course     Current Outpatient Medications:    B Complex Vitamins (VITAMIN B COMPLEX) TABS, Take by mouth. 5000 UT, Disp: , Rfl:    Biotin 5000 MCG CAPS, , Disp: , Rfl:    cetirizine (ZYRTEC) 10 MG tablet, Take 10 mg by mouth daily., Disp: , Rfl:    Cholecalciferol (VITAMIN D) 125 MCG (5000 UT) CAPS, , Disp: , Rfl:    Cod Liver Oil 1000 MG CAPS, Take by mouth., Disp: , Rfl:    Cranberry 1000 MG CAPS, Take by mouth., Disp: , Rfl:    ezetimibe (ZETIA) 10 MG tablet, Take 1 tablet (10 mg total) by mouth daily., Disp: 90 tablet, Rfl: 1   famotidine (PEPCID) 20 MG tablet, Take 20 mg by mouth as needed., Disp: , Rfl:    metoprolol tartrate (LOPRESSOR) 25 MG tablet, Take 0.5 tablets (12.5 mg total) by mouth daily., Disp: , Rfl:    PEGASYS 180 MCG/ML injection, Inject 180 mcg into the skin every 7 (seven) days., Disp: , Rfl:    Probiotic Product (PROBIOTIC DAILY PO), Take by mouth., Disp: , Rfl:    tacrolimus (PROTOPIC) 0.1 % ointment, , Disp: , Rfl:    triamcinolone cream (KENALOG) 0.1 %, , Disp: , Rfl:    vitamin k 100 MCG tablet, Take 100 mcg by mouth daily., Disp: , Rfl:    amLODipine (NORVASC) 5 MG tablet, Take 1 tablet (5 mg total) by mouth daily., Disp: 90 tablet, Rfl: 3   bexarotene (TARGRETIN) 75 MG CAPS capsule, Take 75 mg by mouth daily. (Patient not taking: Reported on 11/11/2022), Disp: , Rfl:  Orders Placed This Encounter  Procedures   PCV MYOCARDIAL PERFUSION WO LEXISCAN   EKG 12-Lead   ECHOCARDIOGRAM COMPLETE    There are no Patient Instructions on file for this visit.   --Continue cardiac medications as reconciled in final medication list. --Return in about 4 weeks (around 12/09/2022) for Follow up CAC , Review test results. or sooner if needed. --Continue follow-up with your primary care physician regarding  the management of your other chronic comorbid conditions.  Patient's questions and concerns were addressed to her satisfaction. She voices understanding of the instructions provided during this encounter.   This note was created using a voice recognition software as a result there may be grammatical errors inadvertently enclosed that do not reflect the nature of this encounter. Every attempt is made to correct such errors.  Rex Kras, [place], [facility]  Pager: [phone] Office: [phone]

## 2022-11-13 ENCOUNTER — Other Ambulatory Visit: Payer: Self-pay

## 2022-11-18 ENCOUNTER — Inpatient Hospital Stay: Payer: Medicare Other | Attending: Hematology

## 2022-11-18 DIAGNOSIS — C8408 Mycosis fungoides, lymph nodes of multiple sites: Secondary | ICD-10-CM | POA: Insufficient documentation

## 2022-11-18 LAB — CBC WITH DIFFERENTIAL (CANCER CENTER ONLY)
Abs Immature Granulocytes: 0 10*3/uL (ref 0.00–0.07)
Basophils Absolute: 0 10*3/uL (ref 0.0–0.1)
Basophils Relative: 1 %
Eosinophils Absolute: 0.1 10*3/uL (ref 0.0–0.5)
Eosinophils Relative: 4 %
HCT: 37.6 % (ref 36.0–46.0)
Hemoglobin: 13.8 g/dL (ref 12.0–15.0)
Immature Granulocytes: 0 %
Lymphocytes Relative: 35 %
Lymphs Abs: 0.8 10*3/uL (ref 0.7–4.0)
MCH: 33.3 pg (ref 26.0–34.0)
MCHC: 36.7 g/dL — ABNORMAL HIGH (ref 30.0–36.0)
MCV: 90.8 fL (ref 80.0–100.0)
Monocytes Absolute: 0.3 10*3/uL (ref 0.1–1.0)
Monocytes Relative: 15 %
Neutro Abs: 1 10*3/uL — ABNORMAL LOW (ref 1.7–7.7)
Neutrophils Relative %: 45 %
Platelet Count: 153 10*3/uL (ref 150–400)
RBC: 4.14 MIL/uL (ref 3.87–5.11)
RDW: 12.3 % (ref 11.5–15.5)
WBC Count: 2.2 10*3/uL — ABNORMAL LOW (ref 4.0–10.5)
nRBC: 0 % (ref 0.0–0.2)

## 2022-11-18 LAB — CMP (CANCER CENTER ONLY)
ALT: 25 U/L (ref 0–44)
AST: 35 U/L (ref 15–41)
Albumin: 4 g/dL (ref 3.5–5.0)
Alkaline Phosphatase: 89 U/L (ref 38–126)
Anion gap: 6 (ref 5–15)
BUN: 18 mg/dL (ref 8–23)
CO2: 26 mmol/L (ref 22–32)
Calcium: 8.9 mg/dL (ref 8.9–10.3)
Chloride: 107 mmol/L (ref 98–111)
Creatinine: 0.77 mg/dL (ref 0.44–1.00)
GFR, Estimated: >60 mL/min (ref >60)
Glucose, Bld: 86 mg/dL (ref 70–99)
Potassium: 4.2 mmol/L (ref 3.5–5.1)
Sodium: 139 mmol/L (ref 135–145)
Total Bilirubin: 0.6 mg/dL (ref 0.3–1.2)
Total Protein: 7.3 g/dL (ref 6.5–8.1)

## 2022-11-23 ENCOUNTER — Ambulatory Visit (INDEPENDENT_AMBULATORY_CARE_PROVIDER_SITE_OTHER): Payer: Medicare Other | Admitting: Obstetrics & Gynecology

## 2022-11-23 ENCOUNTER — Ambulatory Visit (INDEPENDENT_AMBULATORY_CARE_PROVIDER_SITE_OTHER): Payer: Medicare Other

## 2022-11-23 ENCOUNTER — Encounter (HOSPITAL_BASED_OUTPATIENT_CLINIC_OR_DEPARTMENT_OTHER): Payer: Self-pay | Admitting: Obstetrics & Gynecology

## 2022-11-23 ENCOUNTER — Other Ambulatory Visit (HOSPITAL_BASED_OUTPATIENT_CLINIC_OR_DEPARTMENT_OTHER): Payer: Self-pay | Admitting: Obstetrics & Gynecology

## 2022-11-23 VITALS — BP 132/78 | HR 76 | Ht 63.5 in | Wt 141.6 lb

## 2022-11-23 DIAGNOSIS — N83201 Unspecified ovarian cyst, right side: Secondary | ICD-10-CM

## 2022-11-23 DIAGNOSIS — I2510 Atherosclerotic heart disease of native coronary artery without angina pectoris: Secondary | ICD-10-CM

## 2022-11-23 DIAGNOSIS — R109 Unspecified abdominal pain: Secondary | ICD-10-CM | POA: Diagnosis not present

## 2022-11-23 DIAGNOSIS — R140 Abdominal distension (gaseous): Secondary | ICD-10-CM | POA: Diagnosis not present

## 2022-11-23 DIAGNOSIS — I2584 Coronary atherosclerosis due to calcified coronary lesion: Secondary | ICD-10-CM

## 2022-11-24 ENCOUNTER — Other Ambulatory Visit: Payer: Self-pay

## 2022-11-24 MED ORDER — EZETIMIBE 10 MG PO TABS: 10.0000 mg | ORAL_TABLET | Freq: Every day | ORAL | 3 refills | Status: DC

## 2022-11-25 ENCOUNTER — Ambulatory Visit (HOSPITAL_COMMUNITY)
Admission: RE | Admit: 2022-11-25 | Discharge: 2022-11-25 | Disposition: A | Discharge status: Home / Self Care | Payer: Medicare Other | Source: Ambulatory Visit | Attending: Cardiology | Admitting: Cardiology

## 2022-11-25 DIAGNOSIS — I2510 Atherosclerotic heart disease of native coronary artery without angina pectoris: Secondary | ICD-10-CM | POA: Diagnosis present

## 2022-11-25 DIAGNOSIS — E785 Hyperlipidemia, unspecified: Secondary | ICD-10-CM | POA: Diagnosis not present

## 2022-11-25 DIAGNOSIS — C8590 Non-Hodgkin lymphoma, unspecified, unspecified site: Secondary | ICD-10-CM | POA: Diagnosis not present

## 2022-11-25 DIAGNOSIS — I2584 Coronary atherosclerosis due to calcified coronary lesion: Secondary | ICD-10-CM | POA: Diagnosis not present

## 2022-11-26 NOTE — Progress Notes (Signed)
GYNECOLOGY  VISIT  CC:   follow up after ultrasound  HPI: 72 y.o. G1P0 Single White or Caucasian female here for discussion of ultrasound results.  Pt has been having some GI symptoms and has some concerns about ovarian cancer risk.  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  Followed at [facility].  Doing well with current/new treatment.  Ultrasound showed small uterus with thin endometrium.  Right ovary was small with 1.3cm cyst.  Pt had ultrasound in 2014 and this was reviewed with pt.  Simple cyst noted then as well and was slightly larger than today.  Reassured pt that this has been present for about 10 years.  Left ovary not clearly seen but no adnexal mass/cyst/fluid noted.    Follow up with any new concerns/symptoms. Discussed.  Pt reassured.   Past Medical History:  Diagnosis Date   Adenomatous polyp    Cancer ([place])    cutaneous T-cell lymphoma; followed by Dr. Clovis Riley @ [facility]   Family history of anesthesia complication    sister and sister's children postitive for MH!!!!!!, pt tested negative 3-58yr ago   GERD (gastroesophageal reflux disease)    History of hypertension    off medication after weight loss   Hyperlipemia    [place]    [place]   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    Osteopenia    bilateral hips   [place]    MEDS:   Current Outpatient Medications on File Prior to Visit  Medication Sig Dispense Refill   amLODipine (NORVASC) 5 MG tablet Take 1 tablet (5 mg total) by mouth daily. 90 tablet 3   B Complex Vitamins (VITAMIN B COMPLEX) TABS Take by mouth. 5000 UT     bexarotene (TARGRETIN) 75 MG CAPS capsule Take 75 mg by mouth daily. (Patient not taking: Reported on 11/11/2022)     Biotin 5000 MCG CAPS      cetirizine (ZYRTEC) 10 MG tablet Take 10 mg by mouth daily.     Cholecalciferol (VITAMIN D) 125 MCG (5000 UT) CAPS      Cod Liver Oil 1000 MG CAPS Take by mouth.     Cranberry 1000 MG CAPS Take by mouth.     famotidine  (PEPCID) 20 MG tablet Take 20 mg by mouth as needed.     metoprolol tartrate (LOPRESSOR) 25 MG tablet Take 0.5 tablets (12.5 mg total) by mouth daily.     PEGASYS 180 MCG/ML injection Inject 180 mcg into the skin every 7 (seven) days.     Probiotic Product (PROBIOTIC DAILY PO) Take by mouth.     tacrolimus (PROTOPIC) 0.1 % ointment      triamcinolone cream (KENALOG) 0.1 %      vitamin k 100 MCG tablet Take 100 mcg by mouth daily.     No current facility-administered medications on file prior to visit.    ALLERGIES: Ciprofloxacin, Diphenhydramine hcl, and Sudafed [pseudoephedrine hcl]  SH:  single, non smoker  Review of Systems  Constitutional: Negative.     PHYSICAL EXAMINATION:    BP 132/78   Pulse 76   Ht 5' 3.5" (1.613 m)   Wt 141 lb 9.6 oz (64.2 kg)   LMP 11/07/2002   BMI 24.69 kg/m     General appearance: alert, cooperative and appears stated age  Assessment/Plan: 1. Abdominal discomfort  2. Right ovarian cyst - cyst present in 2014.  Pt reassured today.  Questions answered.  Follow up recommended if have new symptoms/concerns

## 2022-11-27 DIAGNOSIS — I2510 Atherosclerotic heart disease of native coronary artery without angina pectoris: Secondary | ICD-10-CM | POA: Insufficient documentation

## 2022-11-27 LAB — ECHOCARDIOGRAM COMPLETE
Area-P 1/2: 3.99 cm2
Calc EF: 63 %
S' Lateral: 2.4 cm
Single Plane A2C EF: 57.2 %
Single Plane A4C EF: 71 %

## 2022-11-28 ENCOUNTER — Ambulatory Visit: Payer: Medicare Other

## 2022-11-28 DIAGNOSIS — I2510 Atherosclerotic heart disease of native coronary artery without angina pectoris: Secondary | ICD-10-CM

## 2022-12-10 ENCOUNTER — Encounter: Payer: Self-pay | Admitting: Hematology

## 2022-12-12 ENCOUNTER — Other Ambulatory Visit: Payer: Self-pay

## 2022-12-12 ENCOUNTER — Encounter: Payer: Self-pay | Admitting: Hematology

## 2022-12-12 DIAGNOSIS — C8408 Mycosis fungoides, lymph nodes of multiple sites: Secondary | ICD-10-CM

## 2022-12-12 DIAGNOSIS — C8590 Non-Hodgkin lymphoma, unspecified, unspecified site: Secondary | ICD-10-CM

## 2022-12-12 DIAGNOSIS — E7849 Other hyperlipidemia: Secondary | ICD-10-CM

## 2022-12-23 ENCOUNTER — Inpatient Hospital Stay: Payer: Medicare Other | Attending: Hematology

## 2022-12-23 DIAGNOSIS — Z79899 Other long term (current) drug therapy: Secondary | ICD-10-CM | POA: Insufficient documentation

## 2022-12-23 DIAGNOSIS — C8408 Mycosis fungoides, lymph nodes of multiple sites: Secondary | ICD-10-CM | POA: Diagnosis present

## 2022-12-23 DIAGNOSIS — E7849 Other hyperlipidemia: Secondary | ICD-10-CM

## 2022-12-23 DIAGNOSIS — C8590 Non-Hodgkin lymphoma, unspecified, unspecified site: Secondary | ICD-10-CM

## 2022-12-23 LAB — CMP (CANCER CENTER ONLY)
ALT: 27 U/L (ref 0–44)
AST: 34 U/L (ref 15–41)
Albumin: 4 g/dL (ref 3.5–5.0)
Alkaline Phosphatase: 86 U/L (ref 38–126)
Anion gap: 9 (ref 5–15)
BUN: 14 mg/dL (ref 8–23)
CO2: 24 mmol/L (ref 22–32)
Calcium: 8.8 mg/dL — ABNORMAL LOW (ref 8.9–10.3)
Chloride: 106 mmol/L (ref 98–111)
Creatinine: 0.79 mg/dL (ref 0.44–1.00)
GFR, Estimated: >60 mL/min (ref >60)
Glucose, Bld: 96 mg/dL (ref 70–99)
Potassium: 4.2 mmol/L (ref 3.5–5.1)
Sodium: 139 mmol/L (ref 135–145)
Total Bilirubin: 0.4 mg/dL (ref 0.3–1.2)
Total Protein: 7.1 g/dL (ref 6.5–8.1)

## 2022-12-23 LAB — CBC WITH DIFFERENTIAL (CANCER CENTER ONLY)
Abs Immature Granulocytes: 0 10*3/uL (ref 0.00–0.07)
Basophils Absolute: 0 10*3/uL (ref 0.0–0.1)
Basophils Relative: 0 %
Eosinophils Absolute: 0.2 10*3/uL (ref 0.0–0.5)
Eosinophils Relative: 8 %
HCT: 36.3 % (ref 36.0–46.0)
Hemoglobin: 13.2 g/dL (ref 12.0–15.0)
Immature Granulocytes: 0 %
Lymphocytes Relative: 33 %
Lymphs Abs: 0.8 10*3/uL (ref 0.7–4.0)
MCH: 33.4 pg (ref 26.0–34.0)
MCHC: 36.4 g/dL — ABNORMAL HIGH (ref 30.0–36.0)
MCV: 91.9 fL (ref 80.0–100.0)
Monocytes Absolute: 0.4 10*3/uL (ref 0.1–1.0)
Monocytes Relative: 17 %
Neutro Abs: 1 10*3/uL — ABNORMAL LOW (ref 1.7–7.7)
Neutrophils Relative %: 42 %
Platelet Count: 172 10*3/uL (ref 150–400)
RBC: 3.95 MIL/uL (ref 3.87–5.11)
RDW: 12.6 % (ref 11.5–15.5)
WBC Count: 2.4 10*3/uL — ABNORMAL LOW (ref 4.0–10.5)
nRBC: 0 % (ref 0.0–0.2)

## 2022-12-23 LAB — CHOLESTEROL, TOTAL: Cholesterol: 217 mg/dL — ABNORMAL HIGH (ref 0–200)

## 2022-12-23 LAB — LIPID PANEL
Cholesterol: 216 mg/dL — ABNORMAL HIGH (ref 0–200)
HDL: 49 mg/dL (ref >40)
LDL Cholesterol: 125 mg/dL — ABNORMAL HIGH (ref 0–99)
Total CHOL/HDL Ratio: 4.4 RATIO
Triglycerides: 209 mg/dL — ABNORMAL HIGH (ref <150)
VLDL: 42 mg/dL — ABNORMAL HIGH (ref 0–40)

## 2022-12-24 LAB — T4: T4, Total: 6.3 ug/dL (ref 4.5–12.0)

## 2023-01-02 ENCOUNTER — Encounter: Payer: Self-pay | Admitting: Cardiology

## 2023-01-02 ENCOUNTER — Ambulatory Visit: Payer: Medicare Other | Admitting: Cardiology

## 2023-01-02 VITALS — BP 132/72 | HR 69 | Ht 63.5 in | Wt 135.0 lb

## 2023-01-02 DIAGNOSIS — C8590 Non-Hodgkin lymphoma, unspecified, unspecified site: Secondary | ICD-10-CM

## 2023-01-02 DIAGNOSIS — I2510 Atherosclerotic heart disease of native coronary artery without angina pectoris: Secondary | ICD-10-CM

## 2023-01-02 DIAGNOSIS — E7800 Pure hypercholesterolemia, unspecified: Secondary | ICD-10-CM

## 2023-01-02 DIAGNOSIS — E781 Pure hyperglyceridemia: Secondary | ICD-10-CM

## 2023-01-02 DIAGNOSIS — I10 Essential (primary) hypertension: Secondary | ICD-10-CM

## 2023-01-02 DIAGNOSIS — R002 Palpitations: Secondary | ICD-10-CM

## 2023-01-02 NOTE — Progress Notes (Signed)
ID:  [place], [place] 1951/09/01, MRN [id number]  PCP:  Chesley Noon, MD  Cardiologist:  Rex Kras, DO, [facility]  Former Cardiology Providers: Lawerance Cruel, PA  Date: 01/02/23 Last Office Visit: 11/11/2022  Chief Complaint  Patient presents with   Follow-up    Reviewed test results    HPI  Alexa [name] is a 72 y.o. Caucasian female whose past medical history and cardiovascular risk factors include: Coronary artery calcification (34.3 AU, 63rd percentile), hypertension, hyperlipidemia, palpitations, mycosis fungoides, T-cell lymphoma, and colorectal cancer, family history of premature coronary artery disease.   Patient presents today for 1 month follow-up after undergoing an echo and stress test.  She has a history of palpitations which are currently well-controlled on metoprolol.  At the last office visit she is planning to start therapy for T-cell lymphoma at [facility] and the medications that were being considered would likely cause hyperlipidemia.  Prior to starting therapy for lymphoma shared decision at last office visit was to proceed with echocardiogram and stress test given her coronary artery calcification and other risk factors.  Results of both echo and stress test reviewed with her and noted below for further reference.  Clinically, patient is doing well after starting bexarotene first week of February which she takes on a daily basis.  She is still getting interferon on a weekly basis.  Majority of the treatment is being tailored by her dermatologist at [facility].  Since initiation of these medications her most recent lipid profile has noted hypertriglyceridemia and hyperlipidemia (which was anticipated).  However, there is no initiation of statin medications as of now.  Patient hopes to be on the medication for 1 more month and to reevaluate.  As of the current moment she is getting labs on a monthly basis to check her lipids, thyroid, and other blood  indices.  ALLERGIES: Allergies  Allergen Reactions   Ciprofloxacin Other (See Comments)    Gluteus medius tear  Gluteus medius tear    Muscle tear   Diphenhydramine Hcl     Other reaction(s): Other (See Comments) Pt becomes extremely hyper; pt requested premed for Mogamulizumab per NP   Pseudoephedrine Hcl Palpitations   Sudafed [Pseudoephedrine Hcl] Palpitations    MEDICATION LIST PRIOR TO VISIT: Current Meds  Medication Sig   amLODipine (NORVASC) 5 MG tablet Take 1 tablet (5 mg total) by mouth daily.   B Complex Vitamins (VITAMIN B COMPLEX) TABS Take by mouth. 5000 UT   bexarotene (TARGRETIN) 75 MG CAPS capsule Take 75 mg by mouth daily.   Biotin 5000 MCG CAPS    cetirizine (ZYRTEC) 10 MG tablet Take 10 mg by mouth daily.   Cholecalciferol (VITAMIN D) 125 MCG (5000 UT) CAPS    Cod Liver Oil 1000 MG CAPS Take by mouth.   Cranberry 1000 MG CAPS Take by mouth.   ezetimibe (ZETIA) 10 MG tablet Take 1 tablet (10 mg total) by mouth daily.   famotidine (PEPCID) 20 MG tablet Take 20 mg by mouth as needed.   metoprolol tartrate (LOPRESSOR) 25 MG tablet Take 0.5 tablets (12.5 mg total) by mouth daily.   PEGASYS 180 MCG/ML injection Inject 180 mcg into the skin every 7 (seven) days.   Probiotic Product (PROBIOTIC DAILY PO) Take by mouth.   tacrolimus (PROTOPIC) 0.1 % ointment    triamcinolone cream (KENALOG) 0.1 %    vitamin k 100 MCG tablet Take 100 mcg by mouth daily.     PAST MEDICAL HISTORY: Past Medical History:  Diagnosis Date   Adenomatous polyp    Cancer ([place])    cutaneous T-cell lymphoma; followed by Dr. Clovis Riley @ [facility]   Family history of anesthesia complication    sister and sister's children postitive for MH!!!!!!, pt tested negative 3-32yr ago   GERD (gastroesophageal reflux disease)    History of hypertension    off medication after weight loss   Hyperlipemia    [place]    [place]   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    Osteopenia    bilateral hips    [place]    PAST SURGICAL HISTORY: Past Surgical History:  Procedure Laterality Date   BREAST BIOPSY     COLONOSCOPY  03/07/2012   with biopsy   DILATATION & CURRETTAGE/HYSTEROSCOPY WITH RESECTOCOPE N/A 06/02/2014   Procedure: DILATATION & CURETTAGE/HYSTEROSCOPY WITH RESECTOCOPE;  Surgeon: MLyman Speller MD;  [facility];  Service: Gynecology;  Laterality: N/A;   FOOT SURGERY     Right    TRANSANAL EXCISION OF RECTAL MASS N/A 06/09/2017   Procedure: TRANSANAL EXCISION OF RECTAL MASS;  Surgeon: TLeighton Ruff MD;  [facility]  Service: General;  Laterality: N/A;    FAMILY HISTORY: The patient family history includes Anuerysm in her father; Cancer in her brother and father; Diabetes in her father; Heart attack in her brother, mother, and nephew; Heart disease in her father and mother; Hypertension in her father, mother, and sister; Kidney cancer in her mother; Kidney disease in her mother; Melanoma in her brother; Other in her brother; Ovarian cancer in her paternal grandmother.  SOCIAL HISTORY:  The patient  reports that she has never smoked. She has never used smokeless tobacco. She reports that she does not currently use alcohol. She reports that she does not use drugs.  REVIEW OF SYSTEMS: Review of Systems  Cardiovascular:  Negative for chest pain, claudication, dyspnea on exertion, irregular heartbeat, leg swelling, near-syncope, orthopnea, palpitations, paroxysmal nocturnal dyspnea and syncope.  Respiratory:  Negative for shortness of breath.   Hematologic/Lymphatic: Negative for bleeding problem.  Musculoskeletal:  Negative for muscle cramps and myalgias.  Neurological:  Negative for dizziness and light-headedness.   PHYSICAL EXAM:    01/02/2023    2:01 PM 11/23/2022    4:56 PM 11/23/2022    4:32 PM  Vitals with BMI  Height 5' 3.5"  5' 3.5"  Weight 135 lbs  141 lbs 10 oz  BMI [id number] Systolic [id number]  Diastolic 72 78 85  Pulse 69  76    Physical Exam  Constitutional: No distress.  Age appropriate, hemodynamically stable.   Neck: No JVD present.  Cardiovascular: Normal rate, regular rhythm, S1 normal, S2 normal, intact distal pulses and normal pulses. Exam reveals no gallop, no S3 and no S4.  No murmur heard. Pulmonary/Chest: Effort normal and breath sounds normal. No stridor. She has no wheezes. She has no rales.  Abdominal: Soft. Bowel sounds are normal. She exhibits no distension. There is no abdominal tenderness.  Musculoskeletal:        General: No edema.     Cervical back: Neck supple.  Neurological: She is alert and oriented to person, place, and time. She has intact cranial nerves (2-12).  Skin: Skin is warm and moist. Rash noted.   CARDIAC DATABASE: EKG: 11/11/2022: Normal sinus rhythm, 61 bpm, without underlying injury pattern, rare PVC.  Echocardiogram: 11/25/2022 1. Left ventricular ejection fraction, by estimation, is 60 to 65%. The  left ventricle has normal function. The left ventricle has no regional wall motion abnormalities. Left ventricular diastolic parameters were normal. The average left ventricular  global longitudinal strain is -22.0 %. The global longitudinal strain is normal. 2. Right ventricular systolic function is normal. The right ventricular size is normal. 3. The mitral valve is normal in structure. No evidence of mitral valve regurgitation. No evidence of mitral stenosis. 4. The aortic valve is normal in structure. Aortic valve regurgitation is not visualized. No aortic stenosis is present. 5. Rhythm strip during this exam demonstrates normal sinus rhythm.  Comparison(s): Outside echo 02/09/2017 reported LVEF 50%, trace MR and TR.   Stress Testing: Exercise nuclear stress test 11/28/2022 Myocardial perfusion is normal. Overall LV systolic function is normal without regional wall motion abnormalities. Stress LV EF: 73%. Low risk study. Normal ECG  stress. The patient exercised for 9 minutes and 59 seconds of a Bruce protocol, achieving approximately 6.6 METs and 103% MPHR. The heart rate response was normal. The blood pressure response was normal. No previous exam available for comparison.   Heart Catheterization: None  Bilateral carotid artery duplex 01/30/2017: Minimal amount of bilateral intimal thickening and atherosclerotic plaque, right greater than left, not resulting in a hemodynamically significant stenosis within either internal carotid artery.  LONG TERM MONITOR 10/07/2020-10/21/2020:  Patient had minimum heart rate of 46 bpm, maximum heart rate of 23 bpm with an average heart rate of 73 bpm.  Predominant rhythm was sinus.  There were 12 patient triggered events which primarily correlated with sinus rhythm, and occasional ventricular ectopy.  Patient did have symptomatic supraventricular tachycardia with the fastest interval lasting 7 beats at a maximum rate of 203 bpm, longest lasting 8 beats with an average heart rate of 93 bpm.  Rare PVCs and PACs.  Patient also with several brief episodes of atrial tachycardia.  CT Cardiac Scoring: 06/28/2021 Left Main: 0 LAD: 34.3 LCx: 0  RCA: 0 Total CAC 34.3 AU, 63rd percentile for patient's age, sex, and race.  LABORATORY DATA:    Latest Ref Rng & Units 12/23/2022    9:05 AM 11/18/2022    9:42 AM 09/19/2022    9:21 AM  CBC  WBC 4.0 - 10.5 K/uL 2.4  2.2  2.5   Hemoglobin 12.0 - 15.0 g/dL 13.2  13.8  13.7   Hematocrit 36.0 - 46.0 % 36.3  37.6  38.3   Platelets 150 - 400 K/uL 172  153  154        Latest Ref Rng & Units 12/23/2022    9:05 AM 11/18/2022    9:42 AM 09/19/2022    9:21 AM  CMP  Glucose 70 - 99 mg/dL 96  86  97   BUN 8 - 23 mg/dL '14  18  15   '$ Creatinine 0.44 - 1.00 mg/dL 0.79  0.77  0.89   Sodium 135 - 145 mmol/L 139  139  137   Potassium 3.5 - 5.1 mmol/L 4.2  4.2  4.0   Chloride 98 - 111 mmol/L 106  107  105   CO2 22 - 32 mmol/L '24  26  26   '$ Calcium 8.9 -  10.3 mg/dL 8.8  8.9  8.7   Total Protein 6.5 - 8.1 g/dL 7.1  7.3  7.4   Total Bilirubin 0.3 - 1.2 mg/dL 0.4  0.6  0.6   Alkaline Phos 38 - 126 U/L 86  89  92   AST 15 - 41 U/L 34  35  31   ALT 0 - 44 U/L '27  25  23     '$ Lipid Panel     Component Value Date/Time   CHOL 217 (H) 12/23/2022 0905   CHOL 216 (H) 12/23/2022 0905   TRIG 209 (H) 12/23/2022 0905   HDL 49 12/23/2022 0905   CHOLHDL 4.4 12/23/2022 0905   VLDL 42 (H) 12/23/2022 0905   LDLCALC 125 (H) 12/23/2022 0905    No components found for: "NTPROBNP" No results for input(s): "PROBNP" in the last 8760 hours. No results for input(s): "TSH" in the last 8760 hours.  BMP Recent Labs    09/19/22 0921 11/18/22 0942 12/23/22 0905  NA 137 139 139  K 4.0 4.2 4.2  CL 105 107 106  CO2 '26 26 24  '$ GLUCOSE 97 86 96  BUN '15 18 14  '$ CREATININE 0.89 0.77 0.79  CALCIUM 8.7* 8.9 8.8*  GFRNONAA >60 >60 >60    HEMOGLOBIN A1C No results found for: "HGBA1C", "MPG"  External Labs: Collected: 10/18/2022 performed at [facility] available in [place]. White count 3.5, hemoglobin 14.3, hematocrit 41.9%, Platelets 188 Sodium 138, potassium 4.4, chloride 103, bicarb 26, BUN 14, creatinine 0.9. AST 33, ALT 24, alkaline phosphatase 100 TSH 0.79 Total cholesterol 189, triglycerides 121, HDL 51, LDL calculated 114,  Collected: 12/23/2022: Total cholesterol 217, triglycerides 209, HDL 49, LDL calculated 125  IMPRESSION:    ICD-10-CM   1. Coronary atherosclerosis due to calcified coronary lesion  I25.10    I25.84     2. Pure hypercholesterolemia  E78.00     3. Hypertriglyceridemia  E78.1     4. T-cell lymphoma ([place])  C85.90     5. Palpitations  R00.2     6. Hypertension, essential  I10         RECOMMENDATIONS: Vergil Gosch Reggio is a 72 y.o. Caucasian female whose past medical history and cardiac risk factors include: Coronary artery calcification (34.3 AU, 63rd percentile), hypertension, hyperlipidemia,  palpitations, mycosis fungoides, T-cell lymphoma, and colorectal cancer, family history of premature coronary artery disease.   Coronary atherosclerosis due to calcified coronary lesion Pure hypercholesterolemia Hypertriglyceridemia Total CAC 34.3, 63rd percentile. Currently on Zetia. Remains hesitant to be on statin therapy due to side effect profile. After starting Bexarotene lipids and triglycerides have up trended.  She would like to continue therapy for at least 1 more month and will have repeat lipids and if they remain elevated is willing to start pharmacological therapy. Since her lipids are secondary to her medication profile given her treatment she will follow-up with either PCP or dermatology/oncology for further guidance.  T-cell lymphoma ([place]) Currently being managed by her dermatology/oncology.  Palpitations Stable  Hypertension, essential Office blood pressures are well-controlled. Continue current medical therapy. No changes warranted at this time  As part of today's office visit discussed management of these 2 chronic comorbid conditions, independently reviewed echo and stress test results, outside labs independently reviewed and documented above.  FINAL MEDICATION LIST END OF ENCOUNTER: No orders of the defined types were placed in this encounter.   There are no discontinued medications.    Current Outpatient Medications:    amLODipine (NORVASC) 5 MG tablet, Take 1 tablet (5 mg total) by mouth daily., Disp: 90 tablet, Rfl: 3   B Complex Vitamins (VITAMIN B COMPLEX) TABS, Take by mouth. 5000 UT, Disp: , Rfl:    bexarotene (TARGRETIN) 75 MG CAPS capsule, Take 75 mg by mouth daily., Disp: , Rfl:    Biotin 5000  MCG CAPS, , Disp: , Rfl:    cetirizine (ZYRTEC) 10 MG tablet, Take 10 mg by mouth daily., Disp: , Rfl:    Cholecalciferol (VITAMIN D) 125 MCG (5000 UT) CAPS, , Disp: , Rfl:    Cod Liver Oil 1000 MG CAPS, Take by mouth., Disp: , Rfl:    Cranberry 1000 MG  CAPS, Take by mouth., Disp: , Rfl:    ezetimibe (ZETIA) 10 MG tablet, Take 1 tablet (10 mg total) by mouth daily., Disp: 90 tablet, Rfl: 3   famotidine (PEPCID) 20 MG tablet, Take 20 mg by mouth as needed., Disp: , Rfl:    metoprolol tartrate (LOPRESSOR) 25 MG tablet, Take 0.5 tablets (12.5 mg total) by mouth daily., Disp: , Rfl:    PEGASYS 180 MCG/ML injection, Inject 180 mcg into the skin every 7 (seven) days., Disp: , Rfl:    Probiotic Product (PROBIOTIC DAILY PO), Take by mouth., Disp: , Rfl:    tacrolimus (PROTOPIC) 0.1 % ointment, , Disp: , Rfl:    triamcinolone cream (KENALOG) 0.1 %, , Disp: , Rfl:    vitamin k 100 MCG tablet, Take 100 mcg by mouth daily., Disp: , Rfl:   No orders of the defined types were placed in this encounter.   There are no Patient Instructions on file for this visit.   --Continue cardiac medications as reconciled in final medication list. --Return in about 6 weeks (around 02/13/2023) for Follow up lipids. or sooner if needed. --Continue follow-up with your primary care physician regarding the management of your other chronic comorbid conditions.  Patient's questions and concerns were addressed to her satisfaction. She voices understanding of the instructions provided during this encounter.   This note was created using a voice recognition software as a result there may be grammatical errors inadvertently enclosed that do not reflect the nature of this encounter. Every attempt is made to correct such errors.  Rex Kras, [place], [facility]  Pager: [phone] Office: [phone]

## 2023-01-04 ENCOUNTER — Other Ambulatory Visit: Payer: Self-pay

## 2023-01-06 ENCOUNTER — Encounter: Payer: Self-pay | Admitting: Hematology

## 2023-02-07 ENCOUNTER — Other Ambulatory Visit: Payer: Self-pay

## 2023-02-13 ENCOUNTER — Ambulatory Visit: Payer: 59 | Admitting: Cardiology

## 2023-02-23 ENCOUNTER — Encounter: Payer: Self-pay | Admitting: Hematology

## 2023-02-24 ENCOUNTER — Other Ambulatory Visit: Payer: Self-pay

## 2023-02-24 DIAGNOSIS — C8408 Mycosis fungoides, lymph nodes of multiple sites: Secondary | ICD-10-CM

## 2023-02-24 DIAGNOSIS — E7849 Other hyperlipidemia: Secondary | ICD-10-CM

## 2023-02-24 DIAGNOSIS — C8590 Non-Hodgkin lymphoma, unspecified, unspecified site: Secondary | ICD-10-CM

## 2023-02-25 ENCOUNTER — Other Ambulatory Visit: Payer: Self-pay

## 2023-02-28 ENCOUNTER — Other Ambulatory Visit: Payer: Self-pay

## 2023-03-02 ENCOUNTER — Ambulatory Visit: Payer: 59 | Admitting: Cardiology

## 2023-03-03 ENCOUNTER — Inpatient Hospital Stay: Payer: Medicare Other | Attending: Hematology

## 2023-03-03 DIAGNOSIS — Z7952 Long term (current) use of systemic steroids: Secondary | ICD-10-CM | POA: Insufficient documentation

## 2023-03-03 DIAGNOSIS — C8408 Mycosis fungoides, lymph nodes of multiple sites: Secondary | ICD-10-CM | POA: Insufficient documentation

## 2023-03-03 DIAGNOSIS — E7849 Other hyperlipidemia: Secondary | ICD-10-CM

## 2023-03-03 DIAGNOSIS — Z79899 Other long term (current) drug therapy: Secondary | ICD-10-CM | POA: Insufficient documentation

## 2023-03-03 DIAGNOSIS — C8590 Non-Hodgkin lymphoma, unspecified, unspecified site: Secondary | ICD-10-CM

## 2023-03-03 LAB — CMP (CANCER CENTER ONLY)
ALT: 19 U/L (ref 0–44)
AST: 31 U/L (ref 15–41)
Albumin: 4.3 g/dL (ref 3.5–5.0)
Alkaline Phosphatase: 69 U/L (ref 38–126)
Anion gap: 7 (ref 5–15)
BUN: 15 mg/dL (ref 8–23)
CO2: 27 mmol/L (ref 22–32)
Calcium: 9.1 mg/dL (ref 8.9–10.3)
Chloride: 103 mmol/L (ref 98–111)
Creatinine: 0.82 mg/dL (ref 0.44–1.00)
GFR, Estimated: >60 mL/min (ref >60)
Glucose, Bld: 91 mg/dL (ref 70–99)
Potassium: 4.3 mmol/L (ref 3.5–5.1)
Sodium: 137 mmol/L (ref 135–145)
Total Bilirubin: 0.4 mg/dL (ref 0.3–1.2)
Total Protein: 7.5 g/dL (ref 6.5–8.1)

## 2023-03-03 LAB — LIPID PANEL
Cholesterol: 229 mg/dL — ABNORMAL HIGH (ref 0–200)
HDL: 47 mg/dL (ref >40)
LDL Cholesterol: 148 mg/dL — ABNORMAL HIGH (ref 0–99)
Total CHOL/HDL Ratio: 4.9 RATIO
Triglycerides: 170 mg/dL — ABNORMAL HIGH (ref <150)
VLDL: 34 mg/dL (ref 0–40)

## 2023-03-03 LAB — CBC WITH DIFFERENTIAL (CANCER CENTER ONLY)
Abs Immature Granulocytes: 0 10*3/uL (ref 0.00–0.07)
Basophils Absolute: 0 10*3/uL (ref 0.0–0.1)
Basophils Relative: 0 %
Eosinophils Absolute: 0 10*3/uL (ref 0.0–0.5)
Eosinophils Relative: 3 %
HCT: 36.2 % (ref 36.0–46.0)
Hemoglobin: 12.4 g/dL (ref 12.0–15.0)
Immature Granulocytes: 0 %
Lymphocytes Relative: 42 %
Lymphs Abs: 0.6 10*3/uL — ABNORMAL LOW (ref 0.7–4.0)
MCH: 32.5 pg (ref 26.0–34.0)
MCHC: 34.3 g/dL (ref 30.0–36.0)
MCV: 95 fL (ref 80.0–100.0)
Monocytes Absolute: 0.3 10*3/uL (ref 0.1–1.0)
Monocytes Relative: 18 %
Neutro Abs: 0.5 10*3/uL — ABNORMAL LOW (ref 1.7–7.7)
Neutrophils Relative %: 37 %
Platelet Count: 131 10*3/uL — ABNORMAL LOW (ref 150–400)
RBC: 3.81 MIL/uL — ABNORMAL LOW (ref 3.87–5.11)
RDW: 12.5 % (ref 11.5–15.5)
WBC Count: 1.4 10*3/uL — ABNORMAL LOW (ref 4.0–10.5)
nRBC: 0 % (ref 0.0–0.2)

## 2023-03-05 LAB — T4: T4, Total: 9.8 ug/dL (ref 4.5–12.0)

## 2023-03-15 ENCOUNTER — Encounter: Payer: Self-pay | Admitting: Hematology

## 2023-03-16 ENCOUNTER — Other Ambulatory Visit: Payer: Self-pay

## 2023-03-17 ENCOUNTER — Other Ambulatory Visit: Payer: Self-pay

## 2023-03-22 ENCOUNTER — Other Ambulatory Visit: Payer: Self-pay

## 2023-03-22 DIAGNOSIS — C8590 Non-Hodgkin lymphoma, unspecified, unspecified site: Secondary | ICD-10-CM

## 2023-03-23 ENCOUNTER — Ambulatory Visit: Payer: 59 | Admitting: Cardiology

## 2023-03-23 ENCOUNTER — Inpatient Hospital Stay: Payer: Medicare Other | Attending: Hematology

## 2023-03-23 DIAGNOSIS — C8408 Mycosis fungoides, lymph nodes of multiple sites: Secondary | ICD-10-CM | POA: Insufficient documentation

## 2023-03-23 DIAGNOSIS — C8590 Non-Hodgkin lymphoma, unspecified, unspecified site: Secondary | ICD-10-CM

## 2023-03-23 LAB — CBC WITH DIFFERENTIAL (CANCER CENTER ONLY)
Abs Immature Granulocytes: 0.02 10*3/uL (ref 0.00–0.07)
Basophils Absolute: 0 10*3/uL (ref 0.0–0.1)
Basophils Relative: 1 %
Eosinophils Absolute: 0.1 10*3/uL (ref 0.0–0.5)
Eosinophils Relative: 2 %
HCT: 35.9 % — ABNORMAL LOW (ref 36.0–46.0)
Hemoglobin: 12.7 g/dL (ref 12.0–15.0)
Immature Granulocytes: 1 %
Lymphocytes Relative: 30 %
Lymphs Abs: 0.9 10*3/uL (ref 0.7–4.0)
MCH: 33.2 pg (ref 26.0–34.0)
MCHC: 35.4 g/dL (ref 30.0–36.0)
MCV: 94 fL (ref 80.0–100.0)
Monocytes Absolute: 0.4 10*3/uL (ref 0.1–1.0)
Monocytes Relative: 13 %
Neutro Abs: 1.5 10*3/uL — ABNORMAL LOW (ref 1.7–7.7)
Neutrophils Relative %: 53 %
Platelet Count: 145 10*3/uL — ABNORMAL LOW (ref 150–400)
RBC: 3.82 MIL/uL — ABNORMAL LOW (ref 3.87–5.11)
RDW: 13 % (ref 11.5–15.5)
WBC Count: 2.9 10*3/uL — ABNORMAL LOW (ref 4.0–10.5)
nRBC: 0 % (ref 0.0–0.2)

## 2023-04-11 ENCOUNTER — Encounter: Payer: Self-pay | Admitting: Hematology

## 2023-04-11 ENCOUNTER — Other Ambulatory Visit: Payer: Self-pay

## 2023-04-11 DIAGNOSIS — C8590 Non-Hodgkin lymphoma, unspecified, unspecified site: Secondary | ICD-10-CM

## 2023-04-11 DIAGNOSIS — E7849 Other hyperlipidemia: Secondary | ICD-10-CM

## 2023-04-11 DIAGNOSIS — C8408 Mycosis fungoides, lymph nodes of multiple sites: Secondary | ICD-10-CM

## 2023-04-12 ENCOUNTER — Other Ambulatory Visit: Payer: Self-pay

## 2023-04-13 ENCOUNTER — Other Ambulatory Visit: Payer: Self-pay

## 2023-04-15 ENCOUNTER — Other Ambulatory Visit: Payer: Self-pay

## 2023-04-17 ENCOUNTER — Ambulatory Visit: Payer: 59 | Admitting: Cardiology

## 2023-04-17 ENCOUNTER — Encounter (HOSPITAL_BASED_OUTPATIENT_CLINIC_OR_DEPARTMENT_OTHER): Payer: Self-pay | Admitting: Obstetrics & Gynecology

## 2023-04-18 ENCOUNTER — Ambulatory Visit (INDEPENDENT_AMBULATORY_CARE_PROVIDER_SITE_OTHER): Payer: Medicare Other | Admitting: Sports Medicine

## 2023-04-18 VITALS — BP 120/70 | Ht 63.0 in | Wt 130.0 lb

## 2023-04-18 DIAGNOSIS — M25511 Pain in right shoulder: Secondary | ICD-10-CM

## 2023-04-18 NOTE — Progress Notes (Unsigned)
PCP: Eartha Inch, MD  Subjective:   HPI: Patient is a 72 y.o. female with PMH HTN, HLD, T-cell lymphoma on chemotherapy here for right shoulder evaluation.  The patient has had slight limitations in range of motion of her right shoulder for the past 3 months.  She describes that with her left shoulder, she can easily reach behind her back and touch her hands together or reach across her other shoulder, but that she has difficulties doing the same with right shoulder.  She occasionally feels pulling and soreness with shoulder exercises behind her right shoulder and moving up her right trapezius, however she would not describe this as significant pain.  She is still able to perform all of her ADLs and most exercises during Pilates, but she mentions that she had because her private Pilates instructor told her she might have some rotator cuff pathology on the right that is causing limited range of motion.  They had started working on some rotator cuff exercises, but she has not been very compliant with these.  Shoulder is not consistently painful and she has not been taking any OTC meds.   Objective:  BP 120/70   Ht 5\' 3"  (1.6 m)   Wt 130 lb (59 kg)   LMP 11/07/2002   BMI 23.03 kg/m   Physical Exam:  Gen: NAD, comfortable in exam room  Neck: No TTP over right C-spine or trapezius.  Negative Spurling's test.  Right shoulder: No deformities, ecchymosis, edema, erythema or warmth.  Mild limitation of range of motion on internal rotation and external rotation compared to the contralateral side, otherwise full active and passive range of motion of abduction.  No reproducible pain with internal or external rotation against resistance.  Negative empty can.  Mild tenderness/pulling along posterior shoulder with liftoff test.  Negative Neer.  Mild tenderness/pulling along posterior shoulder with Hawkins.  Negative crossarm.   Assessment & Plan:  1.  Limited right shoulder range of  motion: Patient presents with mild limitations of range of motion on internal>external rotation of right shoulder.  Occasional soreness but no significant reproducible pain on rotator cuff testing, maybe a slight "pulling" sensation.  Presentation most likely consistent with a mild rotator cuff tendinopathy versus OA versus very early frozen shoulder, but clinically not consistent with adhesive capsulitis with fairly good range of motion at this time.  Recommended rotator cuff exercises and conservative management at this time and will follow-up in 3 to 4 weeks.  Can consider shoulder x-ray at that time if symptoms have not improved.  Patient seen and evaluated with the resident.  I agree with the above plan of care.  Patient's exam today is fairly unremarkable.  Her history suggest probable rotator cuff tendinopathy so we will treat with home exercises.  Follow-up with me again in 3 to 4 weeks for reevaluation.  Consider imaging in the form of x-ray and ultrasound if symptoms or not improving.  This note was dictated using Dragon naturally speaking software and may contain errors in syntax, spelling, or content which have not been identified prior to signing this note.

## 2023-04-26 ENCOUNTER — Inpatient Hospital Stay: Payer: Medicare Other | Attending: Hematology

## 2023-04-26 ENCOUNTER — Other Ambulatory Visit: Payer: Self-pay

## 2023-04-26 DIAGNOSIS — C8590 Non-Hodgkin lymphoma, unspecified, unspecified site: Secondary | ICD-10-CM | POA: Insufficient documentation

## 2023-04-26 DIAGNOSIS — E7849 Other hyperlipidemia: Secondary | ICD-10-CM | POA: Insufficient documentation

## 2023-04-26 DIAGNOSIS — Z79899 Other long term (current) drug therapy: Secondary | ICD-10-CM | POA: Diagnosis not present

## 2023-04-26 DIAGNOSIS — C8408 Mycosis fungoides, lymph nodes of multiple sites: Secondary | ICD-10-CM | POA: Diagnosis not present

## 2023-04-26 LAB — CMP (CANCER CENTER ONLY)
ALT: 14 U/L (ref 0–44)
AST: 25 U/L (ref 15–41)
Albumin: 4 g/dL (ref 3.5–5.0)
Alkaline Phosphatase: 59 U/L (ref 38–126)
Anion gap: 7 (ref 5–15)
BUN: 13 mg/dL (ref 8–23)
CO2: 27 mmol/L (ref 22–32)
Calcium: 9.4 mg/dL (ref 8.9–10.3)
Chloride: 104 mmol/L (ref 98–111)
Creatinine: 0.84 mg/dL (ref 0.44–1.00)
GFR, Estimated: >60 mL/min (ref >60)
Glucose, Bld: 93 mg/dL (ref 70–99)
Potassium: 4.2 mmol/L (ref 3.5–5.1)
Sodium: 138 mmol/L (ref 135–145)
Total Bilirubin: 0.4 mg/dL (ref 0.3–1.2)
Total Protein: 7.5 g/dL (ref 6.5–8.1)

## 2023-04-26 LAB — LIPID PANEL
Cholesterol: 225 mg/dL — ABNORMAL HIGH (ref 0–200)
HDL: 49 mg/dL (ref >40)
LDL Cholesterol: 151 mg/dL — ABNORMAL HIGH (ref 0–99)
Total CHOL/HDL Ratio: 4.6 RATIO
Triglycerides: 123 mg/dL (ref <150)
VLDL: 25 mg/dL (ref 0–40)

## 2023-04-26 LAB — CBC WITH DIFFERENTIAL (CANCER CENTER ONLY)
Abs Immature Granulocytes: 0 10*3/uL (ref 0.00–0.07)
Basophils Absolute: 0 10*3/uL (ref 0.0–0.1)
Basophils Relative: 1 %
Eosinophils Absolute: 0.1 10*3/uL (ref 0.0–0.5)
Eosinophils Relative: 3 %
HCT: 36.5 % (ref 36.0–46.0)
Hemoglobin: 12.6 g/dL (ref 12.0–15.0)
Immature Granulocytes: 0 %
Lymphocytes Relative: 32 %
Lymphs Abs: 0.6 10*3/uL — ABNORMAL LOW (ref 0.7–4.0)
MCH: 33 pg (ref 26.0–34.0)
MCHC: 34.5 g/dL (ref 30.0–36.0)
MCV: 95.5 fL (ref 80.0–100.0)
Monocytes Absolute: 0.2 10*3/uL (ref 0.1–1.0)
Monocytes Relative: 12 %
Neutro Abs: 1.1 10*3/uL — ABNORMAL LOW (ref 1.7–7.7)
Neutrophils Relative %: 52 %
Platelet Count: 153 10*3/uL (ref 150–400)
RBC: 3.82 MIL/uL — ABNORMAL LOW (ref 3.87–5.11)
RDW: 12.8 % (ref 11.5–15.5)
WBC Count: 2 10*3/uL — ABNORMAL LOW (ref 4.0–10.5)
nRBC: 0 % (ref 0.0–0.2)

## 2023-04-28 LAB — T4: T4, Total: 7.4 ug/dL (ref 4.5–12.0)

## 2023-05-06 ENCOUNTER — Encounter: Payer: Self-pay | Admitting: Hematology

## 2023-05-09 ENCOUNTER — Ambulatory Visit: Payer: Medicare Other | Admitting: Cardiology

## 2023-05-09 ENCOUNTER — Encounter: Payer: Self-pay | Admitting: Cardiology

## 2023-05-09 VITALS — BP 147/77 | HR 76 | Resp 16 | Ht 63.0 in | Wt 129.2 lb

## 2023-05-09 DIAGNOSIS — I10 Essential (primary) hypertension: Secondary | ICD-10-CM

## 2023-05-09 DIAGNOSIS — E7800 Pure hypercholesterolemia, unspecified: Secondary | ICD-10-CM

## 2023-05-09 DIAGNOSIS — E781 Pure hyperglyceridemia: Secondary | ICD-10-CM

## 2023-05-09 DIAGNOSIS — I2510 Atherosclerotic heart disease of native coronary artery without angina pectoris: Secondary | ICD-10-CM

## 2023-05-09 DIAGNOSIS — C8590 Non-Hodgkin lymphoma, unspecified, unspecified site: Secondary | ICD-10-CM

## 2023-05-09 MED ORDER — NEXLIZET 180-10 MG PO TABS: 1.0000 | ORAL_TABLET | Freq: Every day | ORAL | 0 refills | Status: DC

## 2023-05-09 NOTE — Progress Notes (Signed)
ID:  Alexa [name], [place] September 26, 1951, MRN [id number]  PCP:  Eartha Inch, MD  Cardiologist:  Tessa Lerner, DO, [facility]  Former Cardiology Providers: Elvin So, PA  Date: 05/09/23 Last Office Visit: 01/02/2023  Chief Complaint  Patient presents with   Follow-up    Lipid management    HPI  Alexa [name] is a 72 y.o. Caucasian female whose past medical history and cardiovascular risk factors include: Coronary artery calcification (34.3 AU, 63rd percentile), hypertension, hyperlipidemia, palpitations, mycosis fungoides, T-cell lymphoma, and colorectal cancer, family history of premature coronary artery disease.   Patient is currently going treatment for her T-cell lymphoma at [facility] and the medication management for her T-cell lymphoma predisposes her to lipid disorders.  She also has multiple cardiovascular comorbidities including coronary artery calcification, hypertension and hyperlipidemia.  She has been reluctant to be on statin therapy due to medication profile.  She is currently on Zetia 10 mg p.o. daily.  Review of her lipids since February 2024 note elevated LDL levels.  Patient understands that high LDL levels predisposes her to atherosclerosis and various vascular territories/beds.  Since last office visit her triglycerides have improved with increasing high soluble fiber diet, addition of omega fatty acids, and consuming fish at least once a week.  She also is seeing a dietitian who recommends watching her LDL for at least 6 months to appreciate the full effects of dietary changes and lifestyle modifications.  She denies anginal chest pain or heart failure symptoms.  ALLERGIES: Allergies  Allergen Reactions   Ciprofloxacin Other (See Comments)    Gluteus medius tear  Gluteus medius tear    Muscle tear   Diphenhydramine Hcl     Other reaction(s): Other (See Comments) Pt becomes extremely hyper; pt requested premed for Mogamulizumab per NP    Pseudoephedrine Hcl Palpitations   Sudafed [Pseudoephedrine Hcl] Palpitations    MEDICATION LIST PRIOR TO VISIT: Current Meds  Medication Sig   amLODipine (NORVASC) 5 MG tablet Take 1 tablet (5 mg total) by mouth daily.   B Complex Vitamins (VITAMIN B COMPLEX) TABS Take by mouth. 5000 UT   Bempedoic Acid-Ezetimibe (NEXLIZET) 180-10 MG TABS Take 1 tablet by mouth daily.   bexarotene (TARGRETIN) 75 MG CAPS capsule Take 75 mg by mouth daily.   cetirizine (ZYRTEC) 10 MG tablet Take 10 mg by mouth daily.   Cholecalciferol (VITAMIN D) 125 MCG (5000 UT) CAPS    Cranberry 1000 MG CAPS Take by mouth.   famotidine (PEPCID) 20 MG tablet Take 20 mg by mouth as needed.   metoprolol tartrate (LOPRESSOR) 25 MG tablet Take 0.5 tablets (12.5 mg total) by mouth daily.   PEGASYS 180 MCG/ML injection Inject 180 mcg into the skin every 7 (seven) days.   Probiotic Product (PROBIOTIC DAILY PO) Take by mouth.   tacrolimus (PROTOPIC) 0.1 % ointment    triamcinolone cream (KENALOG) 0.1 %    vitamin k 100 MCG tablet Take 100 mcg by mouth daily.   [DISCONTINUED] ezetimibe (ZETIA) 10 MG tablet Take 1 tablet (10 mg total) by mouth daily.     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 @ [facility]   Family history of anesthesia complication    sister and sister's children postitive for MH!!!!!!, pt tested negative 3-36yrs ago   GERD (gastroesophageal reflux disease)    History of hypertension    off medication  after weight loss   Hyperlipemia    Lymphoma (HCC)    light box therapy   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HCC)    Osteopenia    bilateral hips   Rectal cancer (HCC) 04/2017    PAST SURGICAL HISTORY: Past Surgical History:  Procedure Laterality Date   BREAST BIOPSY     COLONOSCOPY  03/07/2012   with biopsy   DILATATION & CURRETTAGE/HYSTEROSCOPY WITH RESECTOCOPE N/A 06/02/2014   Procedure: DILATATION &  CURETTAGE/HYSTEROSCOPY WITH RESECTOCOPE;  Surgeon: Annamaria Boots, MD;  Location: [facility];  Service: Gynecology;  Laterality: N/A;   FOOT SURGERY     Right    TRANSANAL EXCISION OF RECTAL MASS N/A 06/09/2017   Procedure: TRANSANAL EXCISION OF RECTAL MASS;  Surgeon: Romie Levee, MD;  Location: [facility];  Service: General;  Laterality: N/A;    FAMILY HISTORY: The patient family history includes Anuerysm in her father; Cancer in her brother and father; Diabetes in her father; Heart attack in her brother, mother, and nephew; Heart disease in her father and mother; Hypertension in her father, mother, and sister; Kidney cancer in her mother; Kidney disease in her mother; Melanoma in her brother; Other in her brother; Ovarian cancer in her paternal grandmother.  SOCIAL HISTORY:  The patient  reports that she has never smoked. She has never used smokeless tobacco. She reports that she does not currently use alcohol. She reports that she does not use drugs.  REVIEW OF SYSTEMS: Review of Systems  Cardiovascular:  Negative for chest pain, claudication, dyspnea on exertion, irregular heartbeat, leg swelling, near-syncope, orthopnea, palpitations, paroxysmal nocturnal dyspnea and syncope.  Respiratory:  Negative for shortness of breath.   Hematologic/Lymphatic: Negative for bleeding problem.  Musculoskeletal:  Negative for muscle cramps and myalgias.  Neurological:  Negative for dizziness and light-headedness.   PHYSICAL EXAM:    05/09/2023    1:52 PM 04/18/2023    3:57 PM 01/02/2023    2:01 PM  Vitals with BMI  Height 5\' 3"  5\' 3"  5' 3.5"  Weight 129 lbs 3 oz 130 lbs 135 lbs  BMI 22.89 23.03 23.54  Systolic 147 120 161  Diastolic 77 70 72  Pulse 76  69    Physical Exam  Constitutional: No distress.  Age appropriate, hemodynamically stable.   Neck: No JVD present.  Cardiovascular: Normal rate, regular rhythm, S1 normal, S2 normal, intact distal pulses and normal pulses.  Exam reveals no gallop, no S3 and no S4.  No murmur heard. Pulmonary/Chest: Effort normal and breath sounds normal. No stridor. She has no wheezes. She has no rales.  Abdominal: Soft. Bowel sounds are normal. She exhibits no distension. There is no abdominal tenderness.  Musculoskeletal:        General: No edema.     Cervical back: Neck supple.  Neurological: She is alert and oriented to person, place, and time. She has intact cranial nerves (2-12).  Skin: Skin is warm and moist. Rash noted.   CARDIAC DATABASE: EKG: 11/11/2022: Normal sinus rhythm, 61 bpm, without underlying injury pattern, rare PVC.  Echocardiogram: 11/25/2022 1. Left ventricular ejection fraction, by estimation, is 60 to 65%. The left ventricle has normal function. The left ventricle has no regional wall motion abnormalities. Left ventricular diastolic parameters were normal. The average left ventricular  global longitudinal strain is -22.0 %. The global longitudinal strain is normal. 2. Right ventricular systolic function is normal. The right ventricular size is normal. 3. The mitral valve is normal in structure. No evidence  of mitral valve regurgitation. No evidence of mitral stenosis. 4. The aortic valve is normal in structure. Aortic valve regurgitation is not visualized. No aortic stenosis is present. 5. Rhythm strip during this exam demonstrates normal sinus rhythm.  Comparison(s): Outside echo 02/09/2017 reported LVEF 50%, trace MR and TR.   Stress Testing: Exercise nuclear stress test 11/28/2022 Myocardial perfusion is normal. Overall LV systolic function is normal without regional wall motion abnormalities. Stress LV EF: 73%. Low risk study. Normal ECG stress. The patient exercised for 9 minutes and 59 seconds of a Bruce protocol, achieving approximately 6.6 METs and 103% MPHR. The heart rate response was normal. The blood pressure response was normal. No previous exam available for comparison.   Heart  Catheterization: None  Bilateral carotid artery duplex 01/30/2017: Minimal amount of bilateral intimal thickening and atherosclerotic plaque, right greater than left, not resulting in a hemodynamically significant stenosis within either internal carotid artery.  LONG TERM MONITOR 10/07/2020-10/21/2020:  Patient had minimum heart rate of 46 bpm, maximum heart rate of 23 bpm with an average heart rate of 73 bpm.  Predominant rhythm was sinus.  There were 12 patient triggered events which primarily correlated with sinus rhythm, and occasional ventricular ectopy.  Patient did have symptomatic supraventricular tachycardia with the fastest interval lasting 7 beats at a maximum rate of 203 bpm, longest lasting 8 beats with an average heart rate of 93 bpm.  Rare PVCs and PACs.  Patient also with several brief episodes of atrial tachycardia.  CT Cardiac Scoring: 06/28/2021 Left Main: 0 LAD: 34.3 LCx: 0  RCA: 0 Total CAC 34.3 AU, 63rd percentile for patient's age, sex, and race.  LABORATORY DATA:    Latest Ref Rng & Units 04/26/2023    9:11 AM 03/23/2023    8:41 AM 03/03/2023    8:59 AM  CBC  WBC 4.0 - 10.5 K/uL 2.0  2.9  1.4   Hemoglobin 12.0 - 15.0 g/dL 16.1  09.6  04.5   Hematocrit 36.0 - 46.0 % 36.5  35.9  36.2   Platelets 150 - 400 K/uL 153  145  131        Latest Ref Rng & Units 04/26/2023    9:11 AM 03/03/2023    8:59 AM 12/23/2022    9:05 AM  CMP  Glucose 70 - 99 mg/dL 93  91  96   BUN 8 - 23 mg/dL 13  15  14    Creatinine 0.44 - 1.00 mg/dL 4.09  8.11  9.14   Sodium 135 - 145 mmol/L 138  137  139   Potassium 3.5 - 5.1 mmol/L 4.2  4.3  4.2   Chloride 98 - 111 mmol/L 104  103  106   CO2 22 - 32 mmol/L 27  27  24    Calcium 8.9 - 10.3 mg/dL 9.4  9.1  8.8   Total Protein 6.5 - 8.1 g/dL 7.5  7.5  7.1   Total Bilirubin 0.3 - 1.2 mg/dL 0.4  0.4  0.4   Alkaline Phos 38 - 126 U/L 59  69  86   AST 15 - 41 U/L 25  31  34   ALT 0 - 44 U/L 14  19  27      Lipid Panel     Component Value  Date/Time   CHOL 225 (H) 04/26/2023 0912   TRIG 123 04/26/2023 0912   HDL 49 04/26/2023 0912   CHOLHDL 4.6 04/26/2023 0912   VLDL 25 04/26/2023 0912   LDLCALC  151 (H) 04/26/2023 0912    No components found for: "NTPROBNP" No results for input(s): "PROBNP" in the last 8760 hours. No results for input(s): "TSH" in the last 8760 hours.  BMP Recent Labs    12/23/22 0905 03/03/23 0859 04/26/23 0911  NA 139 137 138  K 4.2 4.3 4.2  CL 106 103 104  CO2 24 27 27   GLUCOSE 96 91 93  BUN 14 15 13   CREATININE 0.79 0.82 0.84  CALCIUM 8.8* 9.1 9.4  GFRNONAA >60 >60 >60    HEMOGLOBIN A1C No results found for: "HGBA1C", "MPG"  External Labs: Collected: 10/18/2022 performed at [facility] available in Care Everywhere. White count 3.5, hemoglobin 14.3, hematocrit 41.9%, Platelets 188 Sodium 138, potassium 4.4, chloride 103, bicarb 26, BUN 14, creatinine 0.9. AST 33, ALT 24, alkaline phosphatase 100 TSH 0.79 Total cholesterol 189, triglycerides 121, HDL 51, LDL calculated 114,  Collected: 12/23/2022: Total cholesterol 217, triglycerides 209, HDL 49, LDL calculated 125  IMPRESSION:    ICD-10-CM   1. Pure hypercholesterolemia  E78.00 Bempedoic Acid-Ezetimibe (NEXLIZET) 180-10 MG TABS    Lipid Panel With LDL/HDL Ratio    LDL cholesterol, direct    CMP14+EGFR    2. Hypertriglyceridemia  E78.1 Bempedoic Acid-Ezetimibe (NEXLIZET) 180-10 MG TABS    Lipid Panel With LDL/HDL Ratio    LDL cholesterol, direct    CMP14+EGFR    3. Coronary atherosclerosis due to calcified coronary lesion  I25.10    I25.84     4. T-cell lymphoma (HCC)  C85.90     5. Hypertension, essential  I10          RECOMMENDATIONS: Alexa [name] is a 72 y.o. Caucasian female whose past medical history and cardiac risk factors include: Coronary artery calcification (34.3 AU, 63rd percentile), hypertension, hyperlipidemia, palpitations, mycosis fungoides, T-cell lymphoma, and colorectal cancer,  family history of premature coronary artery disease.   Coronary atherosclerosis due to calcified coronary lesion Pure hypercholesterolemia Hypertriglyceridemia Total CAC 34.3, 63rd percentile. Currently on Zetia. Most recent lipid profile from June 2024 still notes elevated LDL levels. Given her CAC and hyperlipidemia recommended statin therapy.  But she remains reluctant. She is willing to try another nonstatin medication. Discontinue Zetia. Start Nexlizet -medication profile discussed with the patient. Recommend rechecking fasting lipids in 6 weeks to reevaluate therapy. Triglyceride levels have improved with uptitration of high soluble fiber diet, omega fatty acids, and consuming fish regularly.  T-cell lymphoma (HCC) Currently being managed by her dermatology/oncology.  Hypertension, essential Office blood pressures not well-controlled. Patient is asked to be more cognizant of blood pressure readings at home to see if further medication titration is warranted. Continue current medical therapy.  Discussed management of at least 2 chronic comorbid conditions, reviewed labs in epic as well as care everywhere, discussed medication profile, and follow-up.  FINAL MEDICATION LIST END OF ENCOUNTER: Meds ordered this encounter  Medications   Bempedoic Acid-Ezetimibe (NEXLIZET) 180-10 MG TABS    Sig: Take 1 tablet by mouth daily.    Dispense:  90 tablet    Refill:  0    Medications Discontinued During This Encounter  Medication Reason   Cod Liver Oil 1000 MG CAPS    Biotin 5000 MCG CAPS    ezetimibe (ZETIA) 10 MG tablet Change in therapy      Current Outpatient Medications:    amLODipine (NORVASC) 5 MG tablet, Take 1 tablet (5 mg total) by mouth daily., Disp: 90 tablet, Rfl: 3   B Complex Vitamins (VITAMIN B  COMPLEX) TABS, Take by mouth. 5000 UT, Disp: , Rfl:    Bempedoic Acid-Ezetimibe (NEXLIZET) 180-10 MG TABS, Take 1 tablet by mouth daily., Disp: 90 tablet, Rfl: 0    bexarotene (TARGRETIN) 75 MG CAPS capsule, Take 75 mg by mouth daily., Disp: , Rfl:    cetirizine (ZYRTEC) 10 MG tablet, Take 10 mg by mouth daily., Disp: , Rfl:    Cholecalciferol (VITAMIN D) 125 MCG (5000 UT) CAPS, , Disp: , Rfl:    Cranberry 1000 MG CAPS, Take by mouth., Disp: , Rfl:    famotidine (PEPCID) 20 MG tablet, Take 20 mg by mouth as needed., Disp: , Rfl:    metoprolol tartrate (LOPRESSOR) 25 MG tablet, Take 0.5 tablets (12.5 mg total) by mouth daily., Disp: , Rfl:    PEGASYS 180 MCG/ML injection, Inject 180 mcg into the skin every 7 (seven) days., Disp: , Rfl:    Probiotic Product (PROBIOTIC DAILY PO), Take by mouth., Disp: , Rfl:    tacrolimus (PROTOPIC) 0.1 % ointment, , Disp: , Rfl:    triamcinolone cream (KENALOG) 0.1 %, , Disp: , Rfl:    vitamin k 100 MCG tablet, Take 100 mcg by mouth daily., Disp: , Rfl:   Orders Placed This Encounter  Procedures   Lipid Panel With LDL/HDL Ratio   LDL cholesterol, direct   ZOX09+UEAV    There are no Patient Instructions on file for this visit.   --Continue cardiac medications as reconciled in final medication list. --Return in about 3 months (around 08/09/2023) for Follow up HLD. or sooner if needed. --Continue follow-up with your primary care physician regarding the management of your other chronic comorbid conditions.  Patient's questions and concerns were addressed to her satisfaction. She voices understanding of the instructions provided during this encounter.   This note was created using a voice recognition software as a result there may be grammatical errors inadvertently enclosed that do not reflect the nature of this encounter. Every attempt is made to correct such errors.  Tessa Lerner, [place], [facility]  Pager:  [phone] Office: [phone]

## 2023-05-10 ENCOUNTER — Other Ambulatory Visit: Payer: Self-pay

## 2023-05-15 ENCOUNTER — Other Ambulatory Visit: Payer: Self-pay | Admitting: Obstetrics & Gynecology

## 2023-05-15 DIAGNOSIS — Z1231 Encounter for screening mammogram for malignant neoplasm of breast: Secondary | ICD-10-CM

## 2023-05-16 ENCOUNTER — Ambulatory Visit (INDEPENDENT_AMBULATORY_CARE_PROVIDER_SITE_OTHER): Payer: Medicare Other | Admitting: Sports Medicine

## 2023-05-16 VITALS — BP 138/78 | Ht 63.0 in | Wt 130.0 lb

## 2023-05-16 DIAGNOSIS — M25511 Pain in right shoulder: Secondary | ICD-10-CM | POA: Diagnosis not present

## 2023-05-16 DIAGNOSIS — M7581 Other shoulder lesions, right shoulder: Secondary | ICD-10-CM

## 2023-05-16 NOTE — Progress Notes (Signed)
PCP: Alexa Inch, MD  Subjective:   HPI: Patient is a 72 y.o. female here for Follow-up for right shoulder pain.  Patient was seen approximately 1 month ago.  At that time patient was noted to have some mild decreased range of motion of her right shoulder.  Patient's physical exam suggested probable rotator cuff tendinopathy and was treated with at home exercises. Patient is here for follow-up today and patient states that she has been doing her exercises every other day.  Patient states that she feels like there has been some mild improvement.  Patient still does note some tightness whenever she adducts her shoulder across her chest.  Patient also notes that she has been working with a Gaffer and that she is also been doing exercises during those sessions without too much difficulty.  Patient has been using a band for her exercises but would like to try using a dumbbell.  Patient otherwise has no other concerns at this time.       Objective:  Physical Exam:  Gen: NAD, comfortable in exam room  Shoulder, Right:  No skin changes, erythema, or ecchymosis noted. No evidence of bony deformity, asymmetry, or muscle atrophy; No tenderness over long head of biceps (bicipital groove). No TTP at [facility] joint. Patient has full active and passive range of motion with flexion and extension of the shoulders. No difficulty or decreased range of motion with abduction of the shoulders.  There is some mild decrease range of motion with adduction of the right arm across the chest. External rotation range of motion and strength is appropriate. There is some pain against resistance with internal rotation, strength 4 out of 5 otherwise strength 5/5 throughout. No abnormal scapular function observed. Sensation to light touch intact. Peripheral pulses intact. There is no difficulty with external rotation with the elbow stabilized.  No difficulty with abduction or any concerns for adhesive capsulitis  but  Special Tests:   - Painful Arc absent at 180   - Empty can: NEG   - Int/Ext Rotation test: Some pain on internal rotation   - Gerber Lift-Off Test: Positive   - Crossarm Adduction test: Positive   - Hawkins: NEG   - Neer test: NEG         Assessment & Plan:  1. 1. Right shoulder pain, unspecified chronicity  - Ambulatory referral to Physical Therapy  2. Rotator cuff tendonitis, right  - Ambulatory referral to Physical Therapy Given some mild improvement with shoulder exercise at previous visit -, Patient's symptoms are likely improving and are likely secondary to a mild rotator cuff tendinopathy versus OA though less likely. Also less likely patient has any frozen shoulder given physical exam which does not show any difficulty with external rotation or abduction.  After discussion with the patient, would recommend following up in 3 to 4 weeks after trialing physical therapy. -If in 3 to 4 weeks after physical therapy there is not significant improvement, can consider doing x-rays of the shoulder or ultrasound.  Patient seen and evaluated with the sports medicine fellow.  I agree with the above plan of care.  Although Sevasti may have noticed some slight improvement, I recommended that we try formal physical therapy.  [facility] will [facility] at [facility] and she will follow-up with me again in 3 to 4 weeks.  If she does not notice any improvement at that time, then we will start with imaging including x-ray and ultrasound of the shoulder.  This note was dictated using Dragon naturally speaking software and may contain errors in syntax, spelling, or content which have not been identified prior to signing this note.

## 2023-05-18 ENCOUNTER — Ambulatory Visit (INDEPENDENT_AMBULATORY_CARE_PROVIDER_SITE_OTHER): Payer: Medicare Other | Admitting: Family Medicine

## 2023-05-18 ENCOUNTER — Encounter: Payer: Self-pay | Admitting: Family Medicine

## 2023-05-18 VITALS — Ht 63.0 in | Wt 128.0 lb

## 2023-05-18 DIAGNOSIS — I10 Essential (primary) hypertension: Secondary | ICD-10-CM | POA: Diagnosis not present

## 2023-05-18 DIAGNOSIS — E7800 Pure hypercholesterolemia, unspecified: Secondary | ICD-10-CM

## 2023-05-18 DIAGNOSIS — M8580 Other specified disorders of bone density and structure, unspecified site: Secondary | ICD-10-CM | POA: Diagnosis not present

## 2023-05-18 NOTE — Patient Instructions (Addendum)
Continue getting 3 real meals per day.  Aim for no more than 5 hours between eating.   A REAL breakfast includes at least some protein and some starch (fruit optional).   A REAL lunch or dinner includes at least some protein, some starch, and vegetables.    Consider increasing variety of fruits for your breakfast, e.g., berries.  A smoothie can be a good breakfast option as well.  If you want to add kale to your smoothie, choose the baby kale.  Choose your base (yogurt/milk), +/- protein powder; fruit, up to 3 large kale leaves, other seasoning (vanilla extract, cinnamon, sweetener).  You may increase the protein content with firm of extra firm tofu.    Experiment with other bean recipes, e.g., soups, patties, "meat"balls, hummus, or just added beans to a salad or as part of rice, beans, and greens.    Complete the Meal Planning Form provided today.  Bring to follow-up for feedback.    Specific Recommendations:  1. Daily protein target: 60 to 70 grams/day. (RDA is only 48 g/day, but there is a lot of evidence to suggest 1.0-1.2 g/kg body weight is best for bone health.)  - Obtain 20 grams of protein per meal, which means each meal should have a food that you can identify as a protein food.   Obtain at least 20 grams of protein per meal.   1 egg or 1 ounce of meat, fish, poultry, or cheese provides about 7 grams of protein.  8 oz of milk or 2 tbsp peanut butter provides 8 grams of protein. 1 cup (8 oz) of most beans provides 15 grams of protein.    Yogurts vary greatly, but [place] yogurt is highest in protein.  NOTE: The size of a deck of cards is equal to about 3-4 ounces of meat, fish, or poultry.   2. Aim for getting half the volume of both lunch and dinner from vegetables (whether from raw or cooked).     Keep frozen veg's on hand for an easy, quick veg serving.      If using no-salt-added beans, do not rinse!    3. Do some type of strength training at least 3 X wk.  One way to approach  this is to write out a few different workouts from whatever sources you can find online or from friends.  A personal trainer can be invaluable in establishing a good routine.    Follow-up appt on Monday, Aug 5 at 11 AM.

## 2023-05-18 NOTE — Progress Notes (Signed)
Medical Nutrition Therapy PCP Antony Haste, MD Appt start time: 1100 end time: 1115 (15 minutes) Primary concerns today: Anoushka would like to better understand the best dietary approach for overall health and her conditions in particular.  She would like to minimize Rx medications.    Relevant history/background:  Nautia was referred for MNT related to HTN, osteopenia, and hyperlipidemia.  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  She had had arrhymias during long-term cardiac monitoring in 2021, although a  11/28/22 TM stress test showed HR response was normal, and she achieved 6.6. METs.  Labs from 04/26/23: TC 225; Tg 123; HDL 49; LDL calc 151, Chol:HDL 4.6.    Assessment:  Abagail has been following a high-fiber diet in May, hoping to avoid a statin to lower LDL.  Nayanna lives alone, and enjoys cooking.  She has been doing [employer] for bone density for almost a year.    Learning Readiness: Ready  Usual eating pattern: 3 meals and 1-3 snacks per day. Frequent foods and beverages: water, hot tea (in cold weather); instant flavored oatmeal, chia seeds, apple, walnuts, cinnamon, whole milk, bean salad (blk beans, red beans, corn, chick pea pasta, avocado, tom, on, cilantro), homemade tuna salad, veg's, salads, fruit.   Avoided foods: most breads, red meat, cheese, most added sugars (still eats [employer], Fiber One Honey Clusters cereal, ginger snaps & Fig Newtons, Hershey choc drops (<6/day)) .   Weight: 128 lb; height is 63".    Usual physical activity:  60 minutes Pilates ([employer]) 3 X week; walks 35-40 min ~1 X wk; calisthenics at home 2-3 intermittently.  Sleep: Estimates 9 hours/night.  Sleeps well.   Food security -  Within the past 12 months:    - Did you run out or worry that you would run out of food, and not  have money to get more?   No.    24-hr recall: (Up at 8 AM; drank water with metamucil) B (9 AM)-   1 slc toast, peach preserves Snk ( AM)-   11 oz Orgain protein drink.  (21 g pro) L (1 PM)-  1 c bean salad, 1/2 tbsp olive oil&vinegar, 1 chx tender (chick pea breading), 1 c string beans, 4 new potatoes, 1/2 c blackberries, water Snk (4 PM)-  2 Fig Newtons, 2 choc drops, water D (6:45 PM)-  1/2 Farmer[employer] Mkt chx salad, 1 c squash mixture, 1/2 c bean salad, 1 c watermelon, water Snk (9 PM)-  5-6 ginger snaps, water, 1 apple  Typical day? No. Breakfast is usually oatmeal, fruit, nuts, and seeds.    Nutritional Diagnosis:  NI-5.7.1 Inadequate protein intake As related to protein needs.  As evidenced by 24-hr food recall suggesting ~45 g protein yesterday.  Handouts given during visit include: After-Visit Summary (AVS) Recipes for hummus, tofu sour cream, and a meal planning form.    Demonstrated degree of understanding via:  Teach Back  Barriers to learning/adherence to lifestyle change: Longstanding dietary routines.    For behavioral goals and recommendations, see Patient Instructions.    Monitoring/Evaluation:  Dietary intake, exercise, and body weight in 4 week(s).

## 2023-05-25 ENCOUNTER — Ambulatory Visit (HOSPITAL_BASED_OUTPATIENT_CLINIC_OR_DEPARTMENT_OTHER): Payer: Medicare Other | Admitting: Obstetrics & Gynecology

## 2023-05-27 ENCOUNTER — Encounter: Payer: Self-pay | Admitting: Cardiology

## 2023-05-29 NOTE — Telephone Encounter (Signed)
From patient.

## 2023-05-29 NOTE — Telephone Encounter (Signed)
I understand that due to insurance reasons and your preference Nexlizet is not an option at this point.  You are already on the highest dose of Zetia.  Lets await the labs and if lipids are still elevated we may need to consider statin therapy versus PCSK9 inhibitors depending on insurance coverage.  Deidrick Rainey [place], DO, [facility]

## 2023-06-01 ENCOUNTER — Ambulatory Visit: Payer: Medicare Other

## 2023-06-07 ENCOUNTER — Ambulatory Visit: Payer: Medicare Other

## 2023-06-08 ENCOUNTER — Ambulatory Visit: Payer: Medicare Other

## 2023-06-09 ENCOUNTER — Ambulatory Visit: Admission: RE | Admit: 2023-06-09 | Payer: Medicare Other | Source: Ambulatory Visit

## 2023-06-09 DIAGNOSIS — Z1231 Encounter for screening mammogram for malignant neoplasm of breast: Secondary | ICD-10-CM

## 2023-06-12 ENCOUNTER — Ambulatory Visit (INDEPENDENT_AMBULATORY_CARE_PROVIDER_SITE_OTHER): Payer: Medicare Other | Admitting: Family Medicine

## 2023-06-12 VITALS — Ht 63.0 in

## 2023-06-12 DIAGNOSIS — E7800 Pure hypercholesterolemia, unspecified: Secondary | ICD-10-CM | POA: Diagnosis not present

## 2023-06-12 DIAGNOSIS — M8580 Other specified disorders of bone density and structure, unspecified site: Secondary | ICD-10-CM | POA: Diagnosis not present

## 2023-06-12 DIAGNOSIS — I10 Essential (primary) hypertension: Secondary | ICD-10-CM

## 2023-06-12 NOTE — Progress Notes (Signed)
Medical Nutrition Therapy PCP Antony Haste, MD Appt start time: 1100 end time: 1115 (15 minutes) Primary concerns today: Alexa [name] would like to better understand the best dietary approach for overall health and her conditions in particular.  She would like to minimize Rx medications.    Relevant history/background:  Alexa [name] was referred for MNT related to HTN, osteopenia, and hyperlipidemia.  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  She had had arrhymias during long-term cardiac monitoring in 2021, although a  11/28/22 TM stress test showed HR response was normal, and she achieved 6.6. METs.  Labs from 04/26/23: TC 225; Tg 123; HDL 49; LDL calc 151, Chol:HDL 4.6.  Lorese lives alone.    Assessment:  Alexa [name] has not done as well during the past month in preparing food in advance.  She was pleased to inform me, however, that her cancer blood work from [facility] on 05/31/23 was the best she's had since diagnosis.   Lab's of note from 05/31/23:  Potassium: 5.2 mmol/L (kidney-related labs all normal) WBC, neutrophils were slightly low and monocytes slightly high, as anticipated w/ her current cancer medications.   Lipid panel: TC 225 mg/dL; LDL, calc 784 mg/dL, HDL 47 mg/dL, and TG 696. She brought the Meal Planning Form today, which we completed together.     Learning Readiness: Ready  Recent eating pattern: 3 meals and 0-1 snack per day.  Weight: No wt chk today; (128 lb on 05/18/23; height is 63").     Usual physical activity:  60 minutes Pilates ([employer]) 3 X week (which includes strength training with personal trainer); no walking since last appt; calisthenics at home intermittently.  Sleep: Estimates 9 hours/night.  Sleeps well.   Food security -  Within the past 12 months: Did you run out or worry that you would run out of food, and not  have money to get more?   No.    24-hr recall not necessarily accurate, as pt was unsure re. mid-day:  (Up at 7 AM) B (9 AM)-  1 1/2 c watermelon Snk (10 AM)-  1 c peppermint tea with 2 tsp honey L ( PM)-  ??? Snk ( PM)-  water D (6 PM)-  ~2 oz rotisserie chx, 3/4 c pinkeyed peas, 3/4 c ratatouille, 3/4 c rice, water Snk (9 PM)-  1/2 lemon sugar cookie, water Typical day? No.  Had abdominal discomfort much of yesterday.    Nutritional Diagnosis: Slight progress on NI-5.7.1 Inadequate protein intake As related to protein needs.  As evidenced by patient's self-report.  Handouts given during visit include:   After-Visit Summary (AVS)  Demonstrated degree of understanding via:  Teach Back  Barriers to learning/adherence to lifestyle change: Longstanding dietary routines.    For behavioral goals and recommendations, see Patient Instructions.    Monitoring/Evaluation:  Dietary intake, exercise, and body weight in 6 week(s).

## 2023-06-12 NOTE — Patient Instructions (Signed)
Start using the Meal Planning Form completed today.    - Pay particular attention to advance prep of protein foods and vegetables; keep on hand (freezer?) some starch options.     - Put on your calendar a reminder to use freezer foods at least every 3 months.    Email Jeannie a reminder to send the beet soup recipe.    This week's goals:  1. Try out the blender 2. Order protein powder.  3. Get a big container of yogurt.   4. Make lentil-vegetable soup.   5. Finalize Meal Planning form, and print it out.    Specific Behavioral Goals:  1. Daily protein target: 60 to 70 grams/day. (RDA is only 48 g/day, but there is a lot of evidence to suggest 1.0-1.2 g/kg body weight is best for bone health.)             - Obtain 20 grams of protein per meal, which means each meal should have a food that you can identify as a protein food.   Obtain at least 20 grams of protein per meal.   1 egg or 1 ounce of meat, fish, poultry, or cheese provides about 7 grams of protein.  8 oz of milk or 2 tbsp peanut butter provides 8 grams of protein. 1 cup (8 oz) of most beans provides 15 grams of protein.    Yogurts vary greatly, but [place] yogurt is highest in protein.  NOTE: The size of a deck of cards is equal to about 3-4 ounces of meat, fish, or poultry.    2. Aim for getting half the volume of both lunch and dinner from vegetables (whether from raw or cooked).     Keep frozen veg's on hand for an easy, quick veg serving.      If using no-salt-added beans, do not rinse!     3. Do some type of strength training at least 3 X wk.  One way to approach this is to write out a few different workouts from whatever sources you can find online or from friends.  Continue to use your personal trainer can be invaluable in establishing a good routine.    Follow-up appt on Monday, Sept 16 at 11 AM.

## 2023-06-16 ENCOUNTER — Encounter: Payer: Self-pay | Admitting: Hematology

## 2023-06-16 ENCOUNTER — Other Ambulatory Visit: Payer: Self-pay

## 2023-06-16 DIAGNOSIS — C8408 Mycosis fungoides, lymph nodes of multiple sites: Secondary | ICD-10-CM

## 2023-06-19 ENCOUNTER — Inpatient Hospital Stay: Payer: Medicare Other | Attending: Hematology

## 2023-06-19 ENCOUNTER — Other Ambulatory Visit: Payer: Self-pay

## 2023-06-19 ENCOUNTER — Encounter (HOSPITAL_BASED_OUTPATIENT_CLINIC_OR_DEPARTMENT_OTHER): Payer: Self-pay | Admitting: Obstetrics & Gynecology

## 2023-06-19 ENCOUNTER — Ambulatory Visit (HOSPITAL_BASED_OUTPATIENT_CLINIC_OR_DEPARTMENT_OTHER): Payer: Medicare Other

## 2023-06-19 ENCOUNTER — Other Ambulatory Visit: Payer: Medicare Other

## 2023-06-19 ENCOUNTER — Telehealth: Payer: Self-pay

## 2023-06-19 DIAGNOSIS — C8408 Mycosis fungoides, lymph nodes of multiple sites: Secondary | ICD-10-CM | POA: Diagnosis present

## 2023-06-19 LAB — CBC WITH DIFFERENTIAL (CANCER CENTER ONLY)
Abs Immature Granulocytes: 0.01 10*3/uL (ref 0.00–0.07)
Basophils Absolute: 0 10*3/uL (ref 0.0–0.1)
Basophils Relative: 0 %
Eosinophils Absolute: 0.1 10*3/uL (ref 0.0–0.5)
Eosinophils Relative: 2 %
HCT: 34.3 % — ABNORMAL LOW (ref 36.0–46.0)
Hemoglobin: 12.6 g/dL (ref 12.0–15.0)
Immature Granulocytes: 0 %
Lymphocytes Relative: 27 %
Lymphs Abs: 0.7 10*3/uL (ref 0.7–4.0)
MCH: 34.2 pg — ABNORMAL HIGH (ref 26.0–34.0)
MCHC: 36.7 g/dL — ABNORMAL HIGH (ref 30.0–36.0)
MCV: 93.2 fL (ref 80.0–100.0)
Monocytes Absolute: 0.4 10*3/uL (ref 0.1–1.0)
Monocytes Relative: 14 %
Neutro Abs: 1.5 10*3/uL — ABNORMAL LOW (ref 1.7–7.7)
Neutrophils Relative %: 57 %
Platelet Count: 157 10*3/uL (ref 150–400)
RBC: 3.68 MIL/uL — ABNORMAL LOW (ref 3.87–5.11)
RDW: 12.1 % (ref 11.5–15.5)
WBC Count: 2.6 10*3/uL — ABNORMAL LOW (ref 4.0–10.5)
nRBC: 0 % (ref 0.0–0.2)

## 2023-06-19 LAB — CMP (CANCER CENTER ONLY)
ALT: 10 U/L (ref 0–44)
AST: 20 U/L (ref 15–41)
Albumin: 4.1 g/dL (ref 3.5–5.0)
Alkaline Phosphatase: 65 U/L (ref 38–126)
Anion gap: 6 (ref 5–15)
BUN: 15 mg/dL (ref 8–23)
CO2: 30 mmol/L (ref 22–32)
Calcium: 9.3 mg/dL (ref 8.9–10.3)
Chloride: 104 mmol/L (ref 98–111)
Creatinine: 0.93 mg/dL (ref 0.44–1.00)
GFR, Estimated: >60 mL/min (ref >60)
Glucose, Bld: 83 mg/dL (ref 70–99)
Potassium: 4.3 mmol/L (ref 3.5–5.1)
Sodium: 140 mmol/L (ref 135–145)
Total Bilirubin: 0.4 mg/dL (ref 0.3–1.2)
Total Protein: 7.8 g/dL (ref 6.5–8.1)

## 2023-06-19 NOTE — Telephone Encounter (Signed)
Patient called stating that she was exposed to covid this weekend and would like to reschedule her lab appointment to be safe. Denies having symptoms at this time, will test before she comes in next week for labs.

## 2023-06-20 ENCOUNTER — Ambulatory Visit (INDEPENDENT_AMBULATORY_CARE_PROVIDER_SITE_OTHER): Payer: Medicare Other | Admitting: Sports Medicine

## 2023-06-20 VITALS — BP 138/86 | Ht 63.5 in | Wt 129.0 lb

## 2023-06-20 DIAGNOSIS — M25511 Pain in right shoulder: Secondary | ICD-10-CM | POA: Diagnosis not present

## 2023-06-20 NOTE — Progress Notes (Signed)
   Subjective:    Patient ID: Alexa [name], female    DOB: 1951/05/07, 72 y.o.   MRN: [id number]  HPI  Alexa [name] presents today for follow-up on right shoulder pain and tightness.  She has noticed improvement with physical therapy.  She admits that she is still not 100% but is improving.  Her next physical therapy visit is this Friday.    Review of Systems As above    Objective:   Physical Exam  Developed, well-nourished.  No acute distress  Right shoulder: Full forward flexion and nearly full abduction.  Good external rotation.  Still some limitation with internal rotation.  Good strength.  Pulses intact distally.      Assessment & Plan:   Improving right shoulder pain  I encouraged Alexa [name] to continue with physical therapy until the therapist feels like she is ready for discharge to home exercise program.  At this point in time we will leave things open-ended for her to follow-up with me as needed.  This note was dictated using Dragon naturally speaking software and may contain errors in syntax, spelling, or content which have not been identified prior to signing this note.

## 2023-06-21 ENCOUNTER — Ambulatory Visit (INDEPENDENT_AMBULATORY_CARE_PROVIDER_SITE_OTHER): Payer: Medicare Other

## 2023-06-21 ENCOUNTER — Ambulatory Visit: Payer: Medicare Other | Admitting: Sports Medicine

## 2023-06-21 ENCOUNTER — Encounter (HOSPITAL_BASED_OUTPATIENT_CLINIC_OR_DEPARTMENT_OTHER): Payer: Self-pay

## 2023-06-21 VITALS — BP 107/66 | HR 60 | Ht 63.5 in | Wt 129.6 lb

## 2023-06-21 DIAGNOSIS — R350 Frequency of micturition: Secondary | ICD-10-CM | POA: Diagnosis not present

## 2023-06-21 DIAGNOSIS — R109 Unspecified abdominal pain: Secondary | ICD-10-CM

## 2023-06-21 LAB — POCT URINALYSIS DIPSTICK
Bilirubin, UA: NEGATIVE
Blood, UA: NEGATIVE
Glucose, UA: NEGATIVE
Ketones, UA: NEGATIVE
Nitrite, UA: NEGATIVE
Protein, UA: NEGATIVE
Spec Grav, UA: 1.02 (ref 1.010–1.025)
Urobilinogen, UA: 0.2 E.U./dL
pH, UA: 5.5 (ref 5.0–8.0)

## 2023-06-21 NOTE — Progress Notes (Addendum)
Patient came in today with complaints of pressure and urgency. Patient gave a urine sample for evaluation. Sample sent for evaluation. tbw

## 2023-06-21 NOTE — Telephone Encounter (Signed)
Done

## 2023-06-22 ENCOUNTER — Encounter (HOSPITAL_BASED_OUTPATIENT_CLINIC_OR_DEPARTMENT_OTHER): Payer: Self-pay | Admitting: Obstetrics & Gynecology

## 2023-06-22 ENCOUNTER — Other Ambulatory Visit (HOSPITAL_BASED_OUTPATIENT_CLINIC_OR_DEPARTMENT_OTHER): Payer: Self-pay | Admitting: Obstetrics & Gynecology

## 2023-06-22 MED ORDER — NITROFURANTOIN MONOHYD MACRO 100 MG PO CAPS: 100.0000 mg | ORAL_CAPSULE | Freq: Two times a day (BID) | ORAL | 0 refills | Status: DC

## 2023-06-23 ENCOUNTER — Encounter (HOSPITAL_BASED_OUTPATIENT_CLINIC_OR_DEPARTMENT_OTHER): Payer: Self-pay | Admitting: Obstetrics & Gynecology

## 2023-06-23 ENCOUNTER — Ambulatory Visit (INDEPENDENT_AMBULATORY_CARE_PROVIDER_SITE_OTHER): Payer: Medicare Other | Admitting: Obstetrics & Gynecology

## 2023-06-23 VITALS — BP 130/81 | HR 69 | Ht 63.5 in | Wt 129.4 lb

## 2023-06-23 DIAGNOSIS — C19 Malignant neoplasm of rectosigmoid junction: Secondary | ICD-10-CM

## 2023-06-23 DIAGNOSIS — I10 Essential (primary) hypertension: Secondary | ICD-10-CM | POA: Diagnosis not present

## 2023-06-23 DIAGNOSIS — M810 Age-related osteoporosis without current pathological fracture: Secondary | ICD-10-CM

## 2023-06-23 DIAGNOSIS — Z01419 Encounter for gynecological examination (general) (routine) without abnormal findings: Secondary | ICD-10-CM

## 2023-06-23 DIAGNOSIS — C8590 Non-Hodgkin lymphoma, unspecified, unspecified site: Secondary | ICD-10-CM

## 2023-06-23 DIAGNOSIS — N3000 Acute cystitis without hematuria: Secondary | ICD-10-CM | POA: Diagnosis not present

## 2023-06-23 DIAGNOSIS — E7800 Pure hypercholesterolemia, unspecified: Secondary | ICD-10-CM | POA: Diagnosis not present

## 2023-06-23 NOTE — Progress Notes (Signed)
Patient was told that we could call in the Protocol medication for her due to the symptoms that she was experiencing. Patient declined, would like to wait on urine culture results to come back. tbw

## 2023-06-23 NOTE — Progress Notes (Signed)
72 y.o. G1P0 Single White or Caucasian female here for breast and pelvic exam.  I am also following her for breast and pelvic exam.  Denies vaginal bleeding.  She was seen this week for possible UTI.  She did have WBCs in her urine.  Culture is pending.  Rx was sent to the pharmacy and she will get it today and go ahead and start this.   She is managed at [facility] for cutaneous T cell lymphoma.  Blood work was really good but her LDLs are elevated.  Cardiologist recommended a statin.  She is on Zetia but doesn't really feel it's doing much.  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  Continues to have concerns that the statin was related to the cancer.  Has not had a coronary CT.  Would like to see a cardiologist that she can have conversation with regarding options, risks.  Patient's last menstrual period was 11/07/2002.          Sexually active: No.  H/O STD:  no  Health Maintenance: PCP:  Dr. Cyndia Bent.  She is going to look for new PCP due to  Vaccines are up to date:  reviewed Colonoscopy:  05/12/2021 MMG:  06/09/2023 Negative BMD:  05/11/2022 Worsening osteoporosis in the right hip.  Plan to repeat next year.   Last pap smear:  05/26/2022 Negative.   H/o abnormal pap smear:  remote hx   reports that she has never smoked. She has never used smokeless tobacco. She reports that she does not currently use alcohol. She reports that she does not use drugs.  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 @ [facility]   Family history of anesthesia complication    sister and sister's children postitive for MH!!!!!!, pt tested negative 3-34yrs ago   GERD (gastroesophageal reflux disease)    History of hypertension    off medication after weight loss   Hyperlipemia    Lymphoma (HCC)    light box therapy   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HCC)    Osteopenia    bilateral hips   Rectal cancer (HCC) 04/2017    Past Surgical  History:  Procedure Laterality Date   BREAST BIOPSY     COLONOSCOPY  03/07/2012   with biopsy   DILATATION & CURRETTAGE/HYSTEROSCOPY WITH RESECTOCOPE N/A 06/02/2014   Procedure: DILATATION & CURETTAGE/HYSTEROSCOPY WITH RESECTOCOPE;  Surgeon: Annamaria Boots, MD;  Location: [facility];  Service: Gynecology;  Laterality: N/A;   FOOT SURGERY     Right    TRANSANAL EXCISION OF RECTAL MASS N/A 06/09/2017   Procedure: TRANSANAL EXCISION OF RECTAL MASS;  Surgeon: Romie Levee, MD;  Location: [facility] [place];  Service: General;  Laterality: N/A;    Current Outpatient Medications  Medication Sig Dispense Refill   B Complex Vitamins (VITAMIN B COMPLEX) TABS Take by mouth. 5000 UT     bexarotene (TARGRETIN) 75 MG CAPS capsule Take 75 mg by mouth daily.     cetirizine (ZYRTEC) 10 MG tablet Take 10 mg by mouth daily.     Cholecalciferol (VITAMIN D) 125 MCG (5000 UT) CAPS      Cranberry 1000 MG CAPS Take by mouth.     famotidine (PEPCID) 20 MG tablet Take 20 mg by mouth as needed.     Menaquinone-7 (K2) 90 MCG/0.5ML LIQD Take 180 mcg by mouth daily.     nitrofurantoin, macrocrystal-monohydrate, (  MACROBID) 100 MG capsule Take 1 capsule (100 mg total) by mouth 2 (two) times daily. 10 capsule 0   Omega-3 Fatty Acids (FISH OIL) 500 MG CAPS Take 1,500 mg by mouth.     PEGASYS 180 MCG/ML injection Inject 180 mcg into the skin every 7 (seven) days.     Probiotic Product (PROBIOTIC DAILY PO) Take by mouth.     tacrolimus (PROTOPIC) 0.1 % ointment      triamcinolone cream (KENALOG) 0.1 %      amLODipine (NORVASC) 5 MG tablet Take 1 tablet (5 mg total) by mouth daily. 90 tablet 3   metoprolol tartrate (LOPRESSOR) 25 MG tablet Take 0.5 tablets (12.5 mg total) by mouth daily.     No current facility-administered medications for this visit.    Family History  Problem Relation Age of Onset   Heart attack Mother    Heart disease Mother    Kidney disease Mother    Kidney cancer Mother         contained tumor   Hypertension Mother    Anuerysm Father    Heart disease Father    Diabetes Father    Cancer Father        unknown type   Hypertension Father    Hypertension Sister    Heart attack Brother    Cancer Brother        liver cancer   Melanoma Brother    Other Brother        Bypass surgery    Ovarian cancer Paternal Grandmother        was PMP   Heart attack Nephew    Colon cancer Neg Hx     Review of Systems  Constitutional: Negative.   Genitourinary: Negative.     Exam:   BP 130/81 (BP Location: Right Arm, Patient Position: Sitting, Cuff Size: Normal)   Pulse 69   Ht 5' 3.5" (1.613 m) Comment: Reported  Wt 129 lb 6.4 oz (58.7 kg)   LMP 11/07/2002   BMI 22.56 kg/m   Height: 5' 3.5" (161.3 cm) (Reported)  General appearance: alert, cooperative and appears stated age Breasts: normal appearance, no masses or tenderness Abdomen: soft, non-tender; bowel sounds normal; no masses,  no organomegaly Lymph nodes: Cervical, supraclavicular, and axillary nodes normal.  No abnormal inguinal nodes palpated Neurologic: Grossly normal  Pelvic: External genitalia:  no lesions              Urethra:  normal appearing urethra with no masses, tenderness or lesions              Bartholins and Skenes: normal                 Vagina: normal appearing vagina with atrophic changes and no discharge, no lesions              Cervix: no lesions              Pap taken: No. Bimanual Exam:  Uterus:  normal size, contour, position, consistency, mobility, non-tender              Adnexa: normal adnexa and no mass, fullness, tenderness               Rectovaginal: Confirms               Anus:  normal sphincter tone, no lesions  Chaperone, Ina Homes, CMA, was present for exam.  Assessment/Plan: 1. Encntr for gyn exam (general) (routine) w/o abn findings -  Pap smear 05/26/2022.  Not indicated today. - Mammogram 06/09/2023 - Colonoscopy 05/12/2021.  Has just seen Dr. Loreta Ave this year. - Bone  mineral density 05/11/2022.   - lab work:  none ordered today - vaccines reviewed/updated  2. Elevated LDL cholesterol level - Ambulatory referral to Cardiology  3. Colorectal cancer, stage I (HCC) - followed by Dr. Loreta Ave  4. Hypertension, essential  5. Acute cystitis without hematuria - aware rx sent in for her and she will get and start taking  6. T-cell lymphoma (HCC), cutaneous - followed at [facility] with Dr. Sharman Crate  7. Age-related osteoporosis without current pathological fracture - not interested in treatment at this time and will repeat BMD next year.

## 2023-06-24 LAB — URINE CULTURE

## 2023-06-26 ENCOUNTER — Other Ambulatory Visit: Payer: Medicare Other

## 2023-06-28 ENCOUNTER — Other Ambulatory Visit: Payer: Self-pay

## 2023-07-10 ENCOUNTER — Encounter (HOSPITAL_BASED_OUTPATIENT_CLINIC_OR_DEPARTMENT_OTHER): Payer: Self-pay | Admitting: Obstetrics & Gynecology

## 2023-07-24 ENCOUNTER — Ambulatory Visit: Payer: Medicare Other | Admitting: Family Medicine

## 2023-08-04 ENCOUNTER — Other Ambulatory Visit: Payer: Self-pay

## 2023-08-07 ENCOUNTER — Ambulatory Visit (INDEPENDENT_AMBULATORY_CARE_PROVIDER_SITE_OTHER): Payer: Medicare Other | Admitting: Family Medicine

## 2023-08-07 DIAGNOSIS — I10 Essential (primary) hypertension: Secondary | ICD-10-CM | POA: Diagnosis not present

## 2023-08-07 DIAGNOSIS — E7800 Pure hypercholesterolemia, unspecified: Secondary | ICD-10-CM

## 2023-08-07 DIAGNOSIS — M8580 Other specified disorders of bone density and structure, unspecified site: Secondary | ICD-10-CM | POA: Diagnosis not present

## 2023-08-07 NOTE — Progress Notes (Signed)
Medical Nutrition Therapy PCP Antony Haste, MD Appt start time: 1100 end time: 1115 (15 minutes) Primary concerns today: Alexa [name] would like to better understand the best dietary approach for overall health and her conditions in particular.  She would like to minimize Rx medications.    Relevant history/background:  Alexa [name] was referred for MNT related to HTN, osteopenia, and hyperlipidemia.  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  She had had arrhymias during long-term cardiac monitoring in 2021, although a  11/28/22 TM stress test showed HR response was normal, and she achieved 6.6. METs.  Lab's of note from 05/31/23:  Potassium: 5.2 mmol/L (kidney-related labs all normal) WBC, neutrophils were slightly low and monocytes slightly high, as anticipated w/ her current cancer medications.   Lipid panel: TC 225 mg/dL; LDL, calc 213 mg/dL, HDL 47 mg/dL, and TG 086.   Assessment:  Alexa [name] is frustrated that she has not followed through on intended behaviors, especially with respect to food choices.  She has started making overnight oats, however, and is making high-protein smoothies for lunch/dinner (sometimes for snack) 2-3 X wk.  Pelvic, abdomen, and lung scans last month indicated scarring and a small nodule in her R lung, some diverticuli in her colon.  Gallbladder and appendix were normal, although liver had some fatty infiltration, believed to be related to her cancer meds.    Recent eating pattern: 3 meals and 0-1 snack per day.  Weight: 129 lb (self-report); (128 lb on 05/18/23; height is 63").    Usual physical activity:  60 minutes Pilates ([employer]) 3 X week (which includes strength training with personal trainer); no walking since last appt; intermittent calisthenics at home.   Sleep: Estimates 9 hours/night.  Food security -   Within the past 12 months: Did you run out or worry that you would run out of food, and not have money to get more?   No.    Did not complete food recall.  Nutritional Diagnosis: No progress on NI-5.7.1 Inadequate protein intake As related to protein needs.  As evidenced by patient report of not meeting protein or veg goals.   Handouts given during visit include:   After-Visit Summary (AVS)  For behavioral goals and recommendations, see Patient Instructions.    Monitoring/Evaluation:  Dietary intake, exercise, and body weight in 4 week(s).

## 2023-08-07 NOTE — Patient Instructions (Signed)
Cinnamon:  Cassia cinnamon (most common kind) contains a lot of coumarin, which can be hepatotoxic, so best to use Ceylon cinnamon, if using daily.  Fatty liver recommendations:  1. Choose carbohydrate foods that have fiber, and minimize refined carbs and sugar.  Generally, highly processed foods have more refined carbs.  Even when the label indicates "whole grains," the starch in that product might have been processed in a way that actually breaks up some of the starch molecules.    This week's goals:  1. Prepare cabbage today.   2. Finalize Meal Planning form, and print it out.   3. Choose a recipe from your Meal Planning form to make; plan when you are going shopping for ingredients.    Specific Behavioral Goals:  1. Daily protein target: 60 to 70 grams/day. (RDA is only 48 g/day, but there is a lot of evidence to suggest 1.0-1.2 g/kg body weight is best for bone health.)             - Obtain 20 grams of protein per meal, which means each meal should have a food that you can identify as a protein food.    - Suggestion: mix some protein powder into your oats.   Obtain at least 20 grams of protein per meal.   1 egg or 1 ounce of meat, fish, poultry, or cheese provides about 7 grams of protein.  8 oz of milk or 2 tbsp peanut butter provides 8 grams of protein. 1 cup (8 oz) of most beans provides 15 grams of protein.    Yogurts vary greatly, but [place] yogurt is highest in protein.  NOTE: The size of a deck of cards is equal to about 3-4 ounces of meat, fish, or poultry.   2. Walk early in the day at least 2 X week.    - Ask Coy Saunas for a commitment to walk at least weekly; establish a process by which she chooses the day that week, so you then choose your second day.    - Schedule walking on your calendar.    - Use the 5-minute rule of exercise.  (Promise yourself to do at least 5 minutes of exercise.)    - Document in your notebook how many minutes you walk each time.    3. Do some type of  strength training at least 1 X wk (in addn to pilates).   - Choose a consistent day and time for your strength workout, and schedule that io your calendar (Wed after breakfast, ~9 AM).     - Write some workouts from whatever sources you can find online or from friends.  For example: Reps are 30, 20, 10 per round:  Squats, sit-ups, bent-over rows, lunges, knee push-ups.   - Record your time and resistance used in each workout.    Follow-up appt on Monday, October 28 at 1:30.

## 2023-08-08 ENCOUNTER — Other Ambulatory Visit: Payer: Self-pay

## 2023-08-08 DIAGNOSIS — E7849 Other hyperlipidemia: Secondary | ICD-10-CM

## 2023-08-08 DIAGNOSIS — C8408 Mycosis fungoides, lymph nodes of multiple sites: Secondary | ICD-10-CM

## 2023-08-11 ENCOUNTER — Ambulatory Visit: Payer: 59 | Admitting: Cardiology

## 2023-08-11 ENCOUNTER — Inpatient Hospital Stay: Payer: Medicare Other | Attending: Hematology

## 2023-08-11 DIAGNOSIS — Z79899 Other long term (current) drug therapy: Secondary | ICD-10-CM | POA: Insufficient documentation

## 2023-08-11 DIAGNOSIS — C8409 Mycosis fungoides, extranodal and solid organ sites: Secondary | ICD-10-CM | POA: Insufficient documentation

## 2023-08-11 DIAGNOSIS — E7849 Other hyperlipidemia: Secondary | ICD-10-CM

## 2023-08-11 DIAGNOSIS — C8408 Mycosis fungoides, lymph nodes of multiple sites: Secondary | ICD-10-CM

## 2023-08-11 LAB — CBC WITH DIFFERENTIAL (CANCER CENTER ONLY)
Abs Immature Granulocytes: 0 10*3/uL (ref 0.00–0.07)
Basophils Absolute: 0 10*3/uL (ref 0.0–0.1)
Basophils Relative: 0 %
Eosinophils Absolute: 0 10*3/uL (ref 0.0–0.5)
Eosinophils Relative: 1 %
HCT: 36.6 % (ref 36.0–46.0)
Hemoglobin: 12.6 g/dL (ref 12.0–15.0)
Immature Granulocytes: 0 %
Lymphocytes Relative: 27 %
Lymphs Abs: 0.6 10*3/uL — ABNORMAL LOW (ref 0.7–4.0)
MCH: 33.2 pg (ref 26.0–34.0)
MCHC: 34.4 g/dL (ref 30.0–36.0)
MCV: 96.6 fL (ref 80.0–100.0)
Monocytes Absolute: 0.3 10*3/uL (ref 0.1–1.0)
Monocytes Relative: 14 %
Neutro Abs: 1.3 10*3/uL — ABNORMAL LOW (ref 1.7–7.7)
Neutrophils Relative %: 58 %
Platelet Count: 155 10*3/uL (ref 150–400)
RBC: 3.79 MIL/uL — ABNORMAL LOW (ref 3.87–5.11)
RDW: 12.4 % (ref 11.5–15.5)
WBC Count: 2.2 10*3/uL — ABNORMAL LOW (ref 4.0–10.5)
nRBC: 0 % (ref 0.0–0.2)

## 2023-08-11 LAB — CMP (CANCER CENTER ONLY)
ALT: 10 U/L (ref 0–44)
AST: 20 U/L (ref 15–41)
Albumin: 4.3 g/dL (ref 3.5–5.0)
Alkaline Phosphatase: 59 U/L (ref 38–126)
Anion gap: 7 (ref 5–15)
BUN: 19 mg/dL (ref 8–23)
CO2: 27 mmol/L (ref 22–32)
Calcium: 9.5 mg/dL (ref 8.9–10.3)
Chloride: 104 mmol/L (ref 98–111)
Creatinine: 0.95 mg/dL (ref 0.44–1.00)
GFR, Estimated: >60 mL/min (ref >60)
Glucose, Bld: 94 mg/dL (ref 70–99)
Potassium: 4.5 mmol/L (ref 3.5–5.1)
Sodium: 138 mmol/L (ref 135–145)
Total Bilirubin: 0.5 mg/dL (ref 0.3–1.2)
Total Protein: 8.1 g/dL (ref 6.5–8.1)

## 2023-08-11 LAB — LIPID PANEL
Cholesterol: 213 mg/dL — ABNORMAL HIGH (ref 0–200)
HDL: 48 mg/dL (ref >40)
LDL Cholesterol: 137 mg/dL — ABNORMAL HIGH (ref 0–99)
Total CHOL/HDL Ratio: 4.4 {ratio}
Triglycerides: 142 mg/dL (ref <150)
VLDL: 28 mg/dL (ref 0–40)

## 2023-08-12 LAB — T4: T4, Total: 8.7 ug/dL (ref 4.5–12.0)

## 2023-09-04 ENCOUNTER — Ambulatory Visit: Payer: Medicare Other | Admitting: Family Medicine

## 2023-09-12 ENCOUNTER — Ambulatory Visit: Payer: Medicare Other | Admitting: Family Medicine

## 2023-09-12 ENCOUNTER — Ambulatory Visit (INDEPENDENT_AMBULATORY_CARE_PROVIDER_SITE_OTHER): Payer: Medicare Other | Admitting: Family Medicine

## 2023-09-12 VITALS — Ht 63.0 in

## 2023-09-12 DIAGNOSIS — E7800 Pure hypercholesterolemia, unspecified: Secondary | ICD-10-CM | POA: Diagnosis not present

## 2023-09-12 NOTE — Progress Notes (Unsigned)
Medical Nutrition Therapy PCP Antony Haste, MD Appt start time: 1330 end time: 1345 (15 minutes) Primary concerns today: Alexa [name] would like to better understand the best dietary approach for overall health and her conditions in particular.  She would like to minimize Rx medications.    Relevant history/background:  Alexa [name] was referred for MNT related to HTN, osteopenia, and hyperlipidemia.  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  She had had arrhymias during long-term cardiac monitoring in 2021, although a  11/28/22 TM stress test showed HR response was normal, and she achieved 6.6. METs.  Lab's of note from 05/31/23:  Potassium: 5.2 mmol/L (kidney-related labs all normal) WBC, neutrophils were slightly low and monocytes slightly high, as anticipated w/ her current cancer medications.   Lipid panel: TC 225 mg/dL; LDL, calc 409 mg/dL, HDL 47 mg/dL, and TG 811.   Assessment:  Alexa [name] started doing well with respect to getting adequate protein.  Despite protein intake having decreased some in the past couple of weeks, intake remains higher than it was previously.    Recent eating pattern: 3 meals and 0-1 snack per day.  Weight: No wt check per patient preference; (129 lb on 08/07/23; height is 63").    Usual physical activity:  60 minutes Pilates ([employer]) 3 X week (which includes individual session with personal trainer); walking 3-40 minutes 1 X wk; ~20 minutes strength exercise 1 X wk.  Has also consistently moved intermittently during the day.  Sleep: Estimates 9 hours/night.  Food security -  Within the past 12 months: Did you run out or worry that you would run out of food, and not have money to get more?   No.   Progress on Goals - NOT documenting progress:  20 g protein/meal: ~6-9 meals/wk. Walk 2 X wk:  1 X wk. Str  training 1 X wk:  1 X wk home strength training.  24-hr recall:  (Up at 8 AM; drank water) B (9:30 AM)-  1 slc cinnamon-raisin toast, 1 1/2 tbsp peanut butter, 2 1/2 oz low-sugar lemon Grk yogurt, hot tea, 2 tsp honey - Drove home from sister's house in [place] -  Snk ( AM)-  --- L (12 PM)-  1/2 chx sandwich, water Snk (3 PM)-  1 apple, water Snk (5:30)-  2 thin slices deli [place] D (7 PM)-  1/2 Dave's Killer bagel, 2% cheese, water Snk (8 PM)-  2 Fig Newtons, water Typical day? No.  Was driving home yesterday; no food in the house b/c of traveling.   Nutritional Diagnosis:  Progress noted on NI-5.7.1 Inadequate protein intake As related to protein needs.  As evidenced by incorporation of some additional exercise weekly.   Handouts given during visit include:   After-Visit Summary (AVS)  For behavioral goals and recommendations, see Patient Instructions.    Monitoring/Evaluation:  Dietary intake, exercise, and body weight  as needed; referred to [facility], as I will be retired by January 2025 .

## 2023-09-12 NOTE — Patient Instructions (Addendum)
Suggestions:  - Stock up on:     - Frozen vegetables (for easy, quick options for veg's any time).    - Canned beans.     - Canned (reduced sodium) soups; when using canned soup, first microwave frozen veg's, then add soup, and re-heat.     - Priorities:  Is it more important for you to shop for the food pantry than for your own groceries? - Preparing for travel: Before you leave, plan what foods you need to use up, as well as foods that you can freeze, and what foods you can bring with you.  In addition, think about what foods you can buy or bring with you when you travel home.    Specific Behavioral Goals:  1. Daily protein target: 60 to 70 grams/day. (RDA is only 48 g/day, but there is a lot of evidence to suggest 1.0-1.2 g/kg body weight is best for bone health.)             - Obtain 20 grams of protein per meal, which means each meal should have a food that you can identify as a protein food.               - Suggestion: mix some protein powder into your oats.   Obtain at least 20 grams of protein per meal.   1 egg or 1 ounce of meat, fish, poultry, or cheese provides about 7 grams of protein.  8 oz of milk or 2 tbsp peanut butter provides 8 grams of protein. 1 cup (8 oz) of most beans provides 15 grams of protein.    Yogurts vary greatly, but [place] yogurt is highest in protein.  NOTE: The size of a deck of cards is equal to about 3-4 ounces of meat, fish, or poultry.   2. Walk early in the day at least 2 X week.               - Schedule walking on your calendar.    - Establish a routine of walking one of Margo's dogs 1-2 X wk.              - Use the 5-minute rule of exercise.  (Promise yourself to do at least 5 minutes of exercise.)               - Document in your notebook how many minutes you walk each time.    3. Do some type of strength training at least 1 X wk (in addn to pilates).   - Choose a consistent day and time for your strength workout; schedule it in your calendar (Wed  after breakfast, ~9 AM).     - Write out some workouts from whatever sources you can find online or from friends.  For example: Reps are 30, 20, 10 per round of squats, sit-ups, bent-over rows, lunges, knee push-ups.              - Record your time and resistance used in each workout.    Follow-up appt as needed with [facility]: [phone].

## 2023-09-13 ENCOUNTER — Ambulatory Visit: Payer: Medicare Other | Admitting: Family Medicine

## 2023-09-21 ENCOUNTER — Encounter (HOSPITAL_BASED_OUTPATIENT_CLINIC_OR_DEPARTMENT_OTHER): Payer: Self-pay | Admitting: Cardiology

## 2023-09-21 ENCOUNTER — Ambulatory Visit (HOSPITAL_BASED_OUTPATIENT_CLINIC_OR_DEPARTMENT_OTHER): Payer: Medicare Other | Admitting: Cardiology

## 2023-09-21 VITALS — BP 130/70 | HR 63 | Ht 63.0 in | Wt 131.6 lb

## 2023-09-21 DIAGNOSIS — I2510 Atherosclerotic heart disease of native coronary artery without angina pectoris: Secondary | ICD-10-CM | POA: Diagnosis not present

## 2023-09-21 DIAGNOSIS — Z712 Person consulting for explanation of examination or test findings: Secondary | ICD-10-CM | POA: Diagnosis not present

## 2023-09-21 DIAGNOSIS — E782 Mixed hyperlipidemia: Secondary | ICD-10-CM | POA: Diagnosis not present

## 2023-09-21 DIAGNOSIS — Z7189 Other specified counseling: Secondary | ICD-10-CM

## 2023-09-21 MED ORDER — PRAVASTATIN SODIUM 20 MG PO TABS
10.0000 mg | ORAL_TABLET | Freq: Every day | ORAL | 3 refills | Status: DC
Start: 2023-09-21 — End: 2024-03-22

## 2023-09-21 NOTE — Patient Instructions (Addendum)
Medication Instructions:  START PRAVASTATIN 20 MG 1/2 tablet once a week and increase to daily as tolerated    STOP ZETIA  *If you need a refill on your cardiac medications before your next appointment, please call your pharmacy*  Lab Work: AS SCHEDULED    Testing/Procedures: NONE  Follow-Up: At [facility], you and your health needs are our priority.  As part of our continuing mission to provide you with exceptional heart care, we have created designated Provider Care Teams.  These Care Teams include your primary Cardiologist (physician) and Advanced Practice Providers (APPs -  Physician Assistants and Nurse Practitioners) who all work together to provide you with the care you need, when you need it.  We recommend signing up for the patient portal called "MyChart".  Sign up information is provided on this After Visit Summary.  MyChart is used to connect with patients for Virtual Visits (Telemedicine).  Patients are able to view lab/test results, encounter notes, upcoming appointments, etc.  Non-urgent messages can be sent to your provider as well.   To learn more about what you can do with MyChart, go to [email or web address].    Your next appointment:   6 month(s)  The format for your next appointment:   In Person  Provider:   Jodelle Red, MD

## 2023-09-21 NOTE — Progress Notes (Signed)
Cardiology Office Note:  .   Date:  09/21/2023  ID:  Alexa [name], DOB 1951-06-02, MRN [id number] PCP: Ladora Daniel, PA-C  [facility] Providers Cardiologist:  None {  History of Present Illness: .   Alexa [name] is a 72 y.o. female with PMH hypertension, hyperlipidemia, palpitations, T cell lymphoma and colorectal cancer.   Pertinent CV history:Calcium score in 2022 was 34.2, which was 63rd percentile. She had declined statins, has tolerated ezetimibe.  Today: Previously seen by Dr. Jacinto Halim and then Dr. Odis Hollingshead. Wishes to be followed at [facility] and transferring care today.  Family history: mother, father, and two brothers have HTN. Two sisters have mildly elevated BP. Mother died age 54 of MI, had not had known CAD prior. Father in his 39s had MI, then had CHF later in life. Died from an aneurysm. Oldest brother had MI in his early 35s. Youngest brother had 4V CABG in his early 63s.   We reviewed her prior workups. She prefers to try to manage with diet (doing high fiber now). Tried a statin around 2016 and had joint aches.  We discussed calcium score and guidelines at length today. Recent lipids reviewed, Tchol 213, HDL 48, LDL 137, TG 142 in 08/2023. Has upcoming bloodwork including cholesterol.  She exercises, eats well.   ROS: Denies chest pain, shortness of breath at rest or with normal exertion. No PND, orthopnea, LE edema or unexpected weight gain. No syncope or palpitations. ROS otherwise negative except as noted.   Studies Reviewed: Marland Kitchen    EKG:       Physical Exam:   VS:  BP 130/70   Pulse 63   Ht 5\' 3"  (1.6 m)   Wt 131 lb 9.6 oz (59.7 kg)   LMP 11/07/2002   SpO2 97%   BMI 23.31 kg/m    Wt Readings from Last 3 Encounters:  09/21/23 131 lb 9.6 oz (59.7 kg)  06/23/23 129 lb 6.4 oz (58.7 kg)  06/21/23 129 lb 9.6 oz (58.8 kg)    GEN: Well nourished, well developed in no acute distress HEENT: Normal, moist mucous membranes NECK: No  JVD CARDIAC: regular rhythm, normal S1 and S2, no rubs or gallops. No murmur. VASCULAR: Radial and DP pulses 2+ bilaterally. No carotid bruits RESPIRATORY:  Clear to auscultation without rales, wheezing or rhonchi  ABDOMEN: Soft, non-tender, non-distended MUSCULOSKELETAL:  Ambulates independently SKIN: Warm and dry, no edema NEUROLOGIC:  Alert and oriented x 3. No focal neuro deficits noted. PSYCHIATRIC:  Normal affect    ASSESSMENT AND PLAN: .    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 We discussed options today at length. After shared decision making, she is open to trying low dose statin and uptitrating as tolerated -stop zetia -start pravastatin 10 mg once a week. Increase dose/frequency as tolerated  CV risk counseling and prevention -recommend heart healthy/Mediterranean diet, with whole grains, fruits, vegetable, fish, lean meats, nuts, and olive oil. Limit salt. -recommend moderate walking, 3-5 times/week for 30-50 minutes each session. Aim for at least 150 minutes.week. Goal should be pace of 3 miles/hours, or walking 1.5 miles in 30 minutes -recommend avoidance of tobacco products. Avoid excess alcohol.  Dispo: 6 mos or sooner as needed  Signed, Jodelle Red, MD   Jodelle Red, MD, PhD, [facility]  [facility]  [place]  [facility] at [facility] at [facility] [address] [place], [place] [address] [phone]

## 2023-09-22 ENCOUNTER — Encounter (HOSPITAL_BASED_OUTPATIENT_CLINIC_OR_DEPARTMENT_OTHER): Payer: Self-pay | Admitting: Cardiology

## 2023-10-09 ENCOUNTER — Ambulatory Visit (INDEPENDENT_AMBULATORY_CARE_PROVIDER_SITE_OTHER): Payer: Medicare Other

## 2023-10-12 ENCOUNTER — Other Ambulatory Visit: Payer: Self-pay

## 2023-10-12 DIAGNOSIS — E7849 Other hyperlipidemia: Secondary | ICD-10-CM

## 2023-10-12 DIAGNOSIS — C8408 Mycosis fungoides, lymph nodes of multiple sites: Secondary | ICD-10-CM

## 2023-10-20 ENCOUNTER — Inpatient Hospital Stay: Payer: Medicare Other | Attending: Hematology

## 2023-10-20 DIAGNOSIS — E7849 Other hyperlipidemia: Secondary | ICD-10-CM

## 2023-10-20 DIAGNOSIS — C8408 Mycosis fungoides, lymph nodes of multiple sites: Secondary | ICD-10-CM | POA: Diagnosis present

## 2023-10-20 DIAGNOSIS — Z79899 Other long term (current) drug therapy: Secondary | ICD-10-CM | POA: Diagnosis not present

## 2023-10-20 LAB — CBC WITH DIFFERENTIAL (CANCER CENTER ONLY)
Abs Immature Granulocytes: 0.01 10*3/uL (ref 0.00–0.07)
Basophils Absolute: 0 10*3/uL (ref 0.0–0.1)
Basophils Relative: 0 %
Eosinophils Absolute: 0 10*3/uL (ref 0.0–0.5)
Eosinophils Relative: 2 %
HCT: 36.4 % (ref 36.0–46.0)
Hemoglobin: 12.8 g/dL (ref 12.0–15.0)
Immature Granulocytes: 0 %
Lymphocytes Relative: 28 %
Lymphs Abs: 0.6 10*3/uL — ABNORMAL LOW (ref 0.7–4.0)
MCH: 33.8 pg (ref 26.0–34.0)
MCHC: 35.2 g/dL (ref 30.0–36.0)
MCV: 96 fL (ref 80.0–100.0)
Monocytes Absolute: 0.3 10*3/uL (ref 0.1–1.0)
Monocytes Relative: 13 %
Neutro Abs: 1.3 10*3/uL — ABNORMAL LOW (ref 1.7–7.7)
Neutrophils Relative %: 57 %
Platelet Count: 169 10*3/uL (ref 150–400)
RBC: 3.79 MIL/uL — ABNORMAL LOW (ref 3.87–5.11)
RDW: 12.2 % (ref 11.5–15.5)
WBC Count: 2.2 10*3/uL — ABNORMAL LOW (ref 4.0–10.5)
nRBC: 0 % (ref 0.0–0.2)

## 2023-10-20 LAB — LIPID PANEL
Cholesterol: 251 mg/dL — ABNORMAL HIGH (ref 0–200)
HDL: 63 mg/dL (ref >40)
LDL Cholesterol: 163 mg/dL — ABNORMAL HIGH (ref 0–99)
Total CHOL/HDL Ratio: 4 {ratio}
Triglycerides: 123 mg/dL (ref <150)
VLDL: 25 mg/dL (ref 0–40)

## 2023-10-20 LAB — CMP (CANCER CENTER ONLY)
ALT: 12 U/L (ref 0–44)
AST: 22 U/L (ref 15–41)
Albumin: 4.3 g/dL (ref 3.5–5.0)
Alkaline Phosphatase: 62 U/L (ref 38–126)
Anion gap: 6 (ref 5–15)
BUN: 16 mg/dL (ref 8–23)
CO2: 29 mmol/L (ref 22–32)
Calcium: 9.2 mg/dL (ref 8.9–10.3)
Chloride: 103 mmol/L (ref 98–111)
Creatinine: 0.8 mg/dL (ref 0.44–1.00)
GFR, Estimated: >60 mL/min (ref >60)
Glucose, Bld: 93 mg/dL (ref 70–99)
Potassium: 3.9 mmol/L (ref 3.5–5.1)
Sodium: 138 mmol/L (ref 135–145)
Total Bilirubin: 0.5 mg/dL (ref <1.2)
Total Protein: 8.3 g/dL — ABNORMAL HIGH (ref 6.5–8.1)

## 2023-10-21 LAB — T4: T4, Total: 8.5 ug/dL (ref 4.5–12.0)

## 2023-10-25 ENCOUNTER — Encounter (HOSPITAL_BASED_OUTPATIENT_CLINIC_OR_DEPARTMENT_OTHER): Payer: Self-pay

## 2023-11-20 ENCOUNTER — Telehealth (HOSPITAL_BASED_OUTPATIENT_CLINIC_OR_DEPARTMENT_OTHER): Payer: Self-pay | Admitting: Pulmonary Disease

## 2023-11-20 NOTE — Telephone Encounter (Signed)
 Patient called in prior to next Monday appointment for consult with JE. Patient wanted to know if we had received images from her recent CT scan in August from [facility]. Advised patient we do not. Patient will be in contact with her PCP. Patient was given our fax number, also patient will visit [facility] to see if she can receive a CD.  Routing just as an FYI since this is a new patient.

## 2023-11-21 NOTE — Telephone Encounter (Signed)
 Patient was able to pickup a CD of images on Wednesday. Results from [facility] have been placed in [facility] box for clinical pickup

## 2023-11-23 ENCOUNTER — Telehealth: Payer: Self-pay | Admitting: Cardiology

## 2023-11-23 DIAGNOSIS — E782 Mixed hyperlipidemia: Secondary | ICD-10-CM

## 2023-11-23 NOTE — Telephone Encounter (Signed)
Spoke with pt, aware labs have been printed for review. Her biggest question is that she would like to restart the zetia before making any changes in the statin. Please refer to my chart message.

## 2023-11-23 NOTE — Telephone Encounter (Signed)
Pt is calling to discuss her lab results. Has already sent my chart message on 10/25/23.

## 2023-11-24 ENCOUNTER — Other Ambulatory Visit: Payer: Self-pay

## 2023-11-24 NOTE — Telephone Encounter (Signed)
Awaiting MD recommendations.

## 2023-11-27 ENCOUNTER — Other Ambulatory Visit: Payer: Self-pay

## 2023-11-27 ENCOUNTER — Encounter (HOSPITAL_BASED_OUTPATIENT_CLINIC_OR_DEPARTMENT_OTHER): Payer: Self-pay | Admitting: Pulmonary Disease

## 2023-11-27 ENCOUNTER — Ambulatory Visit (HOSPITAL_BASED_OUTPATIENT_CLINIC_OR_DEPARTMENT_OTHER): Payer: Medicare Other | Admitting: Pulmonary Disease

## 2023-11-27 VITALS — BP 158/82 | HR 66 | Ht 63.0 in | Wt 131.0 lb

## 2023-11-27 DIAGNOSIS — E7849 Other hyperlipidemia: Secondary | ICD-10-CM

## 2023-11-27 DIAGNOSIS — R911 Solitary pulmonary nodule: Secondary | ICD-10-CM

## 2023-11-27 DIAGNOSIS — C8408 Mycosis fungoides, lymph nodes of multiple sites: Secondary | ICD-10-CM

## 2023-11-27 MED ORDER — EZETIMIBE 10 MG PO TABS: 10.0000 mg | ORAL_TABLET | Freq: Every day | ORAL | 1 refills | Status: DC

## 2023-11-27 NOTE — Progress Notes (Unsigned)
Subjective:   PATIENT ID: Alexa [name] GENDER: female DOB: Jul 09, 1951, MRN: [id number]  Chief Complaint  Patient presents with   Consult    Small lung nodule    Reason for Visit: New consult for pulmonary nodule  Alexa [name] is a 73 year old female never smoker with cutaneous T cell lymphoma ([facility]), hx colorectal in remission, HTN, HLD, osteopenia  She was diagnosed with colorectal cancer in 2018 with last colonoscopy in June 2022 which was negative. 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 Stopped methotrexate and IFN when blood tumor burden increased and was started on 05/25/20-09/14/20 with Brentuximab for 6 cycles however disease progreassed so started moguamulizumab and high dose prednisone 11/09/20 to 03/2022 but had bad reaction after over a year of this. Pegasys (IFN) were started. Currently on Targretin daily since 11/2022 and Pegasys weekly.  Overall no significant respiratory issues except for occasional cough from drier environments. No wheezing or shortness of breath. She works out three times a week with intense pilates, able to walk 2 miles every once in awhile.   She had abdominal pain and had CT scan on 07/04/23 that incidentally demonstrated RML scarring scarring or atelectasis and 3 mm RUL lung nodule.   Social History: Never smoker  Environmental exposures: Chemotherapy  I have personally reviewed patient's past medical/family/social history, allergies, current medications.  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 @ [facility]   Family history of anesthesia complication    sister and sister's children postitive for MH!!!!!!, pt tested negative 3-34yrs ago   GERD (gastroesophageal reflux disease)    History of  hypertension    off medication after weight loss   Hyperlipemia    Lymphoma (HCC)    light box therapy   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HCC)    Osteopenia    bilateral hips   Rectal cancer (HCC) 04/2017     Family History  Problem Relation Age of Onset   Heart attack Mother    Heart disease Mother    Kidney disease Mother    Kidney cancer Mother        contained tumor   Hypertension Mother    Anuerysm Father    Heart disease Father    Diabetes Father    Cancer Father        unknown type   Hypertension Father    Hypertension Sister    Heart attack Brother    Cancer Brother        liver cancer   Melanoma Brother    Other Brother        Bypass surgery    Ovarian cancer Paternal Grandmother        was PMP   Heart attack Nephew    Colon cancer Neg Hx      Social History   Occupational History   Not on file  Tobacco Use   Smoking status: Never   Smokeless tobacco: Never  Vaping Use   Vaping status: Never Used  Substance and Sexual Activity   Alcohol use: Not Currently    Alcohol/week: 0.0 - 1.0 standard drinks of alcohol   Drug use: No   Sexual activity: Not Currently    Partners: Male    Birth control/protection: Post-menopausal    Allergies  Allergen  Reactions   Ciprofloxacin Other (See Comments)    Gluteus medius tear  Gluteus medius tear    Muscle tear   Diphenhydramine Hcl     Other reaction(s): Other (See Comments) Pt becomes extremely hyper; pt requested premed for Mogamulizumab per NP   Pseudoephedrine Hcl Palpitations   Sudafed [Pseudoephedrine Hcl] Palpitations     Outpatient Medications Prior to Visit  Medication Sig Dispense Refill   amLODipine (NORVASC) 5 MG tablet Take 5 mg by mouth daily.     B Complex Vitamins (VITAMIN B COMPLEX) TABS Take by mouth. 5000 UT     bexarotene (TARGRETIN) 75 MG CAPS capsule Take 75 mg by mouth daily.     cetirizine (ZYRTEC) 10 MG tablet Take 10 mg by mouth daily.     Cholecalciferol (VITAMIN D) 125 MCG (5000 UT)  CAPS      Cranberry 1000 MG CAPS Take by mouth.     famotidine (PEPCID) 20 MG tablet Take 20 mg by mouth as needed.     Menaquinone-7 (K2) 90 MCG/0.5ML LIQD Take 180 mcg by mouth daily.     metoprolol tartrate (LOPRESSOR) 25 MG tablet Take 0.5 tablets (12.5 mg total) by mouth daily.     Omega-3 Fatty Acids (FISH OIL) 500 MG CAPS Take 1,500 mg by mouth.     PEGASYS 180 MCG/ML injection Inject 180 mcg into the skin every 7 (seven) days.     pravastatin (PRAVACHOL) 20 MG tablet Take 0.5 tablets (10 mg total) by mouth daily. 45 tablet 3   Probiotic Product (PROBIOTIC DAILY PO) Take by mouth.     tacrolimus (PROTOPIC) 0.1 % ointment      triamcinolone cream (KENALOG) 0.1 %      amLODipine (NORVASC) 5 MG tablet Take 1 tablet (5 mg total) by mouth daily. 90 tablet 3   No facility-administered medications prior to visit.    ROS   Objective:   Vitals:   11/27/23 1528  BP: (!) 158/82  Pulse: 66  SpO2: 100%  Weight: 131 lb (59.4 kg)  Height: 5\' 3"  (1.6 m)   SpO2: 100 %  Physical Exam: General: Well-appearing, no acute distress HENT: [place], AT Eyes: EOMI, no scleral icterus Respiratory: Clear to auscultation bilaterally.  No crackles, wheezing or rales Cardiovascular: RRR, -M/R/G, no JVD Extremities:-Edema,-tenderness Neuro: AAO x4, CNII-XII grossly intact Psych: Normal mood, normal affect  Data Reviewed:  Imaging: CT CAP 07/04/23 - Lung and pleura: There is mild right middle lobe scarring or atelectasis. There is a 3 mm right upper lobe nodule on image 146 of series 5. The lungs are otherwise clear. The central airways are patent. There is no pleural effusion or pneumothorax.   PFT: None of file  Labs:    Latest Ref Rng & Units 10/20/2023    8:38 AM 08/11/2023    9:05 AM 06/19/2023    3:03 PM  CBC  WBC 4.0 - 10.5 K/uL 2.2  2.2  2.6   Hemoglobin 12.0 - 15.0 g/dL 29.5  62.1  30.8   Hematocrit 36.0 - 46.0 % 36.4  36.6  34.3   Platelets 150 - 400 K/uL 169  155  157        Latest Ref Rng & Units 10/20/2023    8:57 AM 08/11/2023    9:05 AM 06/19/2023    3:03 PM  BMP  Glucose 70 - 99 mg/dL 93  94  83   BUN 8 - 23 mg/dL 16  19  15    Creatinine  0.44 - 1.00 mg/dL 4.69  6.29  5.28   Sodium 135 - 145 mmol/L 138  138  140   Potassium 3.5 - 5.1 mmol/L 3.9  4.5  4.3   Chloride 98 - 111 mmol/L 103  104  104   CO2 22 - 32 mmol/L 29  27  30    Calcium 8.9 - 10.3 mg/dL 9.2  9.5  9.3       Latest Ref Rng & Units 10/20/2023    8:57 AM 08/11/2023    9:05 AM 06/19/2023    3:03 PM  Hepatic Function  Total Protein 6.5 - 8.1 g/dL 8.3  8.1  7.8   Albumin 3.5 - 5.0 g/dL 4.3  4.3  4.1   AST 15 - 41 U/L 22  20  20    ALT 0 - 44 U/L 12  10  10    Alk Phosphatase 38 - 126 U/L 62  59  65   Total Bilirubin <1.2 mg/dL 0.5  0.5  0.4         Assessment & Plan:   Discussion: HPI  RUL lung nodule 3 mm - likely benign based on size Mild RML scarring/atelectasis  --I personally reviewed imaging from [facility] 07/04/23. Findings noted above were not present on prior imaging --Follow-up in 1 year (August 2025) per Fleischner's guidelines  Health Maintenance Immunization History  Administered Date(s) Administered   PFIZER Comirnaty(Gray Top)Covid-19 Tri-Sucrose Vaccine 12/13/2019, 01/10/2020, 10/19/2020   PFIZER(Purple Top)SARS-COV-2 Vaccination 12/13/2019, 01/10/2020, 10/19/2020   Pfizer Covid-19 Vaccine Bivalent Booster 7yrs & up 09/10/2021   Pneumococcal Polysaccharide-23 08/19/2019   Zoster Recombinant(Shingrix) 11/12/2019   CT Lung Screen - never smoker. Not qualified  Orders Placed This Encounter  Procedures   CT Chest Wo Contrast    Standing Status:   Future    Expiration Date:   11/26/2024    Scheduling Instructions:     Schedule in August 2025.    Preferred imaging location?:   [facility]  No orders of the defined types were placed in this encounter.   Return for after CT scan, August.  I have spent a total time of 60-minutes on the day of the  appointment reviewing prior documentation, coordinating care and discussing medical diagnosis and plan with the patient/family. Imaging, labs and tests included in this note have been reviewed and interpreted independently by me.  Adisson Deak Mechele Collin, MD [facility] 11/27/2023 4:25 PM

## 2023-11-27 NOTE — Patient Instructions (Signed)
RUL lung nodule 3 mm - likely benign based on size Mild RML scarring/atelectasis  --I personally reviewed imaging from [facility] 07/04/23. Findings noted above were not present on prior imaging --Follow-up in 1 year (August 2025) per Fleischner's guidelines

## 2023-11-27 NOTE — Telephone Encounter (Signed)
Called and dicussed NP's OK to add Zetia back to med list. Refill and lab order sent in. Pt verbalized understanding.

## 2023-11-27 NOTE — Telephone Encounter (Signed)
If she prefers okay to add Zetia 10mg  daily and continue her current dose Pravastatin. Recommend repeat FLP in 2-3 mos.   Alver Sorrow, NP

## 2023-11-28 ENCOUNTER — Telehealth: Payer: Self-pay | Admitting: Pulmonary Disease

## 2023-11-28 ENCOUNTER — Inpatient Hospital Stay
Admission: RE | Admit: 2023-11-28 | Discharge: 2023-11-28 | Disposition: A | Discharge status: Home / Self Care | Payer: Self-pay | Source: Ambulatory Visit | Attending: Pulmonary Disease | Admitting: Pulmonary Disease

## 2023-11-28 DIAGNOSIS — R911 Solitary pulmonary nodule: Secondary | ICD-10-CM

## 2023-11-28 NOTE — Telephone Encounter (Signed)
Triage disregard. CD is being sent to [facility]. Patient will pickup the CD once canopy sends it back

## 2023-11-28 NOTE — Addendum Note (Signed)
Addended by: Judd Gaudier on: 11/28/2023 09:59 AM   Modules accepted: Orders

## 2023-11-28 NOTE — Telephone Encounter (Signed)
PT had appt yesterday. Feels as though she left her CD at the office. Can we check and call her to come pick it up? Please call her @ [phone]

## 2023-11-29 NOTE — Telephone Encounter (Signed)
NFN 

## 2023-11-30 ENCOUNTER — Other Ambulatory Visit (HOSPITAL_BASED_OUTPATIENT_CLINIC_OR_DEPARTMENT_OTHER): Payer: Self-pay

## 2023-11-30 ENCOUNTER — Encounter (HOSPITAL_COMMUNITY): Payer: Self-pay

## 2023-11-30 ENCOUNTER — Encounter (HOSPITAL_BASED_OUTPATIENT_CLINIC_OR_DEPARTMENT_OTHER): Payer: Self-pay

## 2023-11-30 ENCOUNTER — Ambulatory Visit (HOSPITAL_COMMUNITY)
Admission: EM | Admit: 2023-11-30 | Discharge: 2023-11-30 | Disposition: A | Discharge status: Home / Self Care | Payer: Medicare Other | Attending: Nurse Practitioner | Admitting: Nurse Practitioner

## 2023-11-30 ENCOUNTER — Telehealth (HOSPITAL_BASED_OUTPATIENT_CLINIC_OR_DEPARTMENT_OTHER): Payer: Self-pay

## 2023-11-30 DIAGNOSIS — I10 Essential (primary) hypertension: Secondary | ICD-10-CM | POA: Diagnosis not present

## 2023-11-30 LAB — POCT URINALYSIS DIP (MANUAL ENTRY)
Bilirubin, UA: NEGATIVE
Glucose, UA: NEGATIVE mg/dL
Ketones, POC UA: NEGATIVE mg/dL
Leukocytes, UA: NEGATIVE
Nitrite, UA: NEGATIVE
Protein Ur, POC: NEGATIVE mg/dL
Spec Grav, UA: 1.01
Urobilinogen, UA: 0.2 U/dL
pH, UA: 5.5

## 2023-11-30 NOTE — Discharge Instructions (Signed)
Your EKG was normal sinus rhythm.  The overall exam was normal.  Cardiology has been made aware of slight elevation in blood pressure and the increase in the amlodipine.  They will be following up with the for next steps and further evaluation.  Your urinalysis was negative for any bacteria there was a trace of red blood cells.  The recommendation is continue to hydrate well with water I would encourage the use of 100% cranberry juice 8 ounces daily over the next week.  If symptoms persist or get worse follow-up with your primary care provider. For your shoulder and neck pain I would encourage Tylenol and or Salonpas patches daily

## 2023-11-30 NOTE — Telephone Encounter (Signed)
Alerted by Thad Ranger, NP through secure chat pt arrived to urgent care for high BP readings. The following message was sent:   Good afternoon, just wanted to make you aware that she is in urgent care today for elevated blood pressure. She has increased her amlodipine to 10 mg. Blood pressures in the 150s over 70s to 80s. She was complaining of some chest discomfort EKG she normal sinus rhythm. She endorses that is difficult to get in with your office as sometimes. This is just an FYI thanks   Attempted to call pt to schedule OV with Dr. Cristal Deer; left VM to call back.

## 2023-11-30 NOTE — ED Provider Notes (Signed)
[facility]    CSN: [id number] Arrival date & time: 11/30/23  1324      History   Chief Complaint Chief Complaint  Patient presents with   Torticollis   Abdominal Pain    HPI Alexa [name] is a 73 y.o. female.   HPI  She is in today for evaluation of multiple symptoms.  She reports that she started with some burning type pain to her left lower abdomen.  She endorses having loose stools.  She endorses dry mouth, She denies diarrhea, nausea or vomiting.  She denies fever, chills.  She also reports that she came in mainly because of her blood pressure.  She noticed that her blood pressure was 160's.  She reported her normal blood pressure ranges 130s to 140s over 70s.  She is followed by cardiology.   She did endorse taking amlodipine 5 mg 2 tablets this morning current blood pressure is 159/83.  She is having some bilateral left shoulder pain.  She reports that she does have a right rotator cuff tear which she has been treating with physical therapy over the last several months and he has improved.  She does have some pain is going into the left forearm.  This concerns her.  She reports that her last EKG was approximately 6 months ago.  She was recently seen by pulmonology for a left lung nodule which is benign.  She reports that it does concern her however the pulmonologist was not concerned.  She denies any recent stressors.  She reports that overall her diet has been normal except for the last Saturday she may have been low definitely.  Reports being very intentional with a with her diet on Sunday, Monday and is back to her normal diet today. 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 @ [facility]   Family history of anesthesia complication    sister and sister's children postitive for MH!!!!!!, pt tested negative 3-59yrs ago   GERD (gastroesophageal reflux disease)    History of  hypertension    off medication after weight loss   Hyperlipemia    Lymphoma (HCC)    light box therapy   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HCC)    Osteopenia    bilateral hips   Rectal cancer (HCC) 04/2017    Patient Active Problem List   Diagnosis Date Noted   Coronary atherosclerosis due to calcified coronary lesion 11/27/2022   Muscle strain of gluteal region, right, sequela 04/08/2021   Counseling regarding advance care planning and goals of care 12/04/2020   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   Situational anxiety 08/13/2019   Hypertension, essential 02/06/2018   Other hyperlipidemia 02/06/2018   Colorectal cancer, stage I (HCC) 09/19/2017   Laryngopharyngeal reflux (LPR) 12/23/2016   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HCC) 08/11/2016   Sciatic nerve pain 07/28/2016   Achilles tendinitis of right lower extremity 06/10/2015   Dysphagia 05/09/2012   Palpitations 09/26/2011    Past Surgical History:  Procedure Laterality Date   BREAST BIOPSY     COLONOSCOPY  03/07/2012   with biopsy   DILATATION & CURRETTAGE/HYSTEROSCOPY WITH RESECTOCOPE N/A 06/02/2014   Procedure: DILATATION & CURETTAGE/HYSTEROSCOPY WITH RESECTOCOPE;  Surgeon: Annamaria Boots, MD;  Location: [facility];  Service: Gynecology;  Laterality: N/A;   FOOT SURGERY     Right    TRANSANAL EXCISION OF RECTAL MASS N/A 06/09/2017   Procedure: TRANSANAL EXCISION OF RECTAL  MASS;  Surgeon: Romie Levee, MD;  Location: [facility];  Service: General;  Laterality: N/A;    OB History     Gravida  1   Para  0   Term      Preterm      AB      Living  0      SAB      IAB      Ectopic      Multiple      Live Births               Home Medications    Prior to Admission medications   Medication Sig Start Date End Date Taking? Authorizing Provider  amLODipine (NORVASC) 5 MG tablet Take 5 mg by mouth daily.    [provider]  B Complex Vitamins (VITAMIN B COMPLEX) TABS Take by mouth. 5000 UT     [provider]  bexarotene (TARGRETIN) 75 MG CAPS capsule Take 75 mg by mouth daily.    [provider]  cetirizine (ZYRTEC) 10 MG tablet Take 10 mg by mouth daily.    [provider]  Cholecalciferol (VITAMIN D) 125 MCG (5000 UT) CAPS     [provider]  Cranberry 1000 MG CAPS Take by mouth.    [provider]  ezetimibe (ZETIA) 10 MG tablet Take 1 tablet (10 mg total) by mouth daily. 11/27/23 02/25/24  Alver Sorrow, NP  famotidine (PEPCID) 20 MG tablet Take 20 mg by mouth as needed.    [provider]  Menaquinone-7 (K2) 90 MCG/0.5ML LIQD Take 180 mcg by mouth daily.    [provider]  metoprolol tartrate (LOPRESSOR) 25 MG tablet Take 0.5 tablets (12.5 mg total) by mouth daily. 11/26/20   Cantwell, Celeste C, PA-C  Omega-3 Fatty Acids (FISH OIL) 500 MG CAPS Take 1,500 mg by mouth.    [provider]  PEGASYS 180 MCG/ML injection Inject 180 mcg into the skin every 7 (seven) days.    [provider]  pravastatin (PRAVACHOL) 20 MG tablet Take 0.5 tablets (10 mg total) by mouth daily. 09/21/23   Jodelle Red, MD  Probiotic Product (PROBIOTIC DAILY PO) Take by mouth.    [provider]  tacrolimus (PROTOPIC) 0.1 % ointment  07/07/21   [provider]  triamcinolone cream (KENALOG) 0.1 %  05/08/20   [provider]    Family History Family History  Problem Relation Age of Onset   Heart attack Mother    Heart disease Mother    Kidney disease Mother    Kidney cancer Mother        contained tumor   Hypertension Mother    Anuerysm Father    Heart disease Father    Diabetes Father    Cancer Father        unknown type   Hypertension Father    Hypertension Sister    Heart attack Brother    Cancer Brother        liver cancer   Melanoma Brother    Other Brother        Bypass surgery    Ovarian cancer Paternal Grandmother        was PMP   Heart attack Nephew    Colon  cancer Neg Hx     Social History Social History   Tobacco Use   Smoking status: Never   Smokeless tobacco: Never  Vaping Use   Vaping status: Never Used  Substance Use Topics   Alcohol use: Not Currently    Alcohol/week: 0.0 - 1.0 standard drinks of alcohol   Drug use: No     Allergies   Ciprofloxacin, Pseudoephedrine hcl, and Sudafed [pseudoephedrine hcl]   Review of Systems Review of Systems   Physical Exam Triage Vital Signs ED Triage Vitals  Encounter Vitals Group     BP 11/30/23 1448 (!) 159/83     Systolic BP Percentile --      Diastolic BP Percentile --      Pulse Rate 11/30/23 1448 74     Resp 11/30/23 1448 16     Temp 11/30/23 1448 98.1 F (36.7 C)     Temp Source 11/30/23 1448 Oral     SpO2 11/30/23 1448 96 %     Weight 11/30/23 1448 130 lb (59 kg)     Height 11/30/23 1448 5\' 3"  (1.6 m)     Head Circumference --      Peak Flow --      Pain Score 11/30/23 1454 4     Pain Loc --      Pain Education --      Exclude from Growth Chart --    No data found.  Updated Vital Signs BP (!) 157/81 (BP Location: Left Arm)   Pulse 74   Temp 98.1 F (36.7 C) (Oral)   Resp 16   Ht 5\' 3"  (1.6 m)   Wt 130 lb (59 kg)   LMP 11/07/2002   SpO2 96%   BMI 23.03 kg/m   Visual Acuity Right Eye Distance:   Left Eye Distance:   Bilateral Distance:    Right Eye Near:   Left Eye Near:    Bilateral Near:     Physical Exam Constitutional:      General: She is not in acute distress.    Appearance: She is normal weight. She is not ill-appearing, toxic-appearing or diaphoretic.  HENT:     Head: Normocephalic and atraumatic.  Eyes:     Pupils: Pupils are equal, round, and reactive to light.  Cardiovascular:     Rate and Rhythm: Normal rate and regular rhythm.     Pulses: Normal pulses.     Heart sounds: Normal heart sounds.  Pulmonary:     Effort: Pulmonary effort is normal.     Breath sounds: Normal breath sounds.  Abdominal:     General: Bowel sounds are  normal. There is no distension.     Palpations: Abdomen is soft. There is no mass.     Tenderness: There is no abdominal tenderness. There is no right CVA tenderness, left CVA tenderness, guarding or rebound.     Hernia: No hernia is present.  Musculoskeletal:        General: Normal range of motion.     Cervical back: Normal range of motion.  Skin:    General: Skin is warm and dry.     Capillary Refill: Capillary refill takes less than 2 seconds.  Neurological:     General: No focal deficit present.     Mental Status: She is alert and oriented to person, place, and time.      UC Treatments / Results  Labs (all labs ordered are listed, but only abnormal results are displayed) Labs Reviewed  POCT URINALYSIS DIP (MANUAL ENTRY) - Abnormal; Notable for the following components:      Result Value   Blood, UA trace-intact (*)    All other components within normal limits  EKG   Radiology No results found.  Procedures Procedures (including critical care time)  Medications Ordered in UC Medications - No data to display  Initial Impression / Assessment and Plan / UC Course  I have reviewed the triage vital signs and the nursing notes.  Pertinent labs & imaging results that were available during my care of the patient were reviewed by me and considered in my medical decision making (see chart for details).     Multiple symptoms Final Clinical Impressions(s) / UC Diagnoses   Final diagnoses:  Essential hypertension     Discharge Instructions      Your EKG was normal sinus rhythm.  The overall exam was normal.  Cardiology has been made aware of slight elevation in blood pressure and the increase in the amlodipine.  They will be following up with the for next steps and further evaluation.  Your urinalysis was negative for any bacteria there was a trace of red blood cells.  The recommendation is continue to hydrate well with water I would encourage the use of 100% cranberry  juice 8 ounces daily over the next week.  If symptoms persist or get worse follow-up with your primary care provider. For your shoulder and neck pain I would encourage Tylenol and or Salonpas patches daily     ED Prescriptions   None    PDMP not reviewed this encounter.   Thad Ranger [place], [place] 11/30/23 [phone]

## 2023-11-30 NOTE — ED Triage Notes (Signed)
Patient here today with c/o soreness in her neck radiating down to upper back, both shoulders, and left arm on and off for 7-8 days. Heat helps.   Patient has also noticed that she has been having a burning sensation in her lower abd X 1 day.   Patient also takes BP medication and noticed that her blood pressure has been slightly elevated since Monday.

## 2023-11-30 NOTE — Telephone Encounter (Signed)
Pt returned call. Discussed visit to Urgent Care. No med changes done per Urgent Care.  Monday BP: 158/80s Wednesday BP: 165/92 Thurs BP: 165/82 All BPs are before taking medications.  Pt stated she's been having neck, shoulder, back and chest pain and some episodes of loose stool. APP recommended increasing Amlodipine to 10 mg (she is to take two 5mg  tablets). Refills sent to local pharmacy.  Recommended pt take BP 1-2 hours after taking medications and to be at rest for 5-10 mins before taking BP. Record each reading and bring to her next OV with Dr. Cristal Deer. Scheduled pt on 2/3 @ 10:00.   Pt verbalized understanding and was thankful for call.

## 2023-12-01 ENCOUNTER — Encounter: Payer: Self-pay | Admitting: Hematology

## 2023-12-01 ENCOUNTER — Inpatient Hospital Stay: Payer: Medicare Other | Attending: Hematology

## 2023-12-01 ENCOUNTER — Other Ambulatory Visit: Payer: Self-pay

## 2023-12-01 DIAGNOSIS — Z79899 Other long term (current) drug therapy: Secondary | ICD-10-CM | POA: Insufficient documentation

## 2023-12-01 DIAGNOSIS — C8408 Mycosis fungoides, lymph nodes of multiple sites: Secondary | ICD-10-CM | POA: Diagnosis present

## 2023-12-01 DIAGNOSIS — E7849 Other hyperlipidemia: Secondary | ICD-10-CM

## 2023-12-01 LAB — CBC WITH DIFFERENTIAL (CANCER CENTER ONLY)
Abs Immature Granulocytes: 0.01 10*3/uL (ref 0.00–0.07)
Basophils Absolute: 0 10*3/uL (ref 0.0–0.1)
Basophils Relative: 1 %
Eosinophils Absolute: 0 10*3/uL (ref 0.0–0.5)
Eosinophils Relative: 1 %
HCT: 35.1 % — ABNORMAL LOW (ref 36.0–46.0)
Hemoglobin: 12 g/dL (ref 12.0–15.0)
Immature Granulocytes: 1 %
Lymphocytes Relative: 24 %
Lymphs Abs: 0.5 10*3/uL — ABNORMAL LOW (ref 0.7–4.0)
MCH: 32.6 pg (ref 26.0–34.0)
MCHC: 34.2 g/dL (ref 30.0–36.0)
MCV: 95.4 fL (ref 80.0–100.0)
Monocytes Absolute: 0.3 10*3/uL (ref 0.1–1.0)
Monocytes Relative: 12 %
Neutro Abs: 1.3 10*3/uL — ABNORMAL LOW (ref 1.7–7.7)
Neutrophils Relative %: 61 %
Platelet Count: 171 10*3/uL (ref 150–400)
RBC: 3.68 MIL/uL — ABNORMAL LOW (ref 3.87–5.11)
RDW: 11.9 % (ref 11.5–15.5)
WBC Count: 2.1 10*3/uL — ABNORMAL LOW (ref 4.0–10.5)
nRBC: 0 % (ref 0.0–0.2)

## 2023-12-01 LAB — CMP (CANCER CENTER ONLY)
ALT: 8 U/L (ref 0–44)
AST: 19 U/L (ref 15–41)
Albumin: 4.2 g/dL (ref 3.5–5.0)
Alkaline Phosphatase: 61 U/L (ref 38–126)
Anion gap: 6 (ref 5–15)
BUN: 17 mg/dL (ref 8–23)
CO2: 29 mmol/L (ref 22–32)
Calcium: 9.3 mg/dL (ref 8.9–10.3)
Chloride: 103 mmol/L (ref 98–111)
Creatinine: 0.84 mg/dL (ref 0.44–1.00)
GFR, Estimated: >60 mL/min (ref >60)
Glucose, Bld: 89 mg/dL (ref 70–99)
Potassium: 3.8 mmol/L (ref 3.5–5.1)
Sodium: 138 mmol/L (ref 135–145)
Total Bilirubin: 0.4 mg/dL (ref 0.0–1.2)
Total Protein: 8.1 g/dL (ref 6.5–8.1)

## 2023-12-01 LAB — LIPID PANEL
Cholesterol: 228 mg/dL — ABNORMAL HIGH (ref 0–200)
HDL: 52 mg/dL (ref >40)
LDL Cholesterol: 155 mg/dL — ABNORMAL HIGH (ref 0–99)
Total CHOL/HDL Ratio: 4.4 {ratio}
Triglycerides: 104 mg/dL (ref <150)
VLDL: 21 mg/dL (ref 0–40)

## 2023-12-01 MED ORDER — AMLODIPINE BESYLATE 5 MG PO TABS: 5.0000 mg | ORAL_TABLET | Freq: Every day | ORAL | 11 refills | Status: DC

## 2023-12-02 ENCOUNTER — Encounter (HOSPITAL_BASED_OUTPATIENT_CLINIC_OR_DEPARTMENT_OTHER): Payer: Self-pay

## 2023-12-02 LAB — T4: T4, Total: 8.3 ug/dL (ref 4.5–12.0)

## 2023-12-04 NOTE — Progress Notes (Incomplete)
  Cardiology Office Note:  .   Date:  12/04/2023  ID:  Alexa [name], DOB June 01, 1951, MRN [id number] PCP: Alexa Daniel, PA-C  [facility] Providers Cardiologist:  Jodelle Red, MD {  History of Present Illness: .   Alexa [name] is a 73 y.o. female with PMH hypertension, hyperlipidemia, palpitations, T cell lymphoma and colorectal cancer.   Pertinent CV history:Calcium score in 2022 was 34.2, which was 63rd percentile. She had declined statins, has tolerated ezetimibe.  Today: Previously seen by Dr. Jacinto Halim and then Dr. Odis Hollingshead. Wishes to be followed at [facility] and transferring care today.  Family history: mother, father, and two brothers have HTN. Two sisters have mildly elevated BP. Mother died age 49 of MI, had not had known CAD prior. Father in his 3s had MI, then had CHF later in life. Died from an aneurysm. Oldest brother had MI in his early 42s. Youngest brother had 4V CABG in his early 69s.   We reviewed her prior workups. She prefers to try to manage with diet (doing high fiber now). Tried a statin around 2016 and had joint aches.  We discussed calcium score and guidelines at length today. Recent lipids reviewed, Tchol 213, HDL 48, LDL 137, TG 142 in 08/2023. Has upcoming bloodwork including cholesterol.  She exercises, eats well.   Today: Seen for close follow up after ER visit 11/30/23. Presented with abdominal pain and elevated blood pressure.  ROS: Denies chest pain, shortness of breath at rest or with normal exertion. No PND, orthopnea, LE edema or unexpected weight gain. No syncope or palpitations. ROS otherwise negative except as noted.   Studies Reviewed: Marland Kitchen    EKG:       Physical Exam:   VS:  LMP 11/07/2002    Wt Readings from Last 3 Encounters:  11/30/23 130 lb (59 kg)  11/27/23 131 lb (59.4 kg)  09/21/23 131 lb 9.6 oz (59.7 kg)    GEN: Well nourished, well developed in no acute distress HEENT: Normal, moist mucous  membranes NECK: No JVD CARDIAC: regular rhythm, normal S1 and S2, no rubs or gallops. No murmur. VASCULAR: Radial and DP pulses 2+ bilaterally. No carotid bruits RESPIRATORY:  Clear to auscultation without rales, wheezing or rhonchi  ABDOMEN: Soft, non-tender, non-distended MUSCULOSKELETAL:  Ambulates independently SKIN: Warm and dry, no edema NEUROLOGIC:  Alert and oriented x 3. No focal neuro deficits noted. PSYCHIATRIC:  Normal affect    ASSESSMENT AND PLAN: .    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 We discussed options today at length. After shared decision making, she is open to trying low dose statin and uptitrating as tolerated -stop zetia -start pravastatin 10 mg once a week. Increase dose/frequency as tolerated  CV risk counseling and prevention -recommend heart healthy/Mediterranean diet, with whole grains, fruits, vegetable, fish, lean meats, nuts, and olive oil. Limit salt. -recommend moderate walking, 3-5 times/week for 30-50 minutes each session. Aim for at least 150 minutes.week. Goal should be pace of 3 miles/hours, or walking 1.5 miles in 30 minutes -recommend avoidance of tobacco products. Avoid excess alcohol.  Dispo: 6 mos or sooner as needed  Signed, Jodelle Red, MD   Jodelle Red, MD, PhD, [facility] [place]  [facility]  [place]  Heart & Vascular at [facility] at [facility] [address] [place], [place] [address] [phone]

## 2023-12-05 ENCOUNTER — Ambulatory Visit (INDEPENDENT_AMBULATORY_CARE_PROVIDER_SITE_OTHER): Payer: Medicare Other | Admitting: Cardiology

## 2023-12-05 VITALS — BP 142/84 | HR 60 | Ht 63.0 in | Wt 131.2 lb

## 2023-12-05 DIAGNOSIS — Z79899 Other long term (current) drug therapy: Secondary | ICD-10-CM | POA: Diagnosis not present

## 2023-12-05 DIAGNOSIS — I2510 Atherosclerotic heart disease of native coronary artery without angina pectoris: Secondary | ICD-10-CM

## 2023-12-05 DIAGNOSIS — T466X5D Adverse effect of antihyperlipidemic and antiarteriosclerotic drugs, subsequent encounter: Secondary | ICD-10-CM

## 2023-12-05 DIAGNOSIS — E7800 Pure hypercholesterolemia, unspecified: Secondary | ICD-10-CM

## 2023-12-05 DIAGNOSIS — I10 Essential (primary) hypertension: Secondary | ICD-10-CM | POA: Diagnosis not present

## 2023-12-05 DIAGNOSIS — M791 Myalgia, unspecified site: Secondary | ICD-10-CM

## 2023-12-05 MED ORDER — METOPROLOL SUCCINATE ER 25 MG PO TB24: 12.5000 mg | ORAL_TABLET | Freq: Every day | ORAL | 3 refills | Status: DC

## 2023-12-05 NOTE — Patient Instructions (Addendum)
Medication Instructions:  Your physician has recommended you make the following change in your medication:   First step: switching metoprolol tartrate (short acting) to metoprolol succinate (long acting) at the same 12.5 mg daily dose to try to prevent spikes  Next step: if blood pressure is consistently more than 130/80 after this change, then we can change the amlodipine - if 130s systolic, I would try amlodipine 7.5 mg daily - if 140s or more systolic, I would change to amlodipine 10 mg daily  Check blood pressure 1-2 hours after taking the medication to be consistent. Also see if there are any other factors (pain, headache, etc) going on at the time you are checking your blood pressure to see if something else is contributing.  In terms of cholesterol: We are going to gradually adjust your pravastatin to see what your maximum tolerated dose is, and then once you are on this stable dose for at least 6 weeks, we will recheck your cholesterol. I would stop the zetia during this time so we can find your baseline - starting point: 5 mg pravastatin once a week - increase by adding one more day per week (5 mg twice a week, then 5 mg three times a week, etc) - if you tolerate 5 mg every day, then start adding a 10 mg dose, increasing once per week (ex: 5 mg six days a week/10 mg one day a week, then 5 mgs 5 days a week/10 mg two days a week, etc). - if we get to a dose that you have significant side effects, then back down to the last tolerated dose   Follow-Up: At [facility], you and your health needs are our priority.  As part of our continuing mission to provide you with exceptional heart care, we have created designated Provider Care Teams.  These Care Teams include your primary Cardiologist (physician) and Advanced Practice Providers (APPs -  Physician Assistants and Nurse Practitioners) who all work together to provide you with the care you need, when you need it.  We recommend  signing up for the patient portal called "MyChart".  Sign up information is provided on this After Visit Summary.  MyChart is used to connect with patients for Virtual Visits (Telemedicine).  Patients are able to view lab/test results, encounter notes, upcoming appointments, etc.  Non-urgent messages can be sent to your provider as well.   To learn more about what you can do with MyChart, go to [email or web address].    Your next appointment:   As scheduled  Provider:   Jodelle Red, MD    Other Instructions

## 2023-12-11 ENCOUNTER — Ambulatory Visit (INDEPENDENT_AMBULATORY_CARE_PROVIDER_SITE_OTHER): Payer: Medicare Other

## 2023-12-11 ENCOUNTER — Ambulatory Visit (HOSPITAL_BASED_OUTPATIENT_CLINIC_OR_DEPARTMENT_OTHER): Payer: Medicare Other | Admitting: Cardiology

## 2023-12-14 NOTE — Telephone Encounter (Signed)
 Disc is ready for pick at the front [facility]. Patient has been left a Engineer, technical sales

## 2023-12-17 ENCOUNTER — Encounter (HOSPITAL_BASED_OUTPATIENT_CLINIC_OR_DEPARTMENT_OTHER): Payer: Self-pay | Admitting: Cardiology

## 2024-01-15 ENCOUNTER — Telehealth (HOSPITAL_BASED_OUTPATIENT_CLINIC_OR_DEPARTMENT_OTHER): Payer: Self-pay | Admitting: Cardiology

## 2024-01-15 MED ORDER — AMLODIPINE BESYLATE 5 MG PO TABS: 7.5000 mg | ORAL_TABLET | Freq: Every day | ORAL | 3 refills | Status: DC

## 2024-01-15 NOTE — Addendum Note (Signed)
 Addended by: Marlene Lard on: 01/15/2024 01:02 PM   Modules accepted: Orders

## 2024-01-15 NOTE — Telephone Encounter (Signed)
 Pt c/o medication issue:  1. Name of Medication:   amLODipine (NORVASC) 5 MG tablet   2. How are you currently taking this medication (dosage and times per day)?   Patient stated she is completely out of this medication  3. Are you having a reaction (difficulty breathing--STAT)?   4. What is your medication issue?   Patient stated her medication was increased and she wants to confirm the dosage.  Patient stated she will need a new prescription sent to [id number] - [place], [place] - [address] AT [facility] OF [address] & [facility] [address]  as she is now completely out of this medication.

## 2024-01-22 ENCOUNTER — Encounter (HOSPITAL_BASED_OUTPATIENT_CLINIC_OR_DEPARTMENT_OTHER): Payer: Self-pay

## 2024-01-22 ENCOUNTER — Encounter (HOSPITAL_BASED_OUTPATIENT_CLINIC_OR_DEPARTMENT_OTHER): Payer: Self-pay | Admitting: Obstetrics & Gynecology

## 2024-01-24 ENCOUNTER — Other Ambulatory Visit: Payer: Self-pay

## 2024-01-24 DIAGNOSIS — E7849 Other hyperlipidemia: Secondary | ICD-10-CM

## 2024-01-24 DIAGNOSIS — C8408 Mycosis fungoides, lymph nodes of multiple sites: Secondary | ICD-10-CM

## 2024-01-26 ENCOUNTER — Inpatient Hospital Stay: Attending: Hematology

## 2024-01-26 DIAGNOSIS — C8408 Mycosis fungoides, lymph nodes of multiple sites: Secondary | ICD-10-CM | POA: Diagnosis present

## 2024-01-26 DIAGNOSIS — E7849 Other hyperlipidemia: Secondary | ICD-10-CM | POA: Diagnosis not present

## 2024-01-26 LAB — CMP (CANCER CENTER ONLY)
ALT: 11 U/L (ref 0–44)
AST: 21 U/L (ref 15–41)
Albumin: 4.3 g/dL (ref 3.5–5.0)
Alkaline Phosphatase: 66 U/L (ref 38–126)
Anion gap: 7 (ref 5–15)
BUN: 19 mg/dL (ref 8–23)
CO2: 27 mmol/L (ref 22–32)
Calcium: 9.4 mg/dL (ref 8.9–10.3)
Chloride: 104 mmol/L (ref 98–111)
Creatinine: 0.87 mg/dL (ref 0.44–1.00)
GFR, Estimated: >60 mL/min (ref >60)
Glucose, Bld: 95 mg/dL (ref 70–99)
Potassium: 4.8 mmol/L (ref 3.5–5.1)
Sodium: 138 mmol/L (ref 135–145)
Total Bilirubin: 0.4 mg/dL (ref 0.0–1.2)
Total Protein: 8.5 g/dL — ABNORMAL HIGH (ref 6.5–8.1)

## 2024-01-26 LAB — LIPID PANEL
Cholesterol: 228 mg/dL — ABNORMAL HIGH (ref 0–200)
HDL: 59 mg/dL (ref >40)
LDL Cholesterol: 152 mg/dL — ABNORMAL HIGH (ref 0–99)
Total CHOL/HDL Ratio: 3.9 ratio
Triglycerides: 86 mg/dL (ref <150)
VLDL: 17 mg/dL (ref 0–40)

## 2024-01-26 LAB — CBC WITH DIFFERENTIAL (CANCER CENTER ONLY)
Abs Immature Granulocytes: 0 10*3/uL (ref 0.00–0.07)
Basophils Absolute: 0 10*3/uL (ref 0.0–0.1)
Basophils Relative: 1 %
Eosinophils Absolute: 0 10*3/uL (ref 0.0–0.5)
Eosinophils Relative: 2 %
HCT: 35.7 % — ABNORMAL LOW (ref 36.0–46.0)
Hemoglobin: 12.5 g/dL (ref 12.0–15.0)
Immature Granulocytes: 0 %
Lymphocytes Relative: 44 %
Lymphs Abs: 0.6 10*3/uL — ABNORMAL LOW (ref 0.7–4.0)
MCH: 33.2 pg (ref 26.0–34.0)
MCHC: 35 g/dL (ref 30.0–36.0)
MCV: 94.7 fL (ref 80.0–100.0)
Monocytes Absolute: 0.3 10*3/uL (ref 0.1–1.0)
Monocytes Relative: 23 %
Neutro Abs: 0.4 10*3/uL — CL (ref 1.7–7.7)
Neutrophils Relative %: 30 %
Platelet Count: 157 10*3/uL (ref 150–400)
RBC: 3.77 MIL/uL — ABNORMAL LOW (ref 3.87–5.11)
RDW: 12.6 % (ref 11.5–15.5)
WBC Count: 1.3 10*3/uL — ABNORMAL LOW (ref 4.0–10.5)
nRBC: 0 % (ref 0.0–0.2)

## 2024-01-26 NOTE — Progress Notes (Signed)
 Pt notified of lab results today and pt stated she had already seen them. Pt has reached out to her [facility] provider to let her know lab results and is waiting for instructions on possibly holding medication.

## 2024-01-27 LAB — T4: T4, Total: 7.2 ug/dL (ref 4.5–12.0)

## 2024-02-12 ENCOUNTER — Other Ambulatory Visit: Payer: Self-pay

## 2024-02-12 DIAGNOSIS — E7849 Other hyperlipidemia: Secondary | ICD-10-CM

## 2024-02-12 DIAGNOSIS — C8408 Mycosis fungoides, lymph nodes of multiple sites: Secondary | ICD-10-CM

## 2024-02-14 ENCOUNTER — Inpatient Hospital Stay: Attending: Hematology

## 2024-02-14 DIAGNOSIS — E7849 Other hyperlipidemia: Secondary | ICD-10-CM | POA: Insufficient documentation

## 2024-02-14 DIAGNOSIS — C8408 Mycosis fungoides, lymph nodes of multiple sites: Secondary | ICD-10-CM | POA: Diagnosis present

## 2024-02-14 LAB — CBC WITH DIFFERENTIAL (CANCER CENTER ONLY)
Abs Immature Granulocytes: 0 10*3/uL (ref 0.00–0.07)
Basophils Absolute: 0 10*3/uL (ref 0.0–0.1)
Basophils Relative: 0 %
Eosinophils Absolute: 0 10*3/uL (ref 0.0–0.5)
Eosinophils Relative: 1 %
HCT: 35.9 % — ABNORMAL LOW (ref 36.0–46.0)
Hemoglobin: 12.5 g/dL (ref 12.0–15.0)
Immature Granulocytes: 0 %
Lymphocytes Relative: 26 %
Lymphs Abs: 0.6 10*3/uL — ABNORMAL LOW (ref 0.7–4.0)
MCH: 33 pg (ref 26.0–34.0)
MCHC: 34.8 g/dL (ref 30.0–36.0)
MCV: 94.7 fL (ref 80.0–100.0)
Monocytes Absolute: 0.3 10*3/uL (ref 0.1–1.0)
Monocytes Relative: 13 %
Neutro Abs: 1.4 10*3/uL — ABNORMAL LOW (ref 1.7–7.7)
Neutrophils Relative %: 60 %
Platelet Count: 158 10*3/uL (ref 150–400)
RBC: 3.79 MIL/uL — ABNORMAL LOW (ref 3.87–5.11)
RDW: 12.5 % (ref 11.5–15.5)
WBC Count: 2.4 10*3/uL — ABNORMAL LOW (ref 4.0–10.5)
nRBC: 0 % (ref 0.0–0.2)

## 2024-02-14 LAB — LIPID PANEL
Cholesterol: 216 mg/dL — ABNORMAL HIGH (ref 0–200)
HDL: 52 mg/dL (ref >40)
LDL Cholesterol: 144 mg/dL — ABNORMAL HIGH (ref 0–99)
Total CHOL/HDL Ratio: 4.2 ratio
Triglycerides: 100 mg/dL (ref <150)
VLDL: 20 mg/dL (ref 0–40)

## 2024-02-14 LAB — CMP (CANCER CENTER ONLY)
ALT: 11 U/L (ref 0–44)
AST: 18 U/L (ref 15–41)
Albumin: 4.4 g/dL (ref 3.5–5.0)
Alkaline Phosphatase: 64 U/L (ref 38–126)
Anion gap: 4 — ABNORMAL LOW (ref 5–15)
BUN: 17 mg/dL (ref 8–23)
CO2: 30 mmol/L (ref 22–32)
Calcium: 9.4 mg/dL (ref 8.9–10.3)
Chloride: 103 mmol/L (ref 98–111)
Creatinine: 0.91 mg/dL (ref 0.44–1.00)
GFR, Estimated: >60 mL/min (ref >60)
Glucose, Bld: 94 mg/dL (ref 70–99)
Potassium: 3.9 mmol/L (ref 3.5–5.1)
Sodium: 137 mmol/L (ref 135–145)
Total Bilirubin: 0.6 mg/dL (ref 0.0–1.2)
Total Protein: 8.3 g/dL — ABNORMAL HIGH (ref 6.5–8.1)

## 2024-02-15 LAB — T4: T4, Total: 9.5 ug/dL (ref 4.5–12.0)

## 2024-02-22 ENCOUNTER — Other Ambulatory Visit: Payer: Self-pay | Admitting: Gastroenterology

## 2024-02-22 DIAGNOSIS — R1310 Dysphagia, unspecified: Secondary | ICD-10-CM

## 2024-02-26 ENCOUNTER — Other Ambulatory Visit: Payer: Self-pay | Admitting: Obstetrics & Gynecology

## 2024-02-26 DIAGNOSIS — Z1231 Encounter for screening mammogram for malignant neoplasm of breast: Secondary | ICD-10-CM

## 2024-02-29 ENCOUNTER — Ambulatory Visit
Admission: RE | Admit: 2024-02-29 | Discharge: 2024-02-29 | Disposition: A | Discharge status: Home / Self Care | Source: Ambulatory Visit | Attending: Gastroenterology | Admitting: Gastroenterology

## 2024-02-29 DIAGNOSIS — R1310 Dysphagia, unspecified: Secondary | ICD-10-CM

## 2024-03-18 ENCOUNTER — Other Ambulatory Visit: Payer: Self-pay | Admitting: *Deleted

## 2024-03-18 ENCOUNTER — Encounter: Payer: Self-pay | Admitting: *Deleted

## 2024-03-18 DIAGNOSIS — E7849 Other hyperlipidemia: Secondary | ICD-10-CM

## 2024-03-18 DIAGNOSIS — C8408 Mycosis fungoides, lymph nodes of multiple sites: Secondary | ICD-10-CM

## 2024-03-20 ENCOUNTER — Inpatient Hospital Stay: Attending: Hematology

## 2024-03-20 DIAGNOSIS — Z79899 Other long term (current) drug therapy: Secondary | ICD-10-CM | POA: Diagnosis not present

## 2024-03-20 DIAGNOSIS — E7849 Other hyperlipidemia: Secondary | ICD-10-CM

## 2024-03-20 DIAGNOSIS — C8408 Mycosis fungoides, lymph nodes of multiple sites: Secondary | ICD-10-CM | POA: Diagnosis present

## 2024-03-20 LAB — CMP (CANCER CENTER ONLY)
ALT: 13 U/L (ref 0–44)
AST: 21 U/L (ref 15–41)
Albumin: 4.4 g/dL (ref 3.5–5.0)
Alkaline Phosphatase: 71 U/L (ref 38–126)
Anion gap: 6 (ref 5–15)
BUN: 19 mg/dL (ref 8–23)
CO2: 28 mmol/L (ref 22–32)
Calcium: 9.3 mg/dL (ref 8.9–10.3)
Chloride: 104 mmol/L (ref 98–111)
Creatinine: 0.93 mg/dL (ref 0.44–1.00)
GFR, Estimated: >60 mL/min (ref >60)
Glucose, Bld: 97 mg/dL (ref 70–99)
Potassium: 4.2 mmol/L (ref 3.5–5.1)
Sodium: 138 mmol/L (ref 135–145)
Total Bilirubin: 0.6 mg/dL (ref 0.0–1.2)
Total Protein: 8.1 g/dL (ref 6.5–8.1)

## 2024-03-20 LAB — CBC WITH DIFFERENTIAL (CANCER CENTER ONLY)
Abs Immature Granulocytes: 0.01 10*3/uL (ref 0.00–0.07)
Basophils Absolute: 0 10*3/uL (ref 0.0–0.1)
Basophils Relative: 1 %
Eosinophils Absolute: 0.1 10*3/uL (ref 0.0–0.5)
Eosinophils Relative: 2 %
HCT: 36.8 % (ref 36.0–46.0)
Hemoglobin: 12.9 g/dL (ref 12.0–15.0)
Immature Granulocytes: 0 %
Lymphocytes Relative: 31 %
Lymphs Abs: 0.7 10*3/uL (ref 0.7–4.0)
MCH: 32.8 pg (ref 26.0–34.0)
MCHC: 35.1 g/dL (ref 30.0–36.0)
MCV: 93.6 fL (ref 80.0–100.0)
Monocytes Absolute: 0.3 10*3/uL (ref 0.1–1.0)
Monocytes Relative: 13 %
Neutro Abs: 1.3 10*3/uL — ABNORMAL LOW (ref 1.7–7.7)
Neutrophils Relative %: 53 %
Platelet Count: 159 10*3/uL (ref 150–400)
RBC: 3.93 MIL/uL (ref 3.87–5.11)
RDW: 11.9 % (ref 11.5–15.5)
WBC Count: 2.4 10*3/uL — ABNORMAL LOW (ref 4.0–10.5)
nRBC: 0 % (ref 0.0–0.2)

## 2024-03-20 LAB — LIPID PANEL
Cholesterol: 210 mg/dL — ABNORMAL HIGH (ref 0–200)
HDL: 56 mg/dL (ref >40)
LDL Cholesterol: 141 mg/dL — ABNORMAL HIGH (ref 0–99)
Total CHOL/HDL Ratio: 3.8 ratio
Triglycerides: 66 mg/dL (ref <150)
VLDL: 13 mg/dL (ref 0–40)

## 2024-03-21 LAB — T4: T4, Total: 9.5 ug/dL (ref 4.5–12.0)

## 2024-03-22 ENCOUNTER — Ambulatory Visit (HOSPITAL_BASED_OUTPATIENT_CLINIC_OR_DEPARTMENT_OTHER): Payer: Medicare Other | Admitting: Cardiology

## 2024-03-22 ENCOUNTER — Encounter (HOSPITAL_BASED_OUTPATIENT_CLINIC_OR_DEPARTMENT_OTHER): Payer: Self-pay | Admitting: Cardiology

## 2024-03-22 VITALS — BP 126/68 | HR 70 | Ht 63.0 in | Wt 134.3 lb

## 2024-03-22 DIAGNOSIS — E7800 Pure hypercholesterolemia, unspecified: Secondary | ICD-10-CM

## 2024-03-22 DIAGNOSIS — T466X5D Adverse effect of antihyperlipidemic and antiarteriosclerotic drugs, subsequent encounter: Secondary | ICD-10-CM

## 2024-03-22 DIAGNOSIS — I2510 Atherosclerotic heart disease of native coronary artery without angina pectoris: Secondary | ICD-10-CM | POA: Diagnosis not present

## 2024-03-22 DIAGNOSIS — I10 Essential (primary) hypertension: Secondary | ICD-10-CM | POA: Diagnosis not present

## 2024-03-22 DIAGNOSIS — Z7189 Other specified counseling: Secondary | ICD-10-CM

## 2024-03-22 DIAGNOSIS — M791 Myalgia, unspecified site: Secondary | ICD-10-CM

## 2024-03-22 MED ORDER — EZETIMIBE 10 MG PO TABS: 10.0000 mg | ORAL_TABLET | Freq: Every day | ORAL | 3 refills | Status: DC

## 2024-03-22 NOTE — Patient Instructions (Addendum)
 Medication Instructions:  RESUME YOUR EZETIMIBE  DAILY   *If you need a refill on your cardiac medications before your next appointment, please call your pharmacy*  Lab Work: NONE  Testing/Procedures: NONE  Follow-Up: At [facility], you and your health needs are our priority.  As part of our continuing mission to provide you with exceptional heart care, our providers are all part of one team.  This team includes your primary Cardiologist (physician) and Advanced Practice Providers or APPs (Physician Assistants and Nurse Practitioners) who all work together to provide you with the care you need, when you need it.  Your next appointment:   3 TO 4 month(s)  Provider:   Sheryle Donning, MD, Slater Duncan, NP, or Neomi Banks, NP    We recommend signing up for the patient portal called "MyChart".  Sign up information is provided on this After Visit Summary.  MyChart is used to connect with patients for Virtual Visits (Telemedicine).  Patients are able to view lab/test results, encounter notes, upcoming appointments, etc.  Non-urgent messages can be sent to your provider as well.   To learn more about what you can do with MyChart, go to [email or web address].

## 2024-03-22 NOTE — Progress Notes (Signed)
 Cardiology Office Note:  .   Date:  03/22/2024  ID:  Alexa [name], DOB [birth year], MRN [id number] PCP: Alexa Krauss, PA-C  [facility] Providers Cardiologist:  Sheryle Donning, MD {  History of Present Illness: .   Alexa [name] is a 73 y.o. female with PMH hypertension, hyperlipidemia, palpitations, T cell lymphoma and colorectal cancer. She was previously followed by Dr. Berry Bristol and Dr. Albert Huff, transferred care to [facility] 09/21/23.  Pertinent CV history:Calcium score in 2022 was 34.2, which was 63rd percentile. She had declined statins, has tolerated ezetimibe .  Family history: mother, father, and two brothers have HTN. Two sisters have mildly elevated BP. Mother died age 73 of MI, had not had known CAD prior. Father in his 4s had MI, then had CHF later in life. Died from an aneurysm. Oldest brother had MI in his early 21s. Youngest brother had 4V CABG in his early 106s.   Lipid history:  Tchol 213, HDL 48, LDL 137, TG 142 in 08/2023 (on ezetimibe ) Stopped Zetia , started pravastatin  5 mg once a week Tchol 251, HDL 63, LDL 163, TG 123 10/20/23 Recheck on same pravastatin  dose of 5 mg once a week and one week of being back on the zetia  Tchol 228, HDL 52, LDL 155, TG 104 12/01/23  Today: Overall doing well. Had stopped her pravastatin  due to issues with other meds, hasn't restarted yet. Had mild muscle aches on 1/2 tab pravastatin , intermittent, worse with working out. Recalled that she tried rosuvastatin in the past as well.  Lipids 03/20/24 showed Tchol 210, TG 66, HDL 56, LDL 141.  Recent scans showed fatty liver. Discussed statin data on this.   ROS: Denies chest pain, shortness of breath at rest or with normal exertion. No PND, orthopnea, LE edema or unexpected weight gain. No syncope or palpitations. ROS otherwise negative except as noted.   Studies Reviewed: Alexa [name]    EKG:       Physical Exam:   VS:  BP 126/68   Pulse 70   Ht 5\' 3"  (1.6 m)   Wt 134 lb  4.8 oz (60.9 kg)   LMP 11/07/2002   SpO2 99%   BMI 23.79 kg/m    Wt Readings from Last 3 Encounters:  03/22/24 134 lb 4.8 oz (60.9 kg)  12/05/23 131 lb 3.2 oz (59.5 kg)  11/30/23 130 lb (59 kg)    GEN: Well nourished, well developed in no acute distress HEENT: Normal, moist mucous membranes NECK: No JVD CARDIAC: regular rhythm, normal S1 and S2, no rubs or gallops. No murmur. VASCULAR: Radial and DP pulses 2+ bilaterally. No carotid bruits RESPIRATORY:  Clear to auscultation without rales, wheezing or rhonchi  ABDOMEN: Soft, non-tender, non-distended MUSCULOSKELETAL:  Ambulates independently SKIN: Warm and dry, no edema NEUROLOGIC:  Alert and oriented x 3. No focal neuro deficits noted. PSYCHIATRIC:  Normal affect    ASSESSMENT AND PLAN: .    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  -most recent lipids off pravastatin ; reported mild myalgia, worse with exertion, even on low dose pravastatin  (10 mg). Myalgia is gone off pravastatin  -discussed other statins, risk of myalgia -has tolerated ezetimibe  before -discussed options. After shared decision making, will go back to ezetimibe  for now as she is about to restart one of her chemo meds. Will re-discuss either trialing atorvastatin, nexletol, or PCSK9i at follow up based on long  term plans for her lymphoma management. Need to make sure that there are no interactions between treatments  CV risk counseling and prevention -recommend heart healthy/Mediterranean diet, with whole grains, fruits, vegetable, fish, lean meats, nuts, and olive oil. Limit salt. -recommend moderate walking, 3-5 times/week for 30-50 minutes each session. Aim for at least 150 minutes.week. Goal should be pace of 3 miles/hours, or walking 1.5 miles in 30 minutes -recommend avoidance of tobacco products. Avoid excess  alcohol.  Dispo: 3-4 mos or sooner as needed  Signed, Sheryle Donning, MD   Sheryle Donning, MD, PhD, [facility]  [facility]  [place]  Heart & Vascular at [facility] at [facility] [address], [address] [place], [place] [address] [phone]

## 2024-03-23 ENCOUNTER — Other Ambulatory Visit: Payer: Self-pay

## 2024-03-26 ENCOUNTER — Encounter (HOSPITAL_BASED_OUTPATIENT_CLINIC_OR_DEPARTMENT_OTHER): Payer: Self-pay | Admitting: Obstetrics & Gynecology

## 2024-04-07 ENCOUNTER — Encounter (HOSPITAL_BASED_OUTPATIENT_CLINIC_OR_DEPARTMENT_OTHER): Payer: Self-pay | Admitting: Obstetrics & Gynecology

## 2024-04-08 ENCOUNTER — Other Ambulatory Visit (HOSPITAL_BASED_OUTPATIENT_CLINIC_OR_DEPARTMENT_OTHER): Payer: Self-pay | Admitting: Obstetrics & Gynecology

## 2024-04-08 DIAGNOSIS — M810 Age-related osteoporosis without current pathological fracture: Secondary | ICD-10-CM

## 2024-04-16 ENCOUNTER — Other Ambulatory Visit: Payer: Self-pay

## 2024-04-16 DIAGNOSIS — C8408 Mycosis fungoides, lymph nodes of multiple sites: Secondary | ICD-10-CM

## 2024-04-17 ENCOUNTER — Encounter (HOSPITAL_BASED_OUTPATIENT_CLINIC_OR_DEPARTMENT_OTHER): Payer: Self-pay | Admitting: Obstetrics & Gynecology

## 2024-04-19 ENCOUNTER — Inpatient Hospital Stay: Attending: Hematology

## 2024-04-19 ENCOUNTER — Other Ambulatory Visit

## 2024-04-19 ENCOUNTER — Other Ambulatory Visit: Payer: Self-pay

## 2024-04-19 DIAGNOSIS — C8408 Mycosis fungoides, lymph nodes of multiple sites: Secondary | ICD-10-CM | POA: Insufficient documentation

## 2024-04-19 LAB — CMP (CANCER CENTER ONLY)
ALT: 17 U/L (ref 0–44)
AST: 27 U/L (ref 15–41)
Albumin: 4.2 g/dL (ref 3.5–5.0)
Alkaline Phosphatase: 78 U/L (ref 38–126)
Anion gap: 9 (ref 5–15)
BUN: 14 mg/dL (ref 8–23)
CO2: 25 mmol/L (ref 22–32)
Calcium: 9.3 mg/dL (ref 8.9–10.3)
Chloride: 103 mmol/L (ref 98–111)
Creatinine: 0.97 mg/dL (ref 0.44–1.00)
GFR, Estimated: >60 mL/min (ref >60)
Glucose, Bld: 81 mg/dL (ref 70–99)
Potassium: 4.2 mmol/L (ref 3.5–5.1)
Sodium: 137 mmol/L (ref 135–145)
Total Bilirubin: 0.8 mg/dL (ref 0.0–1.2)
Total Protein: 8.1 g/dL (ref 6.5–8.1)

## 2024-04-19 LAB — CBC WITH DIFFERENTIAL (CANCER CENTER ONLY)
Abs Immature Granulocytes: 0 10*3/uL (ref 0.00–0.07)
Basophils Absolute: 0 10*3/uL (ref 0.0–0.1)
Basophils Relative: 0 %
Eosinophils Absolute: 0.1 10*3/uL (ref 0.0–0.5)
Eosinophils Relative: 2 %
HCT: 36.1 % (ref 36.0–46.0)
Hemoglobin: 13.1 g/dL (ref 12.0–15.0)
Immature Granulocytes: 0 %
Lymphocytes Relative: 26 %
Lymphs Abs: 0.7 10*3/uL (ref 0.7–4.0)
MCH: 33.2 pg (ref 26.0–34.0)
MCHC: 36.3 g/dL — ABNORMAL HIGH (ref 30.0–36.0)
MCV: 91.6 fL (ref 80.0–100.0)
Monocytes Absolute: 0.3 10*3/uL (ref 0.1–1.0)
Monocytes Relative: 11 %
Neutro Abs: 1.7 10*3/uL (ref 1.7–7.7)
Neutrophils Relative %: 61 %
Platelet Count: 162 10*3/uL (ref 150–400)
RBC: 3.94 MIL/uL (ref 3.87–5.11)
RDW: 12 % (ref 11.5–15.5)
WBC Count: 2.9 10*3/uL — ABNORMAL LOW (ref 4.0–10.5)
nRBC: 0 % (ref 0.0–0.2)

## 2024-05-15 ENCOUNTER — Other Ambulatory Visit

## 2024-06-11 ENCOUNTER — Ambulatory Visit (HOSPITAL_BASED_OUTPATIENT_CLINIC_OR_DEPARTMENT_OTHER)

## 2024-06-11 ENCOUNTER — Other Ambulatory Visit (HOSPITAL_BASED_OUTPATIENT_CLINIC_OR_DEPARTMENT_OTHER)

## 2024-06-19 ENCOUNTER — Ambulatory Visit

## 2024-06-19 DIAGNOSIS — R911 Solitary pulmonary nodule: Secondary | ICD-10-CM

## 2024-06-19 DIAGNOSIS — M810 Age-related osteoporosis without current pathological fracture: Secondary | ICD-10-CM | POA: Diagnosis not present

## 2024-06-27 ENCOUNTER — Ambulatory Visit
Admission: RE | Admit: 2024-06-27 | Discharge: 2024-06-27 | Disposition: A | Discharge status: Home / Self Care | Source: Ambulatory Visit | Attending: Obstetrics & Gynecology | Admitting: Obstetrics & Gynecology

## 2024-06-27 DIAGNOSIS — Z1231 Encounter for screening mammogram for malignant neoplasm of breast: Secondary | ICD-10-CM

## 2024-06-28 ENCOUNTER — Ambulatory Visit (HOSPITAL_BASED_OUTPATIENT_CLINIC_OR_DEPARTMENT_OTHER): Payer: Self-pay | Admitting: Obstetrics & Gynecology

## 2024-06-28 ENCOUNTER — Ambulatory Visit (HOSPITAL_BASED_OUTPATIENT_CLINIC_OR_DEPARTMENT_OTHER): Admitting: Obstetrics & Gynecology

## 2024-06-28 ENCOUNTER — Other Ambulatory Visit (HOSPITAL_COMMUNITY)
Admission: RE | Admit: 2024-06-28 | Discharge: 2024-06-28 | Disposition: A | Discharge status: Home / Self Care | Source: Ambulatory Visit | Attending: Obstetrics & Gynecology | Admitting: Obstetrics & Gynecology

## 2024-06-28 ENCOUNTER — Encounter (HOSPITAL_BASED_OUTPATIENT_CLINIC_OR_DEPARTMENT_OTHER): Payer: Self-pay | Admitting: Obstetrics & Gynecology

## 2024-06-28 VITALS — BP 114/64 | HR 65 | Ht 63.0 in | Wt 130.0 lb

## 2024-06-28 DIAGNOSIS — Z9189 Other specified personal risk factors, not elsewhere classified: Secondary | ICD-10-CM | POA: Diagnosis not present

## 2024-06-28 DIAGNOSIS — Z8739 Personal history of other diseases of the musculoskeletal system and connective tissue: Secondary | ICD-10-CM | POA: Diagnosis not present

## 2024-06-28 DIAGNOSIS — C19 Malignant neoplasm of rectosigmoid junction: Secondary | ICD-10-CM

## 2024-06-28 DIAGNOSIS — Z780 Asymptomatic menopausal state: Secondary | ICD-10-CM

## 2024-06-28 DIAGNOSIS — C8590 Non-Hodgkin lymphoma, unspecified, unspecified site: Secondary | ICD-10-CM

## 2024-06-28 DIAGNOSIS — Z85048 Personal history of other malignant neoplasm of rectum, rectosigmoid junction, and anus: Secondary | ICD-10-CM | POA: Insufficient documentation

## 2024-06-28 DIAGNOSIS — Z124 Encounter for screening for malignant neoplasm of cervix: Secondary | ICD-10-CM

## 2024-06-28 DIAGNOSIS — Z1151 Encounter for screening for human papillomavirus (HPV): Secondary | ICD-10-CM | POA: Insufficient documentation

## 2024-06-28 NOTE — Progress Notes (Signed)
 Breast and Pelvic Exam Patient name: Alexa [name] MRN [id number]  Date of birth: 1951-05-28 Chief Complaint:   Breast and Pelvic Exam  History of Present Illness:   Alexa [name] is a 73 y.o. G1P0 Caucasian female being seen today for breast and pelvic exam.  She did have a bone density last week.  Will plan to repeat in 2 years.  Denies vaginal bleeding.    Still followed at [facility] for 6 months follow up.  Last two blood tests showed no markers so no detection of cancer cells.  She says her provider will not say it is in remission.    Patient's last menstrual period was 11/07/2002.   Last pap 05/26/2022. Results were: normal. H/O abnormal pap: yes Last mammogram: 06/27/2024, results pending.   Last colonoscopy: 04/2024. Results were: normal. Family h/o colorectal cancer: no.  Pt is going to have copy sent to me.   DEXA:  06/21/2024.  T score -2.6 in forearm     06/23/2023   11:07 AM 06/21/2023   10:13 AM 05/26/2022    1:20 PM 11/10/2021    8:21 AM 05/03/2019   11:36 AM  Depression screen PHQ 2/9  Decreased Interest 0 0 0 0 1  Down, Depressed, Hopeless 0 0 0 0 1  PHQ - 2 Score 0 0 0 0 2  Altered sleeping     2  Tired, decreased energy     2  Change in appetite     2  Feeling bad or failure about yourself      0  Trouble concentrating     1  Moving slowly or fidgety/restless     0  Suicidal thoughts     0  PHQ-9 Score     9    Review of Systems:   Pertinent items are noted in HPI Denies any urinary or bowel changes.  Does has some chronic abdominal pains. Pertinent History Reviewed:  Reviewed past medical,surgical, social and family history.  Reviewed problem list, medications and allergies. Physical Assessment:   Vitals:   06/28/24 0932  BP: 114/64  Pulse: 65  SpO2: 99%  Weight: 130 lb (59 kg)  Height: 5' 3 (1.6 m)  Body mass index is 23.03 kg/m.        Physical Examination:   General appearance - well appearing, and in no distress  Mental status -  alert, oriented to person, place, and time  Psych:  She has a normal mood and affect  Skin - warm and dry, normal color, no suspicious lesions noted  Chest - effort normal, all lung fields clear to auscultation bilaterally  Heart - normal rate and regular rhythm  Neck:  midline trachea, no thyromegaly or nodules  Breasts - breasts appear normal, no suspicious masses, no skin or nipple changes or  axillary nodes  Abdomen - soft, nontender, nondistended, no masses or organomegaly  Pelvic - VULVA: normal appearing vulva with no masses, tenderness or lesions   VAGINA: normal appearing vagina with normal color and discharge, no lesions   CERVIX: normal appearing cervix without discharge or lesions, no CMT  Thin prep pap is updated today  UTERUS: uterus is felt to be normal size, shape, consistency and nontender   ADNEXA: No adnexal masses or tenderness noted.  Rectal - normal rectal, good sphincter tone, no masses felt.   Extremities:  No swelling or varicosities noted  Chaperone present for exam  No results found for this or any previous visit (  from the past 24 hours).  Assessment & Plan:  1. GYN exam for high-risk Medicare patient (Primary) - Pap smear updated today.  HR HPV was updated today.  - Mammogram done yesterday - Colonoscopy done earlier this summer.  Pt will have results sent to me. - Bone mineral density 06/21/2024. - lab work done with PCP - vaccines reviewed/updated  2. Cervical cancer screening - Cytology - PAP( [facility]) - PR OBTAINING SCREEN PAP SMEAR  3. Colorectal cancer, stage I (HCC) - followed by Dr. Kristie  4. T-cell lymphoma (HCC) - followed at [facility]  5. Postmenopausal - no HRT  6. History of osteoporosis - BMD was improved.  Will repeat in two years.      Meds: No orders of the defined types were placed in this encounter.   Follow-up: No follow-ups on file.  Ronal GORMAN Pinal, MD 06/28/2024 10:03 AM

## 2024-07-02 ENCOUNTER — Ambulatory Visit (HOSPITAL_BASED_OUTPATIENT_CLINIC_OR_DEPARTMENT_OTHER): Payer: Self-pay | Admitting: Obstetrics & Gynecology

## 2024-07-02 LAB — CYTOLOGY - PAP
Comment: NEGATIVE
Diagnosis: REACTIVE
High risk HPV: NEGATIVE

## 2024-07-04 ENCOUNTER — Ambulatory Visit (INDEPENDENT_AMBULATORY_CARE_PROVIDER_SITE_OTHER): Admitting: Family Medicine

## 2024-07-04 ENCOUNTER — Encounter: Payer: Self-pay | Admitting: Family Medicine

## 2024-07-04 ENCOUNTER — Ambulatory Visit: Payer: Self-pay | Admitting: Pulmonary Disease

## 2024-07-04 VITALS — BP 129/74 | HR 85 | Temp 98.4°F | Ht 63.0 in | Wt 130.8 lb

## 2024-07-04 DIAGNOSIS — C8590 Non-Hodgkin lymphoma, unspecified, unspecified site: Secondary | ICD-10-CM | POA: Diagnosis not present

## 2024-07-04 DIAGNOSIS — M8580 Other specified disorders of bone density and structure, unspecified site: Secondary | ICD-10-CM | POA: Diagnosis not present

## 2024-07-04 DIAGNOSIS — Z0000 Encounter for general adult medical examination without abnormal findings: Secondary | ICD-10-CM | POA: Diagnosis not present

## 2024-07-04 DIAGNOSIS — E7849 Other hyperlipidemia: Secondary | ICD-10-CM

## 2024-07-04 DIAGNOSIS — I10 Essential (primary) hypertension: Secondary | ICD-10-CM | POA: Diagnosis not present

## 2024-07-04 DIAGNOSIS — K760 Fatty (change of) liver, not elsewhere classified: Secondary | ICD-10-CM | POA: Diagnosis not present

## 2024-07-04 NOTE — Patient Instructions (Signed)
 It was nice to meet you today!  Follow up with me in 1 year, sooner if needed  Be well, Dr. Donah

## 2024-07-04 NOTE — Progress Notes (Signed)
  Date of Visit: 07/04/2024   SUBJECTIVE:   HPI:  Alexa [name] presents today for a new patient appointment to establish general primary care. Discussed the use of AI scribe software for clinical note transcription with the patient, who gave verbal consent to proceed.  Prior PCP: Margit Meres PA, last seen earlier this year for AWV  Other care team members:  - cardiology - Dr. Dawna Bruckner - dermatology - Dr. Kelly Comer ([facility]) - pulmonology - Dr. Slater Staff - GI - Dr. Renaye Sous - GYN - Dr. Elvie Pinal  Concerns today:  - fatty liver seen on recent CT imaging, has questions about that. - Abdominal imaging in August 2024 revealed fatty liver infiltration - Attributes hepatic steatosis to bexarotene therapy - Liver enzyme levels remain within normal limits, monitored every two months  Past Medical Hx:  - Cutaneous T-cell lymphoma - diagnosed in 2018, treated now with bexarotene, triamcinolone - sees Dr. Comer @ [facility] - osteopenia - formerly osteoporosis, does osteostrong classes, takes Vit D and K2 supplements, never did bisphosphonate - hypertension - taking amlodipine  and metoprolol  - hyperlipidemia - statin intolerant (myalgias), on zetia  - sees Dr. 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, managed short term with medication, no longer needed - rectal cancer - stage I, resected  Past Surgical Hx:  - rectal resection for stage I cancer - breast biopsy - hysteroscopy  Family Hx:  - father - MI in 61s, CHF, aortic aneurysm at age 65, skin cancers - mother - hypertension, massive MI, died 77, renal cancer in 33s-80s - older brother - MI, hypertension, CAD with stent, died of metastatic melanoma - younger brother - CAD s/p 4 vessel CABG  Social Hx:  - occupation: retired Airline pilot, stays active with pilates, Environmental manager - lives with tailless cat Brett)  Allergies - ciprofloxacin - tendon rupture - pseudoephedrine - palpitations  OBJECTIVE:    BP 129/74   Pulse 85   Temp 98.4 F (36.9 C) (Oral)   Ht 5' 3 (1.6 m)   Wt 130 lb 12.8 oz (59.3 kg)   LMP 11/07/2002   SpO2 99%   BMI 23.17 kg/m  Gen: no acute distress, pleasant, cooperative, well appearing HEENT: normocephalic, atraumatic  Heart: regular rate and rhythm, no murmur Lungs: clear to auscultation bilaterally, normal work of breathing  Neuro: alert, grossly nonfocal, speech normal Ext: No appreciable lower extremity edema bilaterally  Psych: normal range of affect, well groomed, speech normal in rate and volume, normal eye contact   ASSESSMENT/PLAN:   Assessment & Plan Fatty liver Fatty infiltration noted on CT scan. Liver function tests normal, no significant concern. - continue routine monitoring of labs through derm/oncology - Maintain regular physical activity and a fiber-rich diet. Osteopenia, unspecified location Improved from osteoporosis, likely related to past steroid use. Continue weight bearing exercise, vit D supplementation Hypertension, essential Well controlled. Continue current medication regimen.  Other hyperlipidemia Borderline high cholesterol, possibly due to cutaneous T cell lymphoma treatments. Statins not tolerated due to muscle aches.  - continue to work with cardiologist to manage cholesterol, continue zetia  - continue healthy eating & staying active Routine adult health maintenance - Current on mammogram, colonoscopy - encouraged seasonal flu vaccine when available T-cell lymphoma (HCC) Continue follow up with dermatology   FOLLOW UP: Follow up in 1 year for CPE, sooner if needed  [place] J. Donah, MD, [facility]

## 2024-07-05 NOTE — Telephone Encounter (Signed)
 FYI

## 2024-07-06 ENCOUNTER — Other Ambulatory Visit: Payer: Self-pay

## 2024-07-06 DIAGNOSIS — K760 Fatty (change of) liver, not elsewhere classified: Secondary | ICD-10-CM | POA: Insufficient documentation

## 2024-07-06 DIAGNOSIS — M8580 Other specified disorders of bone density and structure, unspecified site: Secondary | ICD-10-CM | POA: Insufficient documentation

## 2024-07-06 DIAGNOSIS — Z0000 Encounter for general adult medical examination without abnormal findings: Secondary | ICD-10-CM | POA: Insufficient documentation

## 2024-07-06 NOTE — Assessment & Plan Note (Addendum)
 Continue follow up with dermatology.

## 2024-07-06 NOTE — Assessment & Plan Note (Signed)
 Borderline high cholesterol, possibly due to cutaneous T cell lymphoma treatments. Statins not tolerated due to muscle aches.  - continue to work with cardiologist to manage cholesterol, continue zetia  - continue healthy eating & staying active

## 2024-07-06 NOTE — Assessment & Plan Note (Signed)
 Well controlled. Continue current medication regimen.

## 2024-07-06 NOTE — Assessment & Plan Note (Signed)
 Fatty infiltration noted on CT scan. Liver function tests normal, no significant concern. - continue routine monitoring of labs through derm/oncology - Maintain regular physical activity and a fiber-rich diet.

## 2024-07-06 NOTE — Assessment & Plan Note (Signed)
-   Current on mammogram, colonoscopy - encouraged seasonal flu vaccine when available

## 2024-07-06 NOTE — Assessment & Plan Note (Addendum)
 Improved from osteoporosis, likely related to past steroid use. Continue weight bearing exercise, vit D supplementation

## 2024-07-10 ENCOUNTER — Telehealth (HOSPITAL_BASED_OUTPATIENT_CLINIC_OR_DEPARTMENT_OTHER): Payer: Self-pay | Admitting: *Deleted

## 2024-07-10 ENCOUNTER — Encounter: Payer: Self-pay | Admitting: Hematology

## 2024-07-10 ENCOUNTER — Other Ambulatory Visit (HOSPITAL_COMMUNITY): Payer: Self-pay

## 2024-07-10 ENCOUNTER — Ambulatory Visit (HOSPITAL_BASED_OUTPATIENT_CLINIC_OR_DEPARTMENT_OTHER): Admitting: Cardiology

## 2024-07-10 ENCOUNTER — Encounter (HOSPITAL_BASED_OUTPATIENT_CLINIC_OR_DEPARTMENT_OTHER): Payer: Self-pay | Admitting: Cardiology

## 2024-07-10 VITALS — BP 116/66 | HR 71 | Resp 17 | Ht 63.0 in | Wt 131.0 lb

## 2024-07-10 DIAGNOSIS — T466X5D Adverse effect of antihyperlipidemic and antiarteriosclerotic drugs, subsequent encounter: Secondary | ICD-10-CM

## 2024-07-10 DIAGNOSIS — M791 Myalgia, unspecified site: Secondary | ICD-10-CM

## 2024-07-10 DIAGNOSIS — I10 Essential (primary) hypertension: Secondary | ICD-10-CM | POA: Diagnosis not present

## 2024-07-10 DIAGNOSIS — E7800 Pure hypercholesterolemia, unspecified: Secondary | ICD-10-CM | POA: Diagnosis not present

## 2024-07-10 DIAGNOSIS — I2510 Atherosclerotic heart disease of native coronary artery without angina pectoris: Secondary | ICD-10-CM | POA: Diagnosis not present

## 2024-07-10 DIAGNOSIS — T466X5A Adverse effect of antihyperlipidemic and antiarteriosclerotic drugs, initial encounter: Secondary | ICD-10-CM

## 2024-07-10 DIAGNOSIS — Z5181 Encounter for therapeutic drug level monitoring: Secondary | ICD-10-CM

## 2024-07-10 MED ORDER — REPATHA SURECLICK 140 MG/ML ~~LOC~~ SOAJ: 140.0000 mg | SUBCUTANEOUS | 3 refills | Status: DC

## 2024-07-10 NOTE — Telephone Encounter (Signed)
 Pharmacy Patient Advocate Encounter  Received notification from [employer] that Prior Authorization for REPATHA  has been APPROVED from 07/10/24 to 11/06/24. Ran test claim, Copay is $0. This test claim was processed through [facility]- copay amounts may vary at other pharmacies due to pharmacy/plan contracts, or as the patient moves through the different stages of their insurance plan.

## 2024-07-10 NOTE — Telephone Encounter (Signed)
 Advised patient, verbalized understanding

## 2024-07-10 NOTE — Telephone Encounter (Signed)
 Pavero, Christopher, [facility] to Me    07/10/24  1:46 PM No contraindications   Mychart message sent to patient

## 2024-07-10 NOTE — Progress Notes (Signed)
 Cardiology Office Note:  .   Date:  07/10/2024  ID:  Alexa [name], DOB [birth year], MRN [id number] PCP: Donah Laymon PARAS, MD  [facility] Providers Cardiologist:  Shelda Bruckner, MD {  History of Present Illness: .   Alexa [name] is a 73 y.o. female with PMH hypertension, hyperlipidemia, palpitations, T cell lymphoma and colorectal cancer. She was previously followed by Dr. Ladona and Dr. Michele, transferred care to [facility] 09/21/23.  Pertinent CV history:Calcium score in 2022 was 34.2, which was 63rd percentile. She had declined statins, has tolerated ezetimibe .  Family history: mother, father, and two brothers have HTN. Two sisters have mildly elevated BP. Mother died age 28 of MI, had not had known CAD prior. Father in his 64s had MI, then had CHF later in life. Died from an aneurysm. Oldest brother had MI in his early 46s. Youngest brother had 4V CABG in his early 66s.   Lipid history:  Tchol 213, HDL 48, LDL 137, TG 142 in 08/2023 (on ezetimibe ) Stopped Zetia , started pravastatin  5 mg once a week Tchol 251, HDL 63, LDL 163, TG 123 10/20/23 Recheck on same pravastatin  dose of 5 mg once a week and one week of being back on the zetia  Tchol 228, HDL 52, LDL 155, TG 104 12/01/23  Today: Overall doing well. Lipids 03/20/24 showed Tchol 210, TG 66, HDL 56, LDL 141. On zetia  alone as she did not tolerate pravastatin .  We discussed cholesterol guidelines and options for management at length, see below.  ROS: Denies chest pain, shortness of breath at rest or with normal exertion. No PND, orthopnea, LE edema or unexpected weight gain. No syncope or palpitations. ROS otherwise negative except as noted.   Studies Reviewed: SABRA    EKG:       Physical Exam:   VS:  BP 116/66 (BP Location: Left Arm, Patient Position: Sitting, Cuff Size: Normal)   Pulse 71   Resp 17   Ht 5' 3 (1.6 m)   Wt 131 lb (59.4 kg)   LMP 11/07/2002   SpO2 96%   BMI 23.21 kg/m    Wt  Readings from Last 3 Encounters:  07/10/24 131 lb (59.4 kg)  07/04/24 130 lb 12.8 oz (59.3 kg)  06/28/24 130 lb (59 kg)    GEN: Well nourished, well developed in no acute distress HEENT: Normal, moist mucous membranes NECK: No JVD CARDIAC: regular rhythm, normal S1 and S2, no rubs or gallops. No murmur. VASCULAR: Radial and DP pulses 2+ bilaterally. No carotid bruits RESPIRATORY:  Clear to auscultation without rales, wheezing or rhonchi  ABDOMEN: Soft, non-tender, non-distended MUSCULOSKELETAL:  Ambulates independently SKIN: Warm and dry, no edema NEUROLOGIC:  Alert and oriented x 3. No focal neuro deficits noted. PSYCHIATRIC:  Normal affect    ASSESSMENT AND PLAN: .    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  -most recent lipids off pravastatin ; reported mild myalgia, worse with exertion, even on low dose pravastatin  (10 mg). Myalgia is gone off pravastatin  -discussed other statins, risk of myalgia. Did not tolerate rosuvastatin either.  -has tolerated ezetimibe  only, labs not at goal. Labs from last week through [facility] show Tchol 234, TG 121, HDL 54, LDL 158. Goal LDL <70. -taking fish oil to manage TG. We discussed data that this can change numbers but not risk. She will try lifestyle change -she has  never had gout, but it runs in her family. Would need to run nexletol with her meds especially targretin. She has had tendon rupture on cipro, is concerned about risk of this with nexletol-ezetimibe  -discussed PCSK9i, she has done injections previously without issue. Reviewed mAb. I ran interaction checker with her medications, does not appear to have significant interactions. She is willing to try, will send to pharmacy. She would like nurse visit to do first injection, she will call us .  Fatty liver History of myosis  fungoides -stopped pegasys, still on targretin  CV risk counseling and prevention -recommend heart healthy/Mediterranean diet, with whole grains, fruits, vegetable, fish, lean meats, nuts, and olive oil. Limit salt. -recommend moderate walking, 3-5 times/week for 30-50 minutes each session. Aim for at least 150 minutes.week. Goal should be pace of 3 miles/hours, or walking 1.5 miles in 30 minutes -recommend avoidance of tobacco products. Avoid excess alcohol.  Dispo: 3-4 mos or sooner as needed  Signed, Shelda Bruckner, MD   Shelda Bruckner, MD, PhD, [facility]  [facility]    Heart & Vascular at [facility] at [facility] [address] [place], [place] [address] [phone]

## 2024-07-10 NOTE — Telephone Encounter (Signed)
 Patient in the office for visit with Dr Lonni  Dr Lonni would like for patient to start Repatha  every 2 weeks Patient would like to make sure this does not interfere with any of her medications or be contraindicated with her cancer   Will forward to Dr Lonni and Berdine D for review

## 2024-07-10 NOTE — Telephone Encounter (Signed)
 Pharmacy Patient Advocate Encounter   Received notification from Physician's Office that prior authorization for REPATHA  is required/requested.   Insurance verification completed.   The patient is insured through [place] .   Per test claim: PA required; PA submitted to above mentioned insurance via Latent Key/confirmation #/EOC [id number] Status is pending

## 2024-07-10 NOTE — Telephone Encounter (Signed)
 Patient needs to start Repatha , will forward to PA to initiate

## 2024-07-10 NOTE — Patient Instructions (Signed)
 Medication Instructions:  START REPATHA  1 INJECTION EVERY 2 WEEKS  *If you need a refill on your cardiac medications before your next appointment, please call your pharmacy*  Lab Work: FASTING LP/LFT'S ABOUT 2 MONTHS AFTER STARTING REPATHA   If you have labs (blood work) drawn today and your tests are completely normal, you will receive your results only by: MyChart Message (if you have MyChart) OR A paper copy in the mail If you have any lab test that is abnormal or we need to change your treatment, we will call you to review the results.  Testing/Procedures: NONE  Follow-Up: At [facility], you and your health needs are our priority.  As part of our continuing mission to provide you with exceptional heart care, our providers are all part of one team.  This team includes your primary Cardiologist (physician) and Advanced Practice Providers or APPs (Physician Assistants and Nurse Practitioners) who all work together to provide you with the care you need, when you need it.  Your next appointment:   12 month(s)  Provider:   Shelda Bruckner, MD, Rosaline Bane, NP, or Reche Finder, NP    We recommend signing up for the patient portal called MyChart.  Sign up information is provided on this After Visit Summary.  MyChart is used to connect with patients for Virtual Visits (Telemedicine).  Patients are able to view lab/test results, encounter notes, upcoming appointments, etc.  Non-urgent messages can be sent to your provider as well.   To learn more about what you can do with MyChart, go to [email or web address].   Other Instructions  Subcutaneous Injection Instructions Using a Prefilled Syringe A subcutaneous injection is a shot of medicine that is given into the layer of fat and tissue between skin and muscle. The injection is given with a single-use syringe that is already filled with medicine. The syringe is called a prefilled syringe. Read the medicine guide or  package insert that came with the syringe. Follow directions from the guide about how to prepare and give the injection. This is important because the directions may be different for each medicine. Use only the syringe, needle, and medicine that your health care provider prescribes. Use each prefilled syringe and needle only one time. Supplies needed: Prefilled syringe with needle. Use the needle length and size (gauge) that your provider or pharmacist gives to you. Alcohol wipes. Gauze. Bandage. A container to put used syringes. This may be a sharps container or a hard plastic container that has a secure lid, such as an empty laundry detergent bottle. How to choose a site for injection Follow instructions from your provider about where to give an injection. Do not inject in the same spot each time. There are five main areas that can be used for injecting. These areas include: Abdomen. Avoid the area that is within 2 inches (5 cm) of your navel (umbilicus). Front of thigh. Upper, outer side of thigh. Upper, outer side of arm. Upper, outer part of butt. How to give an injection using a prefilled syringe  Wash your hands with soap and water. If soap and water are not available, use hand sanitizer. Use an alcohol wipe to clean the site where you will be injecting the needle. Let the site air-dry. Remove the plastic cover from the needle on the syringe. Do not let the needle touch anything. Hold the syringe with the needle pointing up. Check the syringe for any remaining air bubbles. If there are air bubbles, flick the syringe with  your finger until the air bubbles rise to the top. Then, gently push on the plunger until you can see a drop of medicine appear at the tip of the needle. This will clear any remaining air bubbles from the syringe. Hold the syringe in your writing hand like a pencil. Use your other hand to pinch and hold about an inch (2.5 cm) of skin. Do not directly touch the cleaned  part of the skin. Gently but quickly, put the needle straight into the skin. The needle should be at a 90-degree angle to the skin. The needle may need to be injected at a 45-degree angle in thin adults or children who have a small amount of body fat. Follow instructions from your provider about the right size needle and angle you should use for the injection. After the needle is completely inserted into the skin, release the skin that you are pinching. Continue to hold the syringe with your writing hand. Use your thumb or index finger of your writing hand to push the plunger all the way into the syringe to inject the medicine. Pull the needle straight out of the skin. If there is bleeding: Press and hold a piece of gauze over the injection site until bleeding stops. Do not rub the area. Cover the injection site with a bandage, if needed. How to safely throw away the supplies If you are using a syringe that does not have a safety system for shielding the needle after injection: Do not recap the needle. Place the syringe and needle in the disposal container. If your syringe has a safety system for shielding the needle after injection: Firmly push down on the plunger after you complete the injection. The protective sleeve will automatically cover the needle, and you will hear a click. The click means that the needle is safely covered. Follow the disposal regulations for the area where you live. Do not use any syringe or needle more than one time. Contact a health care provider if: You have trouble giving the injection. You think that the injection was not given correctly. You have trouble with any of the supplies. The medicine causes side effects. You get a rash on your skin. A get a fever. The condition that is being treated gets worse. Get help right away if: You get any of these symptoms after the injection is given: Trouble breathing. Chest pain. A rash over most or all of your  body. Swelling of the lips or tongue. Trouble swallowing. These symptoms may be an emergency. Get help right away. Call 911. Do not wait to see if the symptoms will go away. Do not drive yourself to the hospital. This information is not intended to replace advice given to you by your health care provider. Make sure you discuss any questions you have with your health care provider. Document Revised: 08/11/2022 Document Reviewed: 08/11/2022 Elsevier Patient Education  2024 [employer].

## 2024-07-10 NOTE — Telephone Encounter (Signed)
 Discussed with Dr Lonni, no contraindications   Advised patient, verbalized understanding

## 2024-07-22 ENCOUNTER — Ambulatory Visit (HOSPITAL_BASED_OUTPATIENT_CLINIC_OR_DEPARTMENT_OTHER): Admitting: Pulmonary Disease

## 2024-07-24 ENCOUNTER — Telehealth: Payer: Self-pay | Admitting: Cardiology

## 2024-07-24 NOTE — Telephone Encounter (Signed)
 Pt calling in to speak with Kristin, RPH-CPP about her Repatha  Rx. Please advise.

## 2024-07-24 NOTE — Telephone Encounter (Signed)
 Pt was concern about Bexarotene and Repatha  drug -drug interaction. Wants to double check there is no interaction and Repatha  is safe to use in type of cancer that she has - cutaneous T cell lymphoma.   Assured pt that there is no drug-drug or drug-disease interactions. Pt will be starting Repatha  later this week and if any problem with it will call us .

## 2024-07-25 MED ORDER — REPATHA SURECLICK 140 MG/ML ~~LOC~~ SOAJ: 140.0000 mg | SUBCUTANEOUS | 3 refills | Status: DC

## 2024-07-25 NOTE — Addendum Note (Signed)
 Addended by: FREDIRICK BEAU B on: 07/25/2024 05:49 PM   Modules accepted: Orders

## 2024-07-29 MED ORDER — REPATHA SURECLICK 140 MG/ML ~~LOC~~ SOAJ: 140.0000 mg | SUBCUTANEOUS | 3 refills | Status: AC

## 2024-07-29 NOTE — Addendum Note (Signed)
 Addended by: FREDIRICK BEAU B on: 07/29/2024 11:52 AM   Modules accepted: Orders

## 2024-08-07 ENCOUNTER — Telehealth: Payer: Self-pay | Admitting: Cardiology

## 2024-08-07 NOTE — Telephone Encounter (Signed)
 Returned a call back to the pt.  Pt states Dr. Lonni told her when she gets her first Repatha  injection, if she didn't feel comfortable doing this for the first time, she could call the office and arrange for a nurse visit for coaching of 1st injection.  Pt adds she has anxiety about giving this to herself for the first time, and would like a demonstration/coaching for this.  Scheduled the pt a nurse visit appt for 10/13 at 1 pm for Coaching of 1st Repatha  injection.  Pt will bring the injection into the office with her at that visit.  Pt aware nursing will provide coaching and show her with our sample injector how to administer Repatha . Pt aware nursing will coach her through giving herself the first injection at that visit.   Advised the pt to arrive 10-15 mins prior to this visit.  Pt verbalized understanding and agrees with this plan.

## 2024-08-07 NOTE — Telephone Encounter (Signed)
  Patient would like to schedule nurse visit to help her with her first repatha  injection

## 2024-08-15 ENCOUNTER — Other Ambulatory Visit (HOSPITAL_BASED_OUTPATIENT_CLINIC_OR_DEPARTMENT_OTHER): Payer: Self-pay | Admitting: Cardiology

## 2024-08-15 DIAGNOSIS — I10 Essential (primary) hypertension: Secondary | ICD-10-CM

## 2024-08-19 ENCOUNTER — Ambulatory Visit (HOSPITAL_BASED_OUTPATIENT_CLINIC_OR_DEPARTMENT_OTHER): Admitting: *Deleted

## 2024-08-19 DIAGNOSIS — E7849 Other hyperlipidemia: Secondary | ICD-10-CM

## 2024-08-19 NOTE — Progress Notes (Signed)
   Nurse Visit   Date of Encounter: 08/19/2024 ID: Alexa [name],  1951/10/26, MRN [id number]  PCP:  Donah Laymon PARAS, MD   [facility] Providers Cardiologist:  Shelda Bruckner, MD      Visit Details   VS:  LMP 11/07/2002  , BMI There is no height or weight on file to calculate BMI.  Wt Readings from Last 3 Encounters:  07/10/24 131 lb (59.4 kg)  07/04/24 130 lb 12.8 oz (59.3 kg)  06/28/24 130 lb (59 kg)     Reason for visit: Repatha  injection teaching  Performed today: Education - patient used teaching Repatha  Pen and then injected the medication herself into left outer thigh appropriately Changes (medications, testing, etc.) : no changes or testing Length of Visit: 10 minutes    Medications Adjustments/Labs and Tests Ordered: No orders of the defined types were placed in this encounter.  No orders of the defined types were placed in this encounter.  Patient feeling good about being able to complete injections herself in the future without difficulty after the teaching/teach back today.  Alexa [name], RN  08/19/2024 1:48 PM

## 2024-08-22 ENCOUNTER — Ambulatory Visit (HOSPITAL_COMMUNITY)

## 2024-08-23 ENCOUNTER — Encounter (HOSPITAL_COMMUNITY): Payer: Self-pay

## 2024-08-23 ENCOUNTER — Telehealth: Payer: Self-pay | Admitting: Cardiology

## 2024-08-23 ENCOUNTER — Ambulatory Visit (HOSPITAL_COMMUNITY)
Admission: RE | Admit: 2024-08-23 | Discharge: 2024-08-23 | Disposition: A | Discharge status: Home / Self Care | Source: Ambulatory Visit | Attending: Physician Assistant | Admitting: Physician Assistant

## 2024-08-23 VITALS — BP 125/79 | HR 72 | Temp 98.2°F | Resp 18 | Ht 63.0 in | Wt 130.0 lb

## 2024-08-23 DIAGNOSIS — R1030 Lower abdominal pain, unspecified: Secondary | ICD-10-CM | POA: Diagnosis present

## 2024-08-23 DIAGNOSIS — R1032 Left lower quadrant pain: Secondary | ICD-10-CM | POA: Diagnosis not present

## 2024-08-23 HISTORY — DX: Diverticulosis of large intestine without perforation or abscess without bleeding: K57.30

## 2024-08-23 LAB — POCT URINALYSIS DIP (MANUAL ENTRY)
Bilirubin, UA: NEGATIVE
Glucose, UA: NEGATIVE mg/dL
Nitrite, UA: NEGATIVE
Protein Ur, POC: NEGATIVE mg/dL
Spec Grav, UA: 1.02 (ref 1.010–1.025)
Urobilinogen, UA: 0.2 U/dL
pH, UA: 5 (ref 5.0–8.0)

## 2024-08-23 NOTE — Discharge Instructions (Signed)
 Your urine did have a little bit of white blood cells and a little bit of blood.  We are sending this for culture and we will contact you if this is abnormal and we need to change her treatment plan.  I am more concerned about diverticulitis as we discussed.  Start the Augmentin that was prescribed by your gastroenterologist.  Take this twice a day for a week.  Follow-up with your primary care first thing next week.  If you have worsening abdominal pain, fever, nausea/vomiting, changes in your bowel habits you need to go to the ER as we discussed.

## 2024-08-23 NOTE — Telephone Encounter (Signed)
 Patient reports first injection on Monday in her leg.  Tues -stif1ness and a lot of soreness.  Wedneday having 5/10 low belly pain, intermittent,  maybe I'm passing a kidney stone. Slept fine. Pain still thereThursday Today it is better, but still tender to touch, no change in bowel bladder habits, 100.5 temp last night.    She said I realized that Repatha  is a monoclonal antibody.  Took cocktail of Tyl benadryl  and pepcid  to help any allergic reactions prior to her mAB treatments at cancer center.  3 am temp to 98.7.  this am back up a little. Took second round of cocktail.  Made appointment at urgent care for this afternoon.     Adv not a typical Repatha  reaction.  Adv would send to PharmD for their input and will call her back.    Wants to know - Next time, before she takes the dose, could she use the  cocktail to help prevent this.

## 2024-08-23 NOTE — ED Triage Notes (Signed)
 Abdominal pain onset 2 days ago. Denies NVD. States did have a low grade fever last night. Denies any urinary symptoms. States the abdominal pain is random and intermittent. Having tenderness in the LLQ.   Patient did start Repatha  On Monday for the first time.

## 2024-08-23 NOTE — ED Provider Notes (Signed)
 [facility]    CSN: [id number] Arrival date & time: 08/23/24  1619      History   Chief Complaint Chief Complaint  Patient presents with   Abdominal Pain   Appointment    HPI Alexa [name] is a 73 y.o. female.   Patient presents today with a 2-day history of left lower quadrant abdominal pain.  She reports that yesterday the pain was worse but today it has improved it is currently minimal.  She reports that during episodes the pain is rated 5 on a 0-10 pain scale with associated tenderness to palpation rated 3, described as soreness, localized to her left lower quadrant with radiation towards her central abdomen, no alleviating factors identified.  She reports that the only change to her routine is that she started Repatha  on Monday (08/19/2024).  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  Reports that she had blood work with her oncologist yesterday (08/22/2024) and had normal white count and her CBC/CMP were both within normal limits.  She contacted the prescriber of Repatha  and they did not believe that her abdominal discomfort is related to this medication.  She did have a fever the day after receiving the Repatha  but has been afebrile for the last 48 hours.  She has previously taken monoclonal antibodies for her cancer and so generally takes Benadryl , Pepcid , Tylenol  as a cocktail for several days around the infusion and so took a few doses of this cocktail and wonders if this could have provided improvement of symptoms.  She denies any history of diverticulitis but does have diverticulosis based on her colonoscopy from 2022.  She denies any nausea, vomiting, melena, hematochezia, changes in bowel habits.    Past Medical History:  Diagnosis Date   Adenomatous polyp    Cancer (HCC)    cutaneous T-cell lymphoma; followed by Dr. Joan @ [facility]   Diverticula of colon    Family  history of anesthesia complication    sister and sister's children postitive for MH!!!!!!, pt tested negative 3-62yrs ago   GERD (gastroesophageal reflux disease)    History of hypertension    off medication after weight loss   Hyperlipemia    Lymphoma (HCC)    light box therapy   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HCC)    Osteopenia    bilateral hips   Rectal cancer (HCC) 04/2017    Patient Active Problem List   Diagnosis Date Noted   Osteopenia 07/06/2024   Fatty liver 07/06/2024   Routine adult health maintenance 07/06/2024   Coronary atherosclerosis due to calcified coronary lesion 11/27/2022   Muscle strain of gluteal region, right, sequela 04/08/2021   Counseling regarding advance care planning and goals of care 12/04/2020   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   Situational anxiety 08/13/2019   Hypertension, essential 02/06/2018   Other hyperlipidemia 02/06/2018   Colorectal cancer, stage I (HCC) 09/19/2017   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HCC) 08/11/2016   Dysphagia 05/09/2012   Palpitations 09/26/2011    Past Surgical History:  Procedure Laterality Date   BREAST BIOPSY     COLONOSCOPY  03/07/2012   with biopsy   DILATATION & CURRETTAGE/HYSTEROSCOPY WITH RESECTOCOPE N/A 06/02/2014   Procedure: DILATATION & CURETTAGE/HYSTEROSCOPY WITH RESECTOCOPE;  Surgeon: Ronal Elvie Pinal, MD;  Location: [facility];  Service: Gynecology;  Laterality: N/A;   FOOT SURGERY     Right    TRANSANAL EXCISION OF RECTAL MASS N/A 06/09/2017  Procedure: TRANSANAL EXCISION OF RECTAL MASS;  Surgeon: Debby Hila, MD;  Location: [facility];  Service: General;  Laterality: N/A;    OB History     Gravida  1   Para  0   Term      Preterm      AB      Living  0      SAB      IAB      Ectopic      Multiple      Live Births               Home Medications    Prior to Admission medications   Medication Sig Start Date End Date Taking? Authorizing Provider  amLODipine  (NORVASC ) 5 MG  tablet Take 1.5 tablets (7.5 mg total) by mouth daily. 01/15/24  Yes Lonni Slain, MD  B Complex Vitamins (VITAMIN B COMPLEX) TABS Take by mouth. 5000 UT   Yes [provider]  bexarotene (TARGRETIN) 75 MG CAPS capsule Take 75 mg by mouth daily.   Yes [provider]  cetirizine (ZYRTEC) 10 MG tablet Take 10 mg by mouth daily.   Yes [provider]  Cholecalciferol (VITAMIN D ) 125 MCG (5000 UT) CAPS    Yes [provider]  Cranberry 1000 MG CAPS Take by mouth.   Yes [provider]  Evolocumab  (REPATHA  SURECLICK) 140 MG/ML SOAJ Inject 140 mg into the skin every 14 (fourteen) days. 07/29/24  Yes Lonni Slain, MD  famotidine  (PEPCID ) 20 MG tablet Take 20 mg by mouth as needed.   Yes [provider]  Menaquinone-7 (K2) 90 MCG/0.5ML LIQD Take 180 mcg by mouth daily.   Yes [provider]  metoprolol  succinate (TOPROL -XL) 25 MG 24 hr tablet TAKE 1/2 TABLET(12.5 MG) BY MOUTH DAILY 08/16/24  Yes Lonni Slain, MD  Probiotic Product (PROBIOTIC DAILY PO) Take by mouth.   Yes [provider]  tacrolimus (PROTOPIC) 0.1 % ointment as needed. 07/07/21  Yes [provider]  triamcinolone cream (KENALOG) 0.1 % as needed. 05/08/20  Yes [provider]  Mechlorethamine HCl (VALCHLOR) 0.016 % GEL Apply 1 Application topically. 07/02/24   [provider]    Family History Family History  Problem Relation Age of Onset   Heart attack Mother    Heart disease Mother    Kidney disease Mother    Kidney cancer Mother        contained tumor   Hypertension Mother    Anuerysm Father    Heart disease Father    Diabetes Father    Cancer Father        unknown type   Hypertension Father    Hypertension Sister    Heart attack Brother    Cancer Brother        liver cancer   Melanoma Brother    Other Brother        Bypass surgery    Ovarian cancer Paternal Grandmother        was PMP   Heart  attack Nephew    Colon cancer Neg Hx     Social History Social History   Tobacco Use   Smoking status: Never   Smokeless tobacco: Never  Vaping Use   Vaping status: Never Used  Substance Use Topics   Alcohol use: Not Currently    Alcohol/week: 0.0 - 1.0 standard drinks of alcohol   Drug use: No     Allergies   Ciprofloxacin, Pravastatin , Pseudoephedrine  hcl, Rosuvastatin, and Sudafed [pseudoephedrine hcl]   Review of Systems Review of Systems  Constitutional:  Positive for activity change. Negative for appetite change, fatigue and fever (Resolved).  Gastrointestinal:  Positive for abdominal pain. Negative for blood in stool, constipation, diarrhea, nausea and vomiting.  Genitourinary:  Negative for dysuria, flank pain, frequency, hematuria and urgency.  Musculoskeletal:  Negative for arthralgias, back pain and myalgias.     Physical Exam Triage Vital Signs ED Triage Vitals  Encounter Vitals Group     BP 08/23/24 1638 125/79     Girls Systolic BP Percentile --      Girls Diastolic BP Percentile --      Boys Systolic BP Percentile --      Boys Diastolic BP Percentile --      Pulse Rate 08/23/24 1638 72     Resp 08/23/24 1638 18     Temp 08/23/24 1638 98.2 F (36.8 C)     Temp Source 08/23/24 1638 Oral     SpO2 08/23/24 1638 96 %     Weight 08/23/24 1637 130 lb (59 kg)     Height 08/23/24 1637 5' 3 (1.6 m)     Head Circumference --      Peak Flow --      Pain Score 08/23/24 1634 4     Pain Loc --      Pain Education --      Exclude from Growth Chart --    No data found.  Updated Vital Signs BP 125/79 (BP Location: Right Arm)   Pulse 72   Temp 98.2 F (36.8 C) (Oral)   Resp 18   Ht 5' 3 (1.6 m)   Wt 130 lb (59 kg)   LMP 11/07/2002   SpO2 96%   BMI 23.03 kg/m   Visual Acuity Right Eye Distance:   Left Eye Distance:   Bilateral Distance:    Right Eye Near:   Left Eye Near:    Bilateral Near:     Physical Exam Vitals reviewed.   Constitutional:      General: She is awake. She is not in acute distress.    Appearance: Normal appearance. She is well-developed. She is not ill-appearing.     Comments: Very pleasant female appears stated age in no acute distress sitting comfortably in exam room  HENT:     Head: Normocephalic and atraumatic.     Mouth/Throat:     Pharynx: Uvula midline. No oropharyngeal exudate or posterior oropharyngeal erythema.  Cardiovascular:     Rate and Rhythm: Normal rate and regular rhythm.     Heart sounds: Normal heart sounds, S1 normal and S2 normal. No murmur heard. Pulmonary:     Effort: Pulmonary effort is normal.     Breath sounds: Normal breath sounds. No wheezing, rhonchi or rales.     Comments: Clear to auscultation bilaterally Abdominal:     General: Bowel sounds are normal.     Palpations: Abdomen is soft.     Tenderness: There is abdominal tenderness in the left lower quadrant. There is no right CVA tenderness, left CVA tenderness, guarding or rebound.     Comments: Mild tenderness palpation in left lower quadrant.  No evidence of acute abdomen on physical exam.  Psychiatric:        Behavior: Behavior is cooperative.      UC Treatments / Results  Labs (all labs ordered are listed, but only abnormal results are displayed) Labs Reviewed  POCT URINALYSIS DIP (MANUAL  ENTRY) - Abnormal; Notable for the following components:      Result Value   Ketones, POC UA trace (5) (*)    Blood, UA trace-intact (*)    Leukocytes, UA Trace (*)    All other components within normal limits  URINE CULTURE    EKG   Radiology No results found.  Procedures Procedures (including critical care time)  Medications Ordered in UC Medications - No data to display  Initial Impression / Assessment and Plan / UC Course  I have reviewed the triage vital signs and the nursing notes.  Pertinent labs & imaging results that were available during my care of the patient were reviewed by me and  considered in my medical decision making (see chart for details).     Patient is well-appearing, afebrile, nontoxic, nontachycardic.  Vital signs and physical exam are reassuring with no indication for emergent evaluation or imaging.  We did discuss that given her history of malignancy and current treatment plan that includes several medications that could cause her to be neutropenic it is reasonable to go to the hospital given her reported fever within the past few days, however, she is currently afebrile and reports that she is not had a fever recently and had blood work yesterday that showed a total white count of over 6K normal neutrophil count though I do not have access to these records.  We will start Augmentin that was prescribed by her gastroenterologist and encouraged her to take this twice daily for a minimum of 7 days.  Basic blood work was not repeated since it was normal yesterday.  She did have a UA that showed trace blood and leukocytes that she does not have significant urinary symptoms.  Will send this for culture but defer antibiotics until the culture results are available.  We discussed that given her history it is reasonable go to the hospital but since she is improving and does not currently have a fever we will start the antibiotics outpatient with close follow-up but we discussed that if anything worsens and she develops severe abdominal pain, changes in her bowel habits, recurrent fever, nausea/vomiting she would need to go to the ER.  Strict return precautions given.  All questions answered to patient satisfaction and she expressed understanding and agreement with treatment plan.  Final Clinical Impressions(s) / UC Diagnoses   Final diagnoses:  LLQ abdominal pain  Lower abdominal pain     Discharge Instructions      Your urine did have a little bit of white blood cells and a little bit of blood.  We are sending this for culture and we will contact you if this is abnormal  and we need to change her treatment plan.  I am more concerned about diverticulitis as we discussed.  Start the Augmentin that was prescribed by your gastroenterologist.  Take this twice a day for a week.  Follow-up with your primary care first thing next week.  If you have worsening abdominal pain, fever, nausea/vomiting, changes in your bowel habits you need to go to the ER as we discussed.     ED Prescriptions   None    PDMP not reviewed this encounter.   Sherrell Rocky POUR, PA-C 08/23/24 1746

## 2024-08-23 NOTE — Telephone Encounter (Signed)
 Pt c/o medication issue:  1. Name of Medication: Evolocumab  (REPATHA  SURECLICK) 140 MG/ML SOAJ   2. How are you currently taking this medication (dosage and times per day)?    3. Are you having a reaction (difficulty breathing--STAT)? no  4. What is your medication issue? Patient think she is having allergic reaction to the medication. She states she doesn't feel well as yall. Please advise

## 2024-08-25 LAB — URINE CULTURE: Culture: >=100000 — AB

## 2024-08-26 ENCOUNTER — Encounter: Payer: Self-pay | Admitting: Family Medicine

## 2024-08-26 ENCOUNTER — Ambulatory Visit (HOSPITAL_COMMUNITY): Payer: Self-pay

## 2024-08-26 MED ORDER — NITROFURANTOIN MONOHYD MACRO 100 MG PO CAPS: 100.0000 mg | ORAL_CAPSULE | Freq: Two times a day (BID) | ORAL | 0 refills | Status: AC

## 2024-08-28 ENCOUNTER — Encounter (HOSPITAL_BASED_OUTPATIENT_CLINIC_OR_DEPARTMENT_OTHER): Payer: Self-pay

## 2024-08-29 ENCOUNTER — Other Ambulatory Visit: Payer: Self-pay

## 2024-08-29 NOTE — Telephone Encounter (Signed)
 Repatha  is different than the mAB she received from cancer center, but ok to take pre-meds if she would like.

## 2024-08-29 NOTE — Telephone Encounter (Signed)
 Called pt to relay message from pharmacy. No answer, left message on pt's voicemail.

## 2024-09-18 ENCOUNTER — Ambulatory Visit (HOSPITAL_BASED_OUTPATIENT_CLINIC_OR_DEPARTMENT_OTHER)

## 2024-09-25 ENCOUNTER — Ambulatory Visit (HOSPITAL_BASED_OUTPATIENT_CLINIC_OR_DEPARTMENT_OTHER): Admitting: Pulmonary Disease

## 2024-09-25 ENCOUNTER — Other Ambulatory Visit: Payer: Self-pay

## 2024-09-27 MED ORDER — AMLODIPINE BESYLATE 5 MG PO TABS: 7.5000 mg | ORAL_TABLET | Freq: Every day | ORAL | 3 refills | Status: DC

## 2024-10-14 ENCOUNTER — Encounter (HOSPITAL_BASED_OUTPATIENT_CLINIC_OR_DEPARTMENT_OTHER): Payer: Self-pay | Admitting: Pulmonary Disease

## 2024-10-14 ENCOUNTER — Ambulatory Visit (HOSPITAL_BASED_OUTPATIENT_CLINIC_OR_DEPARTMENT_OTHER): Admitting: Pulmonary Disease

## 2024-10-14 ENCOUNTER — Ambulatory Visit (INDEPENDENT_AMBULATORY_CARE_PROVIDER_SITE_OTHER): Admitting: Pulmonary Disease

## 2024-10-14 VITALS — BP 134/70 | HR 68 | Ht 63.0 in | Wt 137.0 lb

## 2024-10-14 DIAGNOSIS — C8490 Mature T/NK-cell lymphomas, unspecified, unspecified site: Secondary | ICD-10-CM

## 2024-10-14 DIAGNOSIS — R918 Other nonspecific abnormal finding of lung field: Secondary | ICD-10-CM

## 2024-10-14 DIAGNOSIS — I10 Essential (primary) hypertension: Secondary | ICD-10-CM

## 2024-10-14 DIAGNOSIS — R911 Solitary pulmonary nodule: Secondary | ICD-10-CM

## 2024-10-14 NOTE — Progress Notes (Signed)
 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 Stopped methotrexate and IFN when blood tumor burden increased and was started on 05/25/20-09/14/20 with Brentuximab for 6 cycles however disease progreassed so started moguamulizumab and high dose prednisone  11/09/20 to 03/2022 but had bad reaction after over a year of this. Pegasys (IFN) were started. Currently on Targretin daily since 11/2022 and Pegasys weekly.  Overall no significant respiratory issues except for occasional cough from drier environments. No wheezing or shortness of breath. She works out three times a week with intense pilates, able to walk 2 miles every once in awhile.   She had abdominal pain and had CT scan on 07/04/23 that incidentally demonstrated RML scarring scarring or atelectasis and 3 mm RUL lung nodule.   10/14/24 Since our last visit denies shortness of breath, coughing or wheezing. Had CT chest in 06/2024 and mychart message sent. No limitation in activity including working out 3-4 times a week with pilates/weight bearing exercises and walking twice a week.  Social History: Never smoker  Environmental exposures: Chemotherapy  Past Medical History:  Diagnosis Date   Adenomatous polyp    Cancer (HCC)    cutaneous T-cell lymphoma; followed by Dr.  Joan @ [facility]   Diverticula of colon    Family history of anesthesia complication    sister and sister's children postitive for MH!!!!!!, pt tested negative 3-59yrs ago   GERD (gastroesophageal reflux disease)    History of hypertension    off medication after weight loss   Hyperlipemia    Lymphoma (HCC)    light box therapy   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HCC)    Osteopenia    bilateral hips   Rectal cancer (HCC) 04/2017     Family History  Problem Relation Age of Onset   Heart attack Mother    Heart disease Mother    Kidney disease Mother    Kidney cancer Mother        contained tumor   Hypertension Mother    Anuerysm Father    Heart disease Father    Diabetes Father    Cancer Father        unknown type   Hypertension Father    Hypertension Sister    Heart attack Brother    Cancer Brother        liver cancer   Melanoma Brother    Other Brother        Bypass surgery    Ovarian cancer Paternal Grandmother        was PMP   Heart attack Nephew    Colon cancer Neg Hx      Social History   Occupational History   Not on file  Tobacco Use   Smoking status: Never   Smokeless tobacco: Never  Vaping Use   Vaping status: Never Used  Substance and Sexual Activity  Alcohol use: Not Currently    Alcohol/week: 0.0 - 1.0 standard drinks of alcohol   Drug use: No   Sexual activity: Not Currently    Partners: Male    Birth control/protection: Post-menopausal    Allergies  Allergen Reactions   Ciprofloxacin Other (See Comments)    Gluteus medius tear  Gluteus medius tear    Muscle tear   Pravastatin      myalgia   Pseudoephedrine Hcl Palpitations   Rosuvastatin     myalgia   Sudafed [Pseudoephedrine Hcl] Palpitations     Outpatient Medications Prior to Visit  Medication Sig Dispense Refill   amLODipine  (NORVASC ) 5 MG tablet Take 1.5 tablets (7.5 mg total) by mouth daily. 135 tablet 3   B Complex Vitamins (VITAMIN B COMPLEX) TABS Take by mouth. 5000 UT     bexarotene  (TARGRETIN) 75 MG CAPS capsule Take 75 mg by mouth daily.     cetirizine (ZYRTEC) 10 MG tablet Take 10 mg by mouth daily.     Cholecalciferol (VITAMIN D ) 125 MCG (5000 UT) CAPS      Cranberry 1000 MG CAPS Take by mouth.     Evolocumab  (REPATHA  SURECLICK) 140 MG/ML SOAJ Inject 140 mg into the skin every 14 (fourteen) days. 6 mL 3   famotidine  (PEPCID ) 20 MG tablet Take 20 mg by mouth as needed.     Mechlorethamine HCl (VALCHLOR) 0.016 % GEL Apply 1 Application topically.     Menaquinone-7 (K2) 90 MCG/0.5ML LIQD Take 180 mcg by mouth daily.     metoprolol  succinate (TOPROL -XL) 25 MG 24 hr tablet TAKE 1/2 TABLET(12.5 MG) BY MOUTH DAILY 45 tablet 3   Probiotic Product (PROBIOTIC DAILY PO) Take by mouth.     tacrolimus (PROTOPIC) 0.1 % ointment as needed.     triamcinolone cream (KENALOG) 0.1 % as needed.     No facility-administered medications prior to visit.    Review of Systems  Constitutional:  Negative for chills, diaphoresis, fever, malaise/fatigue and weight loss.  HENT:  Negative for congestion.   Respiratory:  Negative for cough, hemoptysis, sputum production, shortness of breath and wheezing.   Cardiovascular:  Negative for chest pain, palpitations and leg swelling.     Objective:   Vitals:   10/14/24 1143  BP: 134/70  Pulse: 68  SpO2: 97%  Weight: 137 lb (62.1 kg)  Height: 5' 3 (1.6 m)    SpO2: 97 % Physical Exam: General: Well-appearing, no acute distress HENT: [place], AT Eyes: EOMI, no scleral icterus Respiratory: Clear to auscultation bilaterally.  No crackles, wheezing or rales Cardiovascular: RRR, -M/R/G, no JVD Extremities:-Edema,-tenderness Neuro: AAO x4, CNII-XII grossly intact Psych: Normal mood, normal affect  Data Reviewed:  Imaging: CT CAP 07/04/23 - Lung and pleura: There is mild right middle lobe scarring or atelectasis. There is a 3 mm right upper lobe nodule on image 146 of series 5. The lungs are otherwise clear. The central airways are patent.  There is no pleural effusion or pneumothorax.   CT Chest 06/19/24 - No acute intrathoracic pathology. Similar right nodule  PFT: None of file  Labs:    Latest Ref Rng & Units 04/19/2024   10:03 AM 03/20/2024    9:18 AM 02/14/2024    9:41 AM  CBC  WBC 4.0 - 10.5 K/uL 2.9  2.4  2.4   Hemoglobin 12.0 - 15.0 g/dL 86.8  87.0  87.4   Hematocrit 36.0 - 46.0 % 36.1  36.8  35.9   Platelets 150 - 400  K/uL 162  159  158       Latest Ref Rng & Units 04/19/2024   10:03 AM 03/20/2024    9:18 AM 02/14/2024    9:41 AM  BMP  Glucose 70 - 99 mg/dL 81  97  94   BUN 8 - 23 mg/dL 14  19  17    Creatinine 0.44 - 1.00 mg/dL 9.02  9.06  9.08   Sodium 135 - 145 mmol/L 137  138  137   Potassium 3.5 - 5.1 mmol/L 4.2  4.2  3.9   Chloride 98 - 111 mmol/L 103  104  103   CO2 22 - 32 mmol/L 25  28  30    Calcium 8.9 - 10.3 mg/dL 9.3  9.3  9.4       Latest Ref Rng & Units 04/19/2024   10:03 AM 03/20/2024    9:18 AM 02/14/2024    9:41 AM  Hepatic Function  Total Protein 6.5 - 8.1 g/dL 8.1  8.1  8.3   Albumin 3.5 - 5.0 g/dL 4.2  4.4  4.4   AST 15 - 41 U/L 27  21  18    ALT 0 - 44 U/L 17  13  11    Alk Phosphatase 38 - 126 U/L 78  71  64   Total Bilirubin 0.0 - 1.2 mg/dL 0.8  0.6  0.6         Assessment & Plan:   Discussion: 73 year old female never smoker with 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 [facility] solitary nodule risk calculated to be 17% in setting of prior/active malignancy and upper lobe nodule. Low-intermediate risk for malignancy. Discussed repeat surveillance imaging to ensure stability at 2 year mark. Atelectasis is minimal and not likely drug induced changes. Will continue to monitor  RUL lung nodule 3 mm - likely benign based on size however intermediate risk for malignancy Mild RML scarring/atelectasis  --Reviewed CT Chest 06/19/24. Stable lung nodule.  --Follow-up in 1 year August 2026 per Fleischner's  guidelines  Health Maintenance Immunization History  Administered Date(s) Administered   DT (Pediatric) 07/19/2009   PFIZER Comirnaty(Gray Top)Covid-19 Tri-Sucrose Vaccine 12/13/2019, 01/10/2020, 10/19/2020   PFIZER(Purple Top)SARS-COV-2 Vaccination 12/13/2019, 01/10/2020, 10/19/2020   Pneumococcal Polysaccharide-23 08/19/2019   Zoster Recombinant(Shingrix) 11/12/2019, 07/03/2024   CT Lung Screen - never smoker. Not qualified  Orders Placed This Encounter  Procedures   CT Chest Wo Contrast    Standing Status:   Future    Expiration Date:   10/14/2025    Scheduling Instructions:     Schedule in 06/2025    Preferred imaging location?:   [facility]  No orders of the defined types were placed in this encounter.   Return in about 9 months (around 07/15/2025) for after CT scan.  I have spent a total time of 30-minutes on the day of the appointment including chart review, data review, collecting history, coordinating care and discussing medical diagnosis and plan with the patient/family. Past medical history, allergies, medications were reviewed. Pertinent imaging, labs and tests included in this note have been reviewed and interpreted independently by me.  Amado Andal Slater Staff, MD  Pulmonary Critical Care 10/14/2024 11:54 AM

## 2024-10-14 NOTE — Patient Instructions (Signed)
 RUL lung nodule 3 mm - likely benign based on size however intermediate risk for malignancy Mild RML scarring/atelectasis  --Reviewed CT Chest 06/19/24. Stable lung nodule.  --Follow-up in 1 year August 2026 per Fleischner's guidelines

## 2024-11-21 ENCOUNTER — Telehealth (HOSPITAL_BASED_OUTPATIENT_CLINIC_OR_DEPARTMENT_OTHER): Payer: Self-pay | Admitting: Cardiology

## 2024-11-21 NOTE — Telephone Encounter (Signed)
 Sent mychart message asking to clarify her bp meds, since she is in a class until 11:00 am today.

## 2024-11-21 NOTE — Telephone Encounter (Signed)
 Pt c/o BP issue: STAT if pt c/o blurred vision, one-sided weakness or slurred speech.  STAT if BP is GREATER than 180/120 TODAY.  STAT if BP is LESS than 90/60 and SYMPTOMATIC TODAY  1. What is your BP concern?   See below  2. Have you taken any BP medication today?  Yes  3. What are your last 5 BP readings?  160/87  this morning (a couple of hours after taking BP meds) 165/89 - yesterday 180-something/93 - day before  4. Are you having any other symptoms (ex. Dizziness, headache, blurred vision, passed out)?   Headache  Patient is concerned her BP has been trending high.  Patient stated her HR is at a normal rate.  Patient want to know if she should increase her medication.  Patient noted she will be in a class from 9:00 - 11:30 am.

## 2024-11-29 ENCOUNTER — Encounter (HOSPITAL_BASED_OUTPATIENT_CLINIC_OR_DEPARTMENT_OTHER): Payer: Self-pay | Admitting: Cardiology

## 2024-11-29 MED ORDER — AMLODIPINE BESYLATE 5 MG PO TABS: 10.0000 mg | ORAL_TABLET | Freq: Every day | ORAL | Status: DC

## 2024-11-29 NOTE — Addendum Note (Signed)
 Addended by: FREDIRICK BEAU B on: 11/29/2024 01:20 PM   Modules accepted: Orders

## 2024-11-29 NOTE — Telephone Encounter (Signed)
 Pt c/o BP issue: STAT if pt c/o blurred vision, one-sided weakness or slurred speech.  STAT if BP is GREATER than 180/120 TODAY.  STAT if BP is LESS than 90/60 and SYMPTOMATIC TODAY  1. What is your BP concern?   Patient is concerned her BP has been spiking.  2. Have you taken any BP medication today?  Unknown  3. What are your last 5 BP readings?  176/100  4. Are you having any other symptoms (ex. Dizziness, headache, blurred vision, passed out)?   Unknown   Patient is concerned her BP has been spiking and believes it may be anxiety related.  Patient wants a call back regarding medication options.  Note - call abruptly disconnected.

## 2024-11-29 NOTE — Telephone Encounter (Signed)
 Advised patient, verbalized understanding  2 hours after medications this morning blood pressure 167/90 Denies eating anything differently   Is going to increase her Amlodipine  to 10 mg daily and monitor  Scheduled visit 2/6   Will monitor her blood pressure and call back if any further issues

## 2024-11-29 NOTE — Telephone Encounter (Signed)
 See duplicate Mychart encounter as well.  If BP routinely >130/80, increase Amlodipine  to 10mg  daily. If BP most often <130/80 with only occasional spikes, continue Amlodipine  7.5mg  daily and Rx Hydralazine 25mg  PRN for SBP >170. May take up to 3x per day.   Given persistent issues with elevated BP, would recommend sooner follow up with Dr. Lonni or APP.  Alexa [name] S Kaedyn Polivka, NP

## 2024-11-30 ENCOUNTER — Other Ambulatory Visit: Payer: Self-pay

## 2024-12-10 ENCOUNTER — Ambulatory Visit: Admitting: Family Medicine

## 2024-12-10 ENCOUNTER — Ambulatory Visit (HOSPITAL_COMMUNITY)

## 2024-12-10 VITALS — BP 117/69 | HR 77 | Temp 98.5°F | Ht 63.0 in | Wt 138.2 lb

## 2024-12-10 DIAGNOSIS — J0110 Acute frontal sinusitis, unspecified: Secondary | ICD-10-CM | POA: Diagnosis present

## 2024-12-10 NOTE — Progress Notes (Signed)
" ° ° °  SUBJECTIVE:   CHIEF COMPLAINT / HPI: Fever, sinus pressure  Discussed the use of AI scribe software for clinical note transcription with the patient, who gave verbal consent to proceed.  History of Present Illness Alexa [name] is a 74 year old female with lymphoma who presents with symptoms suggestive of a sinus infection.  Acute sinonasal symptoms - Onset Sunday night after working outside in the snow - Initial symptoms resembled allergies, progressing to severe sinus pressure, sneezing, and rhinorrhea - Sinus pressure described as severe, worse with leaning forward, localized above the eyebrows - Sinus pain above the eyebrows has lessened since this morning - Symptoms fluctuate in intensity - Congestion present - Occasional very dry cough attributed to dehydration - No significant cough - No shortness of breath  Fever - Fever up to 101.16F - Fever improved with Tylenol  - Temperature normalized by 6 AM today - Concern regarding fever >100F due to immunosuppression  Immunosuppression and lymphoma - History of lymphoma treated with cancer medications for seven years - Current therapy lowers immune system function - Concern for incomplete white blood cell recovery  Medication use and response - Using 1000 mg Tylenol , guaifenesin, neti pot, and Flonase (used on Monday) - Fever and sinus pressure improve with Tylenol  - Took scheduled Repatha  injection on Sunday - Concern for possible flu-like side effects from Repatha , though no prior such reactions in 3-4 months of use  History of sinus infections - Previous sinus infections, but none recently - Does not recall prior treatments for sinus infections  Antibiotic availability - Has existing antibiotic prescription from GI doctor, initially provided for diverticulitis  Gastrointestinal and respiratory symptoms - No nausea, vomiting, or diarrhea    PERTINENT  PMH / PSH: Hypertension, CAD, CRC, T-cell  lymphoma  OBJECTIVE:   BP 117/69   Pulse 77   Temp 98.5 F (36.9 C) (Oral)   Ht 5' 3 (1.6 m)   Wt 138 lb 4 oz (62.7 kg)   LMP 11/07/2002   SpO2 100%   BMI 24.49 kg/m   Physical Exam General: NAD, well appearing Neuro: A&O HEENT: moist mucous membranes, no posterior oropharyngeal erythema, no signs of tonsillar adenitis or exudates, bilateral nasal turbinates noninflamed, no rhinorrhea, external auditory canals normal Cardiovascular: RRR, no murmurs,  Respiratory: normal WOB on RA, CTAB, no wheezes, ronchi or rales Abdomen: soft, NTTP, no rebound or guarding Extremities: Moving all 4 extremities equally, no peripheral edema   ASSESSMENT/PLAN:   Assessment & Plan Acute non-recurrent frontal sinusitis Likely viral etiology, no obvious signs of pneumonia, strep, or bacterial sinusitis.  Given immunocompromise status, it is reasonable to empirically treat with antibiotics, discussed with patient who with prefer conservative management and then to treat if symptoms lasting longer than 7 days.  This is reasonable. - Continue acetaminophen  1000 mg every six hours as needed for fever and symptom relief. - Monitor symptoms and fever. If fever persists beyond seven days or symptoms worsen, consider starting Augmentin for five days, directed to call our clinic if she plans to do this - She has Augmentin 875-125 at home for recurrent diverticulitis per her GI doctor - Use guaifenesin, neti pot, and Flonase as needed for symptom management. - Wear a mask and practice hand hygiene to prevent contagion.    Return if symptoms worsen or fail to improve.  Ozell Provencal, MD, PGY-3 [facility] 11:16 AM 12/10/2024  [facility]   "

## 2024-12-10 NOTE — Patient Instructions (Addendum)
 It was great to see you! Thank you for allowing me to participate in your care!  Our plans for today:   VISIT SUMMARY: During your visit, we discussed your symptoms of a sinus infection, including severe sinus pressure, fever, and congestion. We also reviewed your immunocompromised state due to lymphoma treatment and your current medications.  YOUR PLAN: ACUTE SINUSITIS: Your symptoms suggest a viral sinus infection, but your immunocompromised state increases the risk of bacterial infection. -Continue taking acetaminophen  1000 mg every six hours as needed for fever and symptom relief. -Monitor your symptoms and fever. If your fever persists beyond seven days or your symptoms worsen, consider starting Augmentin for five days. - If you think that you need to take the antibiotics, please call our clinic so we are aware, and can direct you on the appropriate length of time and dose and whether or not you need to see us  in clinic again -Use guaifenesin, a neti pot, and Flonase as needed for symptom management. -Wear a mask and practice hand hygiene to prevent spreading the infection.  IMMUNOCOMPROMISED STATE DUE TO LYMPHOMA TREATMENT: Your long-term cancer treatment for lymphoma has lowered your immune system function, making you more susceptible to infections. -Monitor for signs of prolonged fever or worsening symptoms, which may indicate the need for antibiotics. -Continue with the current management plan for your sinusitis with a conservative approach.    Contains text generated by [employer].    Please arrive 15 minutes PRIOR to your next scheduled appointment time! If you do not, this affects OTHER patients' care.  Take care and seek immediate care sooner if you develop any concerns.   Ozell Provencal, MD, PGY-3 [facility] 10:55 AM 12/10/2024  [facility]

## 2024-12-13 ENCOUNTER — Encounter (HOSPITAL_BASED_OUTPATIENT_CLINIC_OR_DEPARTMENT_OTHER): Payer: Self-pay | Admitting: Cardiology

## 2024-12-13 ENCOUNTER — Ambulatory Visit (HOSPITAL_BASED_OUTPATIENT_CLINIC_OR_DEPARTMENT_OTHER): Admitting: Cardiology

## 2024-12-13 VITALS — BP 122/74 | HR 72 | Ht 64.0 in | Wt 139.0 lb

## 2024-12-13 DIAGNOSIS — E7800 Pure hypercholesterolemia, unspecified: Secondary | ICD-10-CM

## 2024-12-13 DIAGNOSIS — I2510 Atherosclerotic heart disease of native coronary artery without angina pectoris: Secondary | ICD-10-CM

## 2024-12-13 DIAGNOSIS — I10 Essential (primary) hypertension: Secondary | ICD-10-CM

## 2024-12-13 DIAGNOSIS — Z7189 Other specified counseling: Secondary | ICD-10-CM

## 2024-12-13 DIAGNOSIS — T466X5A Adverse effect of antihyperlipidemic and antiarteriosclerotic drugs, initial encounter: Secondary | ICD-10-CM

## 2024-12-13 MED ORDER — AMLODIPINE BESYLATE 10 MG PO TABS: 10.0000 mg | ORAL_TABLET | Freq: Every day | ORAL | 3 refills | Status: AC

## 2024-12-13 NOTE — Patient Instructions (Signed)
 Medication Instructions:  Change amlodipine  to 10 mg - one tablet daily  *If you need a refill on your cardiac medications before your next appointment, please call your pharmacy*  Lab Work: Fasting lipid panel in 6 months before your next visit with Dr. Lonni --here or w your [facility] labs  If you have labs (blood work) drawn today and your tests are completely normal, you will receive your results only by: MyChart Message (if you have MyChart) OR A paper copy in the mail If you have any lab test that is abnormal or we need to change your treatment, we will call you to review the results.  Testing/Procedures: none  Follow-Up: At [facility], you and your health needs are our priority.  As part of our continuing mission to provide you with exceptional heart care, our providers are all part of one team.  This team includes your primary Cardiologist (physician) and Advanced Practice Providers or APPs (Physician Assistants and Nurse Practitioners) who all work together to provide you with the care you need, when you need it.  Your next appointment:   6 month(s)  Provider:   Shelda Lonni, MD, Rosaline Bane, NP, or Reche Finder, NP

## 2024-12-13 NOTE — Progress Notes (Unsigned)
 " Cardiology Office Note:  .   Date:  12/13/2024  ID:  Alexa [name], DOB Sep 21, 1951, MRN [id number] PCP: Donah Laymon PARAS, MD  [facility] Providers Cardiologist:  Shelda Bruckner, MD {  History of Present Illness: .   Alexa [name] is a 74 y.o. female with PMH hypertension, hyperlipidemia, palpitations, T cell lymphoma and colorectal cancer. She was previously followed by Dr. Ladona and Dr. Michele, transferred care to [facility] 09/21/23.  Pertinent CV history:Calcium score in 2022 was 34.2, which was 63rd percentile. She had declined statins, has tolerated ezetimibe .  Family history: mother, father, and two brothers have HTN. Two sisters have mildly elevated BP. Mother died age 6 of MI, had not had known CAD prior. Father in his 68s had MI, then had CHF later in life. Died from an aneurysm. Oldest brother had MI in his early 18s. Youngest brother had 4V CABG in his early 35s.   Lipid history:  Tchol 213, HDL 48, LDL 137, TG 142 in 08/2023 (on ezetimibe ) Stopped Zetia , started pravastatin  5 mg once a week Tchol 251, HDL 63, LDL 163, TG 123 10/20/23 Recheck on same pravastatin  dose of 5 mg once a week and one week of being back on the zetia  Tchol 228, HDL 52, LDL 155, TG 104 12/01/23  Today: Seen for elevated blood pressure. Notes that she was having a lot of anxiety before the big snowstorms, and then had someone stay with her for 5 days, overall felt stressed. Was having some headaches as well. Went up to the 10 mg dose of amlodipine  and saw significant improvement. No lows. No leg swelling at all. No palpitations during this time.   Did her repatha  injection on a Sunday, then went out and worked in the snow. Felt allergy-like symptoms later than day and the next day, had a fever of 101 the next day. Went to doctor, thought to be viral. We discussed potential repatha  side effects, usually more of a runny nose but can rarely be more severe. She will watch and see if  it happens with the next dose. Looked [facility] her labs at [facility].  Exercising but has been more sedentary recently. Reviewed lifestyle recommendations.  ROS: Denies chest pain, shortness of breath at rest or with normal exertion. No PND, orthopnea, LE edema or unexpected weight gain. No syncope or palpitations. ROS otherwise negative except as noted.   Studies Reviewed: SABRA    EKG:       Physical Exam:   VS:  BP 122/74   Pulse 72   Ht 5' 4 (1.626 m)   Wt 139 lb (63 kg)   LMP 11/07/2002   SpO2 96%   BMI 23.86 kg/m    Wt Readings from Last 3 Encounters:  12/13/24 139 lb (63 kg)  12/10/24 138 lb 4 oz (62.7 kg)  10/14/24 137 lb (62.1 kg)    GEN: Well nourished, well developed in no acute distress HEENT: Normal, moist mucous membranes NECK: No JVD CARDIAC: regular rhythm, normal S1 and S2, no rubs or gallops. No murmur. VASCULAR: Radial and DP pulses 2+ bilaterally. No carotid bruits RESPIRATORY:  Clear to auscultation without rales, wheezing or rhonchi  ABDOMEN: Soft, non-tender, non-distended MUSCULOSKELETAL:  Ambulates independently SKIN: Warm and dry, no edema NEUROLOGIC:  Alert and oriented x 3. No focal neuro deficits noted. PSYCHIATRIC:  Normal affect    ASSESSMENT AND PLAN: .    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 Has not tolerated rosuvastatin or pravastatin  -see prior discussion re: nexletol, concerns -now on PCSK9i and doing well. LDL down to 83, goal is <70 but this is much improved from prior. She has not had any issues with ezetimibe  in the past, we discussed restarting this, after shared decision making will wait until the next lab check and then decide about the ezetimibe  if not at goal. -we discussed lipoprotein a testing today. She will think about this but feels it would likely make her anxious if  she knew it was high and there was not a targeted therapy.  Fatty liver History of myosis fungoides -stopped pegasys, still on targretin  Palpitations -doing well on metoprolol , no recent significant symptoms  CV risk counseling and prevention -recommend heart healthy/Mediterranean diet, with whole grains, fruits, vegetable, fish, lean meats, nuts, and olive oil. Limit salt. -recommend moderate walking, 3-5 times/week for 30-50 minutes each session. Aim for at least 150 minutes.week. Goal should be pace of 3 miles/hours, or walking 1.5 miles in 30 minutes -recommend avoidance of tobacco products. Avoid excess alcohol.  Dispo: 6 mos or sooner as needed  Signed, Shelda Bruckner, MD   Shelda Bruckner, MD, PhD, [facility]  [facility]  [place]  [facility] at [facility] at [facility] [address], [address] [place], [place] [address] [phone]   "
